# Patient Record
Sex: Female | Born: 1955
Health system: Southern US, Community
[De-identification: ages and names within clinical notes are randomized; demographics above are authoritative.]

## PROBLEM LIST (undated history)

## (undated) DIAGNOSIS — E782 Mixed hyperlipidemia: Secondary | ICD-10-CM

## (undated) DIAGNOSIS — I10 Essential (primary) hypertension: Secondary | ICD-10-CM

## (undated) DIAGNOSIS — M545 Low back pain, unspecified: Secondary | ICD-10-CM

## (undated) DIAGNOSIS — D509 Iron deficiency anemia, unspecified: Secondary | ICD-10-CM

## (undated) DIAGNOSIS — C801 Malignant (primary) neoplasm, unspecified: Secondary | ICD-10-CM

## (undated) DIAGNOSIS — K219 Gastro-esophageal reflux disease without esophagitis: Secondary | ICD-10-CM

## (undated) DIAGNOSIS — K222 Esophageal obstruction: Secondary | ICD-10-CM

## (undated) DIAGNOSIS — F329 Major depressive disorder, single episode, unspecified: Secondary | ICD-10-CM

## (undated) DIAGNOSIS — A048 Other specified bacterial intestinal infections: Secondary | ICD-10-CM

## (undated) DIAGNOSIS — G8929 Other chronic pain: Secondary | ICD-10-CM

## (undated) DIAGNOSIS — K449 Diaphragmatic hernia without obstruction or gangrene: Secondary | ICD-10-CM

## (undated) DIAGNOSIS — E119 Type 2 diabetes mellitus without complications: Secondary | ICD-10-CM

## (undated) DIAGNOSIS — G459 Transient cerebral ischemic attack, unspecified: Secondary | ICD-10-CM

## (undated) DIAGNOSIS — I8390 Asymptomatic varicose veins of unspecified lower extremity: Secondary | ICD-10-CM

## (undated) DIAGNOSIS — F32A Depression, unspecified: Secondary | ICD-10-CM

## (undated) DIAGNOSIS — M1990 Unspecified osteoarthritis, unspecified site: Secondary | ICD-10-CM

## (undated) DIAGNOSIS — F419 Anxiety disorder, unspecified: Secondary | ICD-10-CM

## (undated) DIAGNOSIS — R768 Other specified abnormal immunological findings in serum: Secondary | ICD-10-CM

## (undated) DIAGNOSIS — R109 Unspecified abdominal pain: Secondary | ICD-10-CM

## (undated) HISTORY — DX: Low back pain: M54.5

## (undated) HISTORY — DX: Depression, unspecified: F32.A

## (undated) HISTORY — DX: Type 2 diabetes mellitus without complications: E11.9

## (undated) HISTORY — DX: Diaphragmatic hernia without obstruction or gangrene: K44.9

## (undated) HISTORY — DX: Major depressive disorder, single episode, unspecified: F32.9

## (undated) HISTORY — DX: Asymptomatic varicose veins of unspecified lower extremity: I83.90

## (undated) HISTORY — PX: ESOPHAGOGASTRODUODENOSCOPY: SHX1529

## (undated) HISTORY — DX: Anxiety disorder, unspecified: F41.9

## (undated) HISTORY — DX: Transient cerebral ischemic attack, unspecified: G45.9

## (undated) HISTORY — DX: Esophageal obstruction: K22.2

## (undated) HISTORY — DX: Other specified bacterial intestinal infections: A04.8

## (undated) HISTORY — PX: SPINE SURGERY: SHX786

## (undated) HISTORY — DX: Mixed hyperlipidemia: E78.2

## (undated) HISTORY — DX: Unspecified osteoarthritis, unspecified site: M19.90

## (undated) HISTORY — DX: Iron deficiency anemia, unspecified: D50.9

## (undated) HISTORY — DX: Other specified abnormal immunological findings in serum: R76.8

## (undated) HISTORY — DX: Essential (primary) hypertension: I10

## (undated) HISTORY — DX: Low back pain, unspecified: M54.50

## (undated) HISTORY — DX: Gastro-esophageal reflux disease without esophagitis: K21.9

---

## 1992-10-16 HISTORY — PX: TUBAL LIGATION: SHX77

## 1998-04-12 ENCOUNTER — Ambulatory Visit (HOSPITAL_COMMUNITY): Admission: RE | Admit: 1998-04-12 | Discharge: 1998-04-12 | Payer: Self-pay | Admitting: Neurology

## 2000-05-15 ENCOUNTER — Encounter: Payer: Self-pay | Admitting: Neurosurgery

## 2000-05-15 ENCOUNTER — Ambulatory Visit (HOSPITAL_COMMUNITY): Admission: RE | Admit: 2000-05-15 | Discharge: 2000-05-15 | Payer: Self-pay | Admitting: Neurosurgery

## 2000-05-29 ENCOUNTER — Ambulatory Visit (HOSPITAL_COMMUNITY): Admission: RE | Admit: 2000-05-29 | Discharge: 2000-05-29 | Payer: Self-pay | Admitting: Neurosurgery

## 2000-05-29 ENCOUNTER — Encounter: Payer: Self-pay | Admitting: Neurosurgery

## 2000-06-12 ENCOUNTER — Ambulatory Visit (HOSPITAL_COMMUNITY): Admission: RE | Admit: 2000-06-12 | Discharge: 2000-06-12 | Payer: Self-pay | Admitting: Neurosurgery

## 2000-06-12 ENCOUNTER — Encounter: Payer: Self-pay | Admitting: Neurosurgery

## 2000-07-16 HISTORY — PX: OTHER SURGICAL HISTORY: SHX169

## 2000-07-26 ENCOUNTER — Encounter: Payer: Self-pay | Admitting: Neurosurgery

## 2000-07-26 ENCOUNTER — Emergency Department (HOSPITAL_COMMUNITY): Admission: EM | Admit: 2000-07-26 | Discharge: 2000-07-26 | Payer: Self-pay | Admitting: Emergency Medicine

## 2000-07-26 ENCOUNTER — Encounter: Payer: Self-pay | Admitting: Emergency Medicine

## 2000-07-30 ENCOUNTER — Encounter: Payer: Self-pay | Admitting: Neurosurgery

## 2000-07-30 ENCOUNTER — Observation Stay (HOSPITAL_COMMUNITY): Admission: RE | Admit: 2000-07-30 | Discharge: 2000-07-31 | Payer: Self-pay | Admitting: Neurosurgery

## 2001-05-21 ENCOUNTER — Other Ambulatory Visit: Admission: RE | Admit: 2001-05-21 | Discharge: 2001-05-21 | Payer: Self-pay | Admitting: Internal Medicine

## 2002-01-20 ENCOUNTER — Ambulatory Visit (HOSPITAL_COMMUNITY): Admission: RE | Admit: 2002-01-20 | Discharge: 2002-01-20 | Payer: Self-pay | Admitting: Family Medicine

## 2002-01-20 ENCOUNTER — Encounter: Payer: Self-pay | Admitting: Family Medicine

## 2002-04-22 ENCOUNTER — Ambulatory Visit (HOSPITAL_COMMUNITY): Admission: RE | Admit: 2002-04-22 | Discharge: 2002-04-22 | Payer: Self-pay | Admitting: Family Medicine

## 2002-04-22 ENCOUNTER — Encounter: Payer: Self-pay | Admitting: Family Medicine

## 2003-01-26 ENCOUNTER — Encounter: Payer: Self-pay | Admitting: Family Medicine

## 2003-01-26 ENCOUNTER — Ambulatory Visit (HOSPITAL_COMMUNITY): Admission: RE | Admit: 2003-01-26 | Discharge: 2003-01-26 | Payer: Self-pay | Admitting: Emergency Medicine

## 2004-01-27 ENCOUNTER — Ambulatory Visit (HOSPITAL_COMMUNITY): Admission: RE | Admit: 2004-01-27 | Discharge: 2004-01-27 | Payer: Self-pay | Admitting: Family Medicine

## 2004-09-15 ENCOUNTER — Encounter (INDEPENDENT_AMBULATORY_CARE_PROVIDER_SITE_OTHER): Payer: Self-pay | Admitting: Internal Medicine

## 2004-11-01 ENCOUNTER — Emergency Department (HOSPITAL_COMMUNITY): Admission: EM | Admit: 2004-11-01 | Discharge: 2004-11-01 | Payer: Self-pay | Admitting: Emergency Medicine

## 2005-02-02 ENCOUNTER — Ambulatory Visit (HOSPITAL_COMMUNITY): Admission: RE | Admit: 2005-02-02 | Discharge: 2005-02-02 | Payer: Self-pay | Admitting: Family Medicine

## 2005-04-23 ENCOUNTER — Emergency Department (HOSPITAL_COMMUNITY): Admission: EM | Admit: 2005-04-23 | Discharge: 2005-04-23 | Payer: Self-pay | Admitting: Emergency Medicine

## 2006-01-04 ENCOUNTER — Emergency Department (HOSPITAL_COMMUNITY): Admission: EM | Admit: 2006-01-04 | Discharge: 2006-01-04 | Payer: Self-pay | Admitting: Emergency Medicine

## 2006-01-14 HISTORY — PX: COLONOSCOPY: SHX174

## 2006-01-18 ENCOUNTER — Ambulatory Visit: Payer: Self-pay | Admitting: Internal Medicine

## 2006-01-31 ENCOUNTER — Ambulatory Visit: Payer: Self-pay | Admitting: Internal Medicine

## 2006-01-31 ENCOUNTER — Ambulatory Visit (HOSPITAL_COMMUNITY): Admission: RE | Admit: 2006-01-31 | Discharge: 2006-01-31 | Payer: Self-pay | Admitting: Internal Medicine

## 2006-02-05 ENCOUNTER — Ambulatory Visit (HOSPITAL_COMMUNITY): Admission: RE | Admit: 2006-02-05 | Discharge: 2006-02-05 | Payer: Self-pay | Admitting: Family Medicine

## 2006-06-12 ENCOUNTER — Ambulatory Visit: Payer: Self-pay | Admitting: Internal Medicine

## 2006-06-12 LAB — CONVERTED CEMR LAB
LDL Cholesterol: 128 mg/dL
Total CHOL/HDL Ratio: 4.3
VLDL: 21 mg/dL

## 2006-08-15 ENCOUNTER — Encounter (INDEPENDENT_AMBULATORY_CARE_PROVIDER_SITE_OTHER): Payer: Self-pay | Admitting: Internal Medicine

## 2006-08-15 ENCOUNTER — Emergency Department (HOSPITAL_COMMUNITY): Admission: EM | Admit: 2006-08-15 | Discharge: 2006-08-15 | Payer: Self-pay | Admitting: Emergency Medicine

## 2006-08-15 LAB — CONVERTED CEMR LAB
ALT: 14 units/L
AST: 23 units/L
Albumin: 3.6 g/dL
Alkaline Phosphatase: 131 units/L
BUN: 12 mg/dL
Basophils Absolute: 0 10*3/uL
Basophils Relative: 1 %
CO2: 32 meq/L
Calcium: 9.5 mg/dL
Chloride: 100 meq/L
Creatinine, Ser: 0.9 mg/dL
Eosinophils Absolute: 0.1 10*3/uL
Eosinophils Relative: 1 %
Glucose, Bld: 142 mg/dL
HCT: 37.5 %
Hemoglobin: 12.3 g/dL
Lymphocytes Relative: 21 %
Lymphs Abs: 1 10*3/uL
MCHC: 32.8 g/dL
MCV: 83.3 fL
Monocytes Absolute: 0.3 10*3/uL
Monocytes Relative: 7 %
Neutro Abs: 3.3 10*3/uL
Neutrophils Relative %: 70 %
Platelets: 292 10*3/uL
Potassium: 3.8 meq/L
RBC count: 4.51 10*6/uL
RBC: 4.51 M/uL
RDW: 15 %
Sodium: 138 meq/L
Total Bilirubin: 0.8 mg/dL
Total Protein: 7.5 g/dL
WBC, blood: 4.7 10*3/uL
WBC: 4.7 10*3/uL

## 2006-08-20 ENCOUNTER — Ambulatory Visit: Payer: Self-pay | Admitting: Internal Medicine

## 2006-08-21 ENCOUNTER — Ambulatory Visit (HOSPITAL_COMMUNITY): Admission: RE | Admit: 2006-08-21 | Discharge: 2006-08-21 | Payer: Self-pay | Admitting: Internal Medicine

## 2006-08-22 ENCOUNTER — Ambulatory Visit (HOSPITAL_COMMUNITY): Admission: RE | Admit: 2006-08-22 | Discharge: 2006-08-22 | Payer: Self-pay | Admitting: Internal Medicine

## 2006-09-19 ENCOUNTER — Ambulatory Visit (HOSPITAL_COMMUNITY): Admission: RE | Admit: 2006-09-19 | Discharge: 2006-09-19 | Payer: Self-pay | Admitting: Internal Medicine

## 2006-09-19 ENCOUNTER — Ambulatory Visit: Payer: Self-pay | Admitting: Cardiology

## 2006-09-25 ENCOUNTER — Encounter: Payer: Self-pay | Admitting: Internal Medicine

## 2006-09-25 DIAGNOSIS — E785 Hyperlipidemia, unspecified: Secondary | ICD-10-CM | POA: Insufficient documentation

## 2006-09-25 DIAGNOSIS — K219 Gastro-esophageal reflux disease without esophagitis: Secondary | ICD-10-CM | POA: Insufficient documentation

## 2006-09-25 DIAGNOSIS — M1990 Unspecified osteoarthritis, unspecified site: Secondary | ICD-10-CM | POA: Insufficient documentation

## 2006-09-25 DIAGNOSIS — F329 Major depressive disorder, single episode, unspecified: Secondary | ICD-10-CM

## 2006-09-25 DIAGNOSIS — G8929 Other chronic pain: Secondary | ICD-10-CM | POA: Insufficient documentation

## 2006-09-25 DIAGNOSIS — I10 Essential (primary) hypertension: Secondary | ICD-10-CM | POA: Insufficient documentation

## 2006-09-25 DIAGNOSIS — E1169 Type 2 diabetes mellitus with other specified complication: Secondary | ICD-10-CM | POA: Insufficient documentation

## 2006-09-25 DIAGNOSIS — H409 Unspecified glaucoma: Secondary | ICD-10-CM | POA: Insufficient documentation

## 2006-09-25 DIAGNOSIS — F3289 Other specified depressive episodes: Secondary | ICD-10-CM | POA: Insufficient documentation

## 2006-09-25 DIAGNOSIS — F411 Generalized anxiety disorder: Secondary | ICD-10-CM | POA: Insufficient documentation

## 2007-01-24 ENCOUNTER — Ambulatory Visit: Payer: Self-pay | Admitting: Internal Medicine

## 2007-01-24 DIAGNOSIS — E669 Obesity, unspecified: Secondary | ICD-10-CM

## 2007-01-25 LAB — CONVERTED CEMR LAB
Albumin: 3.9 g/dL (ref 3.5–5.2)
BUN: 11 mg/dL (ref 6–23)
Calcium: 8.9 mg/dL (ref 8.4–10.5)
Chloride: 102 meq/L (ref 96–112)
Glucose, Bld: 100 mg/dL — ABNORMAL HIGH (ref 70–99)
HDL: 37 mg/dL — ABNORMAL LOW (ref >39)
Lymphocytes Relative: 29 % (ref 12–46)
Lymphs Abs: 1.5 10*3/uL (ref 0.7–3.3)
Monocytes Relative: 9 % (ref 3–11)
Neutro Abs: 3.1 10*3/uL (ref 1.7–7.7)
Neutrophils Relative %: 59 % (ref 43–77)
Potassium: 4 meq/L (ref 3.5–5.3)
RBC: 4.34 M/uL (ref 3.87–5.11)
Sodium: 141 meq/L (ref 135–145)
Total Protein: 7.3 g/dL (ref 6.0–8.3)
Triglycerides: 101 mg/dL (ref <150)
WBC: 5.3 10*3/uL (ref 4.0–10.5)

## 2007-02-08 ENCOUNTER — Ambulatory Visit (HOSPITAL_COMMUNITY): Admission: RE | Admit: 2007-02-08 | Discharge: 2007-02-08 | Payer: Self-pay | Admitting: Internal Medicine

## 2007-02-28 ENCOUNTER — Telehealth (INDEPENDENT_AMBULATORY_CARE_PROVIDER_SITE_OTHER): Payer: Self-pay | Admitting: *Deleted

## 2007-03-29 ENCOUNTER — Emergency Department (HOSPITAL_COMMUNITY): Admission: EM | Admit: 2007-03-29 | Discharge: 2007-03-29 | Payer: Self-pay | Admitting: Emergency Medicine

## 2007-04-03 ENCOUNTER — Ambulatory Visit: Payer: Self-pay | Admitting: Internal Medicine

## 2007-05-02 ENCOUNTER — Ambulatory Visit: Payer: Self-pay | Admitting: Internal Medicine

## 2007-08-22 ENCOUNTER — Ambulatory Visit: Payer: Self-pay | Admitting: Internal Medicine

## 2007-08-22 LAB — CONVERTED CEMR LAB
Blood in Urine, dipstick: NEGATIVE
Glucose, Urine, Semiquant: NEGATIVE
Ketones, urine, test strip: NEGATIVE
Protein, U semiquant: NEGATIVE
Specific Gravity, Urine: 1.02
pH: 5.5

## 2007-08-23 ENCOUNTER — Telehealth (INDEPENDENT_AMBULATORY_CARE_PROVIDER_SITE_OTHER): Payer: Self-pay | Admitting: *Deleted

## 2007-08-23 LAB — CONVERTED CEMR LAB
BUN: 13 mg/dL (ref 6–23)
CO2: 26 meq/L (ref 19–32)
Calcium: 9.2 mg/dL (ref 8.4–10.5)
Chloride: 100 meq/L (ref 96–112)
Creatinine, Ser: 0.81 mg/dL (ref 0.40–1.20)
Glucose, Bld: 147 mg/dL — ABNORMAL HIGH (ref 70–99)
Potassium: 4 meq/L (ref 3.5–5.3)
Sodium: 139 meq/L (ref 135–145)

## 2007-09-23 ENCOUNTER — Ambulatory Visit: Payer: Self-pay | Admitting: Internal Medicine

## 2007-10-15 ENCOUNTER — Ambulatory Visit: Payer: Self-pay | Admitting: Internal Medicine

## 2007-10-15 ENCOUNTER — Telehealth (INDEPENDENT_AMBULATORY_CARE_PROVIDER_SITE_OTHER): Payer: Self-pay | Admitting: *Deleted

## 2007-10-22 ENCOUNTER — Ambulatory Visit: Admission: RE | Admit: 2007-10-22 | Discharge: 2007-10-22 | Payer: Self-pay | Admitting: Family Medicine

## 2007-10-22 ENCOUNTER — Encounter (INDEPENDENT_AMBULATORY_CARE_PROVIDER_SITE_OTHER): Payer: Self-pay | Admitting: Internal Medicine

## 2007-10-22 ENCOUNTER — Telehealth (INDEPENDENT_AMBULATORY_CARE_PROVIDER_SITE_OTHER): Payer: Self-pay | Admitting: Internal Medicine

## 2007-11-08 ENCOUNTER — Encounter (INDEPENDENT_AMBULATORY_CARE_PROVIDER_SITE_OTHER): Payer: Self-pay | Admitting: Internal Medicine

## 2007-11-12 ENCOUNTER — Encounter (INDEPENDENT_AMBULATORY_CARE_PROVIDER_SITE_OTHER): Payer: Self-pay | Admitting: Internal Medicine

## 2007-11-15 ENCOUNTER — Ambulatory Visit: Payer: Self-pay | Admitting: Internal Medicine

## 2007-11-15 LAB — CONVERTED CEMR LAB: Rapid Strep: NEGATIVE

## 2007-11-19 ENCOUNTER — Encounter (INDEPENDENT_AMBULATORY_CARE_PROVIDER_SITE_OTHER): Payer: Self-pay | Admitting: Internal Medicine

## 2008-02-11 ENCOUNTER — Ambulatory Visit (HOSPITAL_COMMUNITY): Admission: RE | Admit: 2008-02-11 | Discharge: 2008-02-11 | Payer: Self-pay | Admitting: Internal Medicine

## 2008-02-12 ENCOUNTER — Telehealth (INDEPENDENT_AMBULATORY_CARE_PROVIDER_SITE_OTHER): Payer: Self-pay | Admitting: *Deleted

## 2008-02-13 ENCOUNTER — Ambulatory Visit: Payer: Self-pay | Admitting: Internal Medicine

## 2008-02-14 ENCOUNTER — Telehealth (INDEPENDENT_AMBULATORY_CARE_PROVIDER_SITE_OTHER): Payer: Self-pay | Admitting: *Deleted

## 2008-02-14 LAB — CONVERTED CEMR LAB
ALT: 12 units/L (ref 0–35)
Albumin: 4.2 g/dL (ref 3.5–5.2)
CO2: 29 meq/L (ref 19–32)
Calcium: 8.9 mg/dL (ref 8.4–10.5)
Chloride: 102 meq/L (ref 96–112)
Cholesterol: 196 mg/dL (ref 0–200)
Glucose, Bld: 87 mg/dL (ref 70–99)
Potassium: 4 meq/L (ref 3.5–5.3)
Sodium: 141 meq/L (ref 135–145)
Total Bilirubin: 0.5 mg/dL (ref 0.3–1.2)
Total Protein: 7.8 g/dL (ref 6.0–8.3)
Triglycerides: 112 mg/dL (ref <150)
VLDL: 22 mg/dL (ref 0–40)

## 2008-02-19 ENCOUNTER — Emergency Department (HOSPITAL_COMMUNITY): Admission: EM | Admit: 2008-02-19 | Discharge: 2008-02-19 | Payer: Self-pay | Admitting: Emergency Medicine

## 2008-02-20 ENCOUNTER — Ambulatory Visit: Payer: Self-pay | Admitting: Internal Medicine

## 2008-02-20 ENCOUNTER — Telehealth (INDEPENDENT_AMBULATORY_CARE_PROVIDER_SITE_OTHER): Payer: Self-pay | Admitting: Internal Medicine

## 2008-04-21 ENCOUNTER — Encounter (INDEPENDENT_AMBULATORY_CARE_PROVIDER_SITE_OTHER): Payer: Self-pay | Admitting: Internal Medicine

## 2008-04-22 ENCOUNTER — Telehealth (INDEPENDENT_AMBULATORY_CARE_PROVIDER_SITE_OTHER): Payer: Self-pay | Admitting: *Deleted

## 2008-04-22 ENCOUNTER — Ambulatory Visit: Payer: Self-pay | Admitting: Internal Medicine

## 2008-04-28 ENCOUNTER — Encounter (INDEPENDENT_AMBULATORY_CARE_PROVIDER_SITE_OTHER): Payer: Self-pay | Admitting: Internal Medicine

## 2008-08-11 ENCOUNTER — Ambulatory Visit: Payer: Self-pay | Admitting: Internal Medicine

## 2008-08-26 ENCOUNTER — Ambulatory Visit: Payer: Self-pay | Admitting: Internal Medicine

## 2008-08-27 ENCOUNTER — Encounter (INDEPENDENT_AMBULATORY_CARE_PROVIDER_SITE_OTHER): Payer: Self-pay | Admitting: Internal Medicine

## 2008-08-31 ENCOUNTER — Encounter (INDEPENDENT_AMBULATORY_CARE_PROVIDER_SITE_OTHER): Payer: Self-pay | Admitting: Internal Medicine

## 2008-08-31 LAB — CONVERTED CEMR LAB
AST: 14 units/L (ref 0–37)
Albumin: 3.9 g/dL (ref 3.5–5.2)
BUN: 15 mg/dL (ref 6–23)
CO2: 26 meq/L (ref 19–32)
Calcium: 9.3 mg/dL (ref 8.4–10.5)
Chloride: 102 meq/L (ref 96–112)
Creatinine, Ser: 0.83 mg/dL (ref 0.40–1.20)
Glucose, Bld: 102 mg/dL — ABNORMAL HIGH (ref 70–99)
Potassium: 4.2 meq/L (ref 3.5–5.3)

## 2008-10-05 ENCOUNTER — Ambulatory Visit: Payer: Self-pay | Admitting: Internal Medicine

## 2009-02-12 ENCOUNTER — Ambulatory Visit (HOSPITAL_COMMUNITY): Admission: RE | Admit: 2009-02-12 | Discharge: 2009-02-12 | Payer: Self-pay | Admitting: Internal Medicine

## 2009-03-17 ENCOUNTER — Encounter (INDEPENDENT_AMBULATORY_CARE_PROVIDER_SITE_OTHER): Payer: Self-pay | Admitting: Internal Medicine

## 2009-03-17 ENCOUNTER — Ambulatory Visit: Payer: Self-pay | Admitting: Internal Medicine

## 2009-03-17 ENCOUNTER — Other Ambulatory Visit: Admission: RE | Admit: 2009-03-17 | Discharge: 2009-03-17 | Payer: Self-pay | Admitting: Internal Medicine

## 2009-03-17 DIAGNOSIS — B373 Candidiasis of vulva and vagina: Secondary | ICD-10-CM

## 2009-03-17 DIAGNOSIS — B3731 Acute candidiasis of vulva and vagina: Secondary | ICD-10-CM | POA: Insufficient documentation

## 2009-03-17 LAB — CONVERTED CEMR LAB: OCCULT 1: NEGATIVE

## 2009-03-18 ENCOUNTER — Encounter (INDEPENDENT_AMBULATORY_CARE_PROVIDER_SITE_OTHER): Payer: Self-pay | Admitting: Internal Medicine

## 2009-03-22 ENCOUNTER — Encounter (INDEPENDENT_AMBULATORY_CARE_PROVIDER_SITE_OTHER): Payer: Self-pay | Admitting: Internal Medicine

## 2009-03-22 LAB — CONVERTED CEMR LAB
ALT: 9 units/L (ref 0–35)
AST: 12 units/L (ref 0–37)
Albumin: 3.9 g/dL (ref 3.5–5.2)
Alkaline Phosphatase: 148 units/L — ABNORMAL HIGH (ref 39–117)
BUN: 13 mg/dL (ref 6–23)
Calcium: 9.1 mg/dL (ref 8.4–10.5)
Chloride: 104 meq/L (ref 96–112)
HDL: 46 mg/dL (ref >39)
LDL Cholesterol: 107 mg/dL — ABNORMAL HIGH (ref 0–99)
Potassium: 4.3 meq/L (ref 3.5–5.3)
Total CHOL/HDL Ratio: 3.9

## 2009-03-24 ENCOUNTER — Encounter (INDEPENDENT_AMBULATORY_CARE_PROVIDER_SITE_OTHER): Payer: Self-pay | Admitting: Internal Medicine

## 2009-03-31 ENCOUNTER — Ambulatory Visit: Payer: Self-pay | Admitting: Internal Medicine

## 2009-03-31 DIAGNOSIS — E118 Type 2 diabetes mellitus with unspecified complications: Secondary | ICD-10-CM | POA: Insufficient documentation

## 2009-03-31 DIAGNOSIS — E669 Obesity, unspecified: Secondary | ICD-10-CM

## 2009-03-31 DIAGNOSIS — E1169 Type 2 diabetes mellitus with other specified complication: Secondary | ICD-10-CM

## 2009-03-31 LAB — CONVERTED CEMR LAB
Blood Glucose, Fingerstick: 118
Glucose, Fasting: 100 mg/dL — ABNORMAL HIGH (ref 70–99)
Hgb A1c MFr Bld: 6.3 %

## 2009-04-01 ENCOUNTER — Encounter (INDEPENDENT_AMBULATORY_CARE_PROVIDER_SITE_OTHER): Payer: Self-pay | Admitting: Internal Medicine

## 2009-04-13 ENCOUNTER — Emergency Department (HOSPITAL_COMMUNITY): Admission: EM | Admit: 2009-04-13 | Discharge: 2009-04-13 | Payer: Self-pay | Admitting: Emergency Medicine

## 2009-04-14 ENCOUNTER — Ambulatory Visit: Payer: Self-pay | Admitting: Internal Medicine

## 2009-04-16 ENCOUNTER — Encounter (INDEPENDENT_AMBULATORY_CARE_PROVIDER_SITE_OTHER): Payer: Self-pay | Admitting: Internal Medicine

## 2009-04-22 ENCOUNTER — Ambulatory Visit (HOSPITAL_COMMUNITY): Admission: RE | Admit: 2009-04-22 | Discharge: 2009-04-22 | Payer: Self-pay | Admitting: Internal Medicine

## 2009-04-26 ENCOUNTER — Encounter (INDEPENDENT_AMBULATORY_CARE_PROVIDER_SITE_OTHER): Payer: Self-pay | Admitting: Internal Medicine

## 2009-04-28 ENCOUNTER — Ambulatory Visit: Payer: Self-pay | Admitting: Internal Medicine

## 2009-04-28 DIAGNOSIS — G459 Transient cerebral ischemic attack, unspecified: Secondary | ICD-10-CM | POA: Insufficient documentation

## 2009-05-11 ENCOUNTER — Ambulatory Visit: Payer: Self-pay | Admitting: Internal Medicine

## 2009-05-11 DIAGNOSIS — M25519 Pain in unspecified shoulder: Secondary | ICD-10-CM | POA: Insufficient documentation

## 2009-05-26 ENCOUNTER — Ambulatory Visit: Payer: Self-pay | Admitting: Internal Medicine

## 2009-06-25 ENCOUNTER — Encounter (INDEPENDENT_AMBULATORY_CARE_PROVIDER_SITE_OTHER): Payer: Self-pay | Admitting: Internal Medicine

## 2009-12-01 ENCOUNTER — Encounter: Payer: Self-pay | Admitting: Physician Assistant

## 2009-12-01 ENCOUNTER — Ambulatory Visit: Payer: Self-pay | Admitting: Family Medicine

## 2009-12-01 DIAGNOSIS — K5909 Other constipation: Secondary | ICD-10-CM | POA: Insufficient documentation

## 2009-12-01 DIAGNOSIS — R7309 Other abnormal glucose: Secondary | ICD-10-CM | POA: Insufficient documentation

## 2009-12-06 LAB — CONVERTED CEMR LAB
Glucose, Bld: 112 mg/dL — ABNORMAL HIGH (ref 70–99)
Hgb A1c MFr Bld: 6.6 % — ABNORMAL HIGH (ref 4.6–6.1)
Potassium: 4.4 meq/L (ref 3.5–5.3)
Sodium: 139 meq/L (ref 135–145)

## 2009-12-16 ENCOUNTER — Ambulatory Visit: Payer: Self-pay | Admitting: Family Medicine

## 2009-12-16 DIAGNOSIS — E669 Obesity, unspecified: Secondary | ICD-10-CM | POA: Insufficient documentation

## 2009-12-23 ENCOUNTER — Telehealth: Payer: Self-pay | Admitting: Physician Assistant

## 2009-12-31 ENCOUNTER — Ambulatory Visit: Payer: Self-pay | Admitting: Family Medicine

## 2009-12-31 DIAGNOSIS — G4700 Insomnia, unspecified: Secondary | ICD-10-CM | POA: Insufficient documentation

## 2009-12-31 DIAGNOSIS — R5383 Other fatigue: Secondary | ICD-10-CM | POA: Insufficient documentation

## 2009-12-31 DIAGNOSIS — R5381 Other malaise: Secondary | ICD-10-CM

## 2010-01-03 ENCOUNTER — Encounter: Payer: Self-pay | Admitting: Physician Assistant

## 2010-01-03 LAB — CONVERTED CEMR LAB
HCT: 36.4 % (ref 36.0–46.0)
Hemoglobin: 11.3 g/dL — ABNORMAL LOW (ref 12.0–15.0)
RBC: 4.31 M/uL (ref 3.87–5.11)
Retic Ct Pct: 0.9 % (ref 0.4–3.1)
WBC: 6.8 10*3/uL (ref 4.0–10.5)

## 2010-01-07 ENCOUNTER — Ambulatory Visit: Payer: Self-pay | Admitting: Family Medicine

## 2010-01-07 DIAGNOSIS — D509 Iron deficiency anemia, unspecified: Secondary | ICD-10-CM | POA: Insufficient documentation

## 2010-01-24 ENCOUNTER — Telehealth: Payer: Self-pay | Admitting: Family Medicine

## 2010-01-25 ENCOUNTER — Ambulatory Visit: Payer: Self-pay | Admitting: Family Medicine

## 2010-01-25 DIAGNOSIS — L259 Unspecified contact dermatitis, unspecified cause: Secondary | ICD-10-CM | POA: Insufficient documentation

## 2010-01-25 DIAGNOSIS — J209 Acute bronchitis, unspecified: Secondary | ICD-10-CM | POA: Insufficient documentation

## 2010-01-25 DIAGNOSIS — J019 Acute sinusitis, unspecified: Secondary | ICD-10-CM | POA: Insufficient documentation

## 2010-02-14 ENCOUNTER — Ambulatory Visit (HOSPITAL_COMMUNITY): Admission: RE | Admit: 2010-02-14 | Discharge: 2010-02-14 | Payer: Self-pay | Admitting: Family Medicine

## 2010-03-18 LAB — CONVERTED CEMR LAB
CO2: 28 meq/L (ref 19–32)
Calcium: 9.3 mg/dL (ref 8.4–10.5)
Chloride: 103 meq/L (ref 96–112)
Cholesterol: 140 mg/dL (ref 0–200)
Creatinine, Ser: 0.77 mg/dL (ref 0.40–1.20)
Glucose, Bld: 100 mg/dL — ABNORMAL HIGH (ref 70–99)
HCT: 36.4 % (ref 36.0–46.0)
Hemoglobin: 11.5 g/dL — ABNORMAL LOW (ref 12.0–15.0)
Hgb A1c MFr Bld: 6.3 % — ABNORMAL HIGH (ref <5.7)
MCV: 86.5 fL (ref 78.0–100.0)
RBC: 4.21 M/uL (ref 3.87–5.11)
Total Bilirubin: 0.4 mg/dL (ref 0.3–1.2)
Total CHOL/HDL Ratio: 2.9
Total Protein: 7.2 g/dL (ref 6.0–8.3)
Triglycerides: 62 mg/dL (ref <150)
VLDL: 12 mg/dL (ref 0–40)
Vit D, 25-Hydroxy: 33 ng/mL (ref 30–89)
WBC: 6 10*3/uL (ref 4.0–10.5)

## 2010-03-21 ENCOUNTER — Ambulatory Visit: Payer: Self-pay | Admitting: Family Medicine

## 2010-03-21 LAB — CONVERTED CEMR LAB
Bilirubin Urine: NEGATIVE
Glucose, Urine, Semiquant: NEGATIVE
Ketones, urine, test strip: NEGATIVE
Urobilinogen, UA: 0.2
pH: 5

## 2010-04-14 ENCOUNTER — Emergency Department (HOSPITAL_COMMUNITY): Admission: EM | Admit: 2010-04-14 | Discharge: 2010-04-14 | Payer: Self-pay | Admitting: Emergency Medicine

## 2010-04-14 ENCOUNTER — Encounter: Payer: Self-pay | Admitting: Physician Assistant

## 2010-06-28 ENCOUNTER — Encounter (HOSPITAL_COMMUNITY): Admission: RE | Admit: 2010-06-28 | Discharge: 2010-07-15 | Payer: Self-pay | Admitting: Podiatry

## 2010-06-29 ENCOUNTER — Telehealth: Payer: Self-pay | Admitting: Family Medicine

## 2010-07-02 ENCOUNTER — Emergency Department (HOSPITAL_COMMUNITY): Admission: EM | Admit: 2010-07-02 | Discharge: 2010-07-03 | Payer: Self-pay | Admitting: Emergency Medicine

## 2010-07-19 ENCOUNTER — Encounter (HOSPITAL_COMMUNITY): Admission: RE | Admit: 2010-07-19 | Discharge: 2010-08-18 | Payer: Self-pay | Admitting: Podiatry

## 2010-07-20 ENCOUNTER — Ambulatory Visit: Payer: Self-pay | Admitting: Family Medicine

## 2010-07-20 DIAGNOSIS — B353 Tinea pedis: Secondary | ICD-10-CM | POA: Insufficient documentation

## 2010-07-20 DIAGNOSIS — L293 Anogenital pruritus, unspecified: Secondary | ICD-10-CM | POA: Insufficient documentation

## 2010-07-22 ENCOUNTER — Encounter: Payer: Self-pay | Admitting: Physician Assistant

## 2010-07-22 LAB — CONVERTED CEMR LAB
CO2: 30 meq/L (ref 19–32)
Chloride: 102 meq/L (ref 96–112)
Glucose, Bld: 100 mg/dL — ABNORMAL HIGH (ref 70–99)
MCV: 86.3 fL (ref 78.0–100.0)
Platelets: 293 10*3/uL (ref 150–400)
Potassium: 4.3 meq/L (ref 3.5–5.3)
RBC: 4.51 M/uL (ref 3.87–5.11)
Sodium: 139 meq/L (ref 135–145)
WBC: 7.1 10*3/uL (ref 4.0–10.5)

## 2010-07-25 LAB — CONVERTED CEMR LAB
Cholesterol: 150 mg/dL (ref 0–200)
HDL: 51 mg/dL (ref >39)
LDL Cholesterol: 82 mg/dL (ref 0–99)
Total CHOL/HDL Ratio: 2.9
Triglycerides: 87 mg/dL (ref <150)
VLDL: 17 mg/dL (ref 0–40)

## 2010-07-26 ENCOUNTER — Encounter: Payer: Self-pay | Admitting: Physician Assistant

## 2010-08-08 ENCOUNTER — Encounter: Payer: Self-pay | Admitting: Physician Assistant

## 2010-09-22 ENCOUNTER — Encounter: Payer: Self-pay | Admitting: Family Medicine

## 2010-11-02 ENCOUNTER — Encounter: Payer: Self-pay | Admitting: Family Medicine

## 2010-11-03 ENCOUNTER — Ambulatory Visit
Admission: RE | Admit: 2010-11-03 | Discharge: 2010-11-03 | Payer: Self-pay | Source: Home / Self Care | Attending: Family Medicine | Admitting: Family Medicine

## 2010-11-03 ENCOUNTER — Encounter: Payer: Self-pay | Admitting: Family Medicine

## 2010-11-03 ENCOUNTER — Telehealth: Payer: Self-pay | Admitting: Family Medicine

## 2010-11-03 DIAGNOSIS — R079 Chest pain, unspecified: Secondary | ICD-10-CM | POA: Insufficient documentation

## 2010-11-03 LAB — HM DIABETES FOOT EXAM

## 2010-11-04 ENCOUNTER — Encounter: Payer: Self-pay | Admitting: Family Medicine

## 2010-11-04 LAB — CONVERTED CEMR LAB
ALT: 8 units/L (ref 0–35)
AST: 15 units/L (ref 0–37)
Albumin: 4.1 g/dL (ref 3.5–5.2)
Alkaline Phosphatase: 140 units/L — ABNORMAL HIGH (ref 39–117)
BUN: 11 mg/dL (ref 6–23)
Chloride: 101 meq/L (ref 96–112)
HDL: 48 mg/dL (ref >39)
Helicobacter Pylori Antibody-IgG: 0.7
Hemoglobin: 12 g/dL (ref 12.0–15.0)
LDL Cholesterol: 101 mg/dL — ABNORMAL HIGH (ref 0–99)
MCHC: 31.5 g/dL (ref 30.0–36.0)
Platelets: 285 10*3/uL (ref 150–400)
Potassium: 4.7 meq/L (ref 3.5–5.3)
RDW: 14.6 % (ref 11.5–15.5)
Sodium: 139 meq/L (ref 135–145)

## 2010-11-05 ENCOUNTER — Encounter: Payer: Self-pay | Admitting: Family Medicine

## 2010-11-05 LAB — CONVERTED CEMR LAB
Creatinine, Urine: 221.8 mg/dL
Hgb A1c MFr Bld: 6.1 % — ABNORMAL HIGH
Microalb Creat Ratio: 5.7 mg/g
Microalb, Ur: 1.26 mg/dL

## 2010-11-15 ENCOUNTER — Telehealth: Payer: Self-pay | Admitting: Family Medicine

## 2010-11-15 ENCOUNTER — Encounter: Payer: Self-pay | Admitting: Family Medicine

## 2010-11-15 NOTE — Letter (Signed)
Summary: Letter  Letter   Imported By: Lind Guest 07/26/2010 08:56:52  _____________________________________________________________________  External Attachment:    Type:   Image     Comment:   External Document

## 2010-11-15 NOTE — Assessment & Plan Note (Signed)
Summary: F UP Room 1   Vital Signs:  Patient profile:   55 year old female Menstrual status:  postmenopausal Height:      64 inches Weight:      241.25 pounds BMI:     41.56 O2 Sat:      100 % Pulse rate:   64 / minute Resp:     16 per minute BP sitting:   120 / 78  (left arm) Cuff size:   large  Vitals Entered By: Everitt Amber LPN (July 20, 2010 10:48 AM)  Nutrition Counseling: Patient's BMI is greater than 25 and therefore counseled on weight management options. CC: Follow up visit   CC:  Follow up visit.  History of Present Illness: Pt presents today for check up. Hx of DM.  Taking medications as prescribed and denies side effects. No episodes of hypoglycemia. Checking blood sugar at home.  Fasting blood sugars average 90-100 . Last eye exam UTD Taking ASA daily. Pneumovax UTD.  Requests flu vaccine today.  Hx of htn. Taking medications as prescribed and denies side effects.  No headache, chest pain or palpitations.  Hx of Hyperlipidemia.  Taking medications as prescribed and no side effects.  Following a low fat diet. Exercising regularly with Silver Sneakers.  Fell at [employer] in June.  Hurt Rt ankle.  Is seeing podiatrist.  And is currently in PT.  Chronic itching of external genitalia.  Mycolog II cream only thing that has helped, but has to apply it two times a day every day.  Exam at physical June 2010 neg.  Also c/o dry peeling feet, chronic.  Allergies: No Known Drug Allergies  Past History:  Past medical history reviewed for relevance to current acute and chronic problems.  Past Medical History: Reviewed history from 01/07/2010 and no changes required. Anxiety Depression GERD Hyperlipidemia Hypertension Low back pain Osteoarthritis constipation varicose veins + H. pylori glaucoma--last eye exam Sept 08 hiatal hernia/Schatzki's ring Diabetes mellitus, type II TIA--03/2009 Anemia-iron deficiency  Review of Systems General:  Denies  chills and fever. ENT:  Denies earache, nasal congestion, and sore throat. CV:  Denies chest pain or discomfort, lightheadness, and palpitations. Resp:  Denies cough and shortness of breath. GI:  Denies abdominal pain, change in bowel habits, nausea, and vomiting. Derm:  Complains of dryness and itching.  Physical Exam  General:  Well-developed,well-nourished,in no acute distress; alert,appropriate and cooperative throughout examination Head:  Normocephalic and atraumatic without obvious abnormalities. No apparent alopecia or balding. Ears:  External ear exam shows no significant lesions or deformities.  Otoscopic examination reveals clear canals, tympanic membranes are intact bilaterally without bulging, retraction, inflammation or discharge. Hearing is grossly normal bilaterally. Nose:  External nasal examination shows no deformity or inflammation. Nasal mucosa are pink and moist without lesions or exudates. Mouth:  Oral mucosa and oropharynx without lesions or exudates.   Neck:  No deformities, masses, or tenderness noted. Lungs:  Normal respiratory effort, chest expands symmetrically. Lungs are clear to auscultation, no crackles or wheezes. Heart:  Normal rate and regular rhythm. S1 and S2 normal without gallop, murmur, click, rub or other extra sounds. Msk:  Pt wearing Rt ankle ASO brace Pulses:  R posterior tibial normal, R dorsalis pedis normal, L posterior tibial normal, and L dorsalis pedis normal.   Extremities:  No PTE Neurologic:  alert & oriented X3, sensation intact to light touch, gait normal, and DTRs symmetrical and normal.   Skin:  dry & peeling plantar aspect bilat  feet. Cervical Nodes:  No lymphadenopathy noted Psych:  Cognition and judgment appear intact. Alert and cooperative with normal attention span and concentration. No apparent delusions, illusions, hallucinations  Diabetes Management Exam:    Foot Exam (with socks and/or shoes not present):        Sensory-Monofilament:          Left foot: normal          Right foot: normal       Inspection:          Left foot: normal          Right foot: normal       Nails:          Left foot: normal          Right foot: normal   Impression & Recommendations:  Problem # 1:  DIABETES MELLITUS, TYPE II (ICD-250.00) Assessment Comment Only  Her updated medication list for this problem includes:    Lisinopril 40 Mg Tabs (Lisinopril) .Marland Kitchen... 1 daily    Aspirin 325 Mg Tabs (Aspirin) .Marland Kitchen... 1 by mouth once daily    Metformin Hcl 500 Mg Tabs (Metformin hcl) .Marland Kitchen... 1 daily with food  Orders: T-Comprehensive Metabolic Panel [id number]) T-Helicobacter AB - IgG [phone])  Labs Reviewed: Creat: 0.77 (03/18/2010)    Reviewed HgBA1c results: 6.3 (03/18/2010)  6.6 (12/02/2009)  Problem # 2:  HYPERTENSION (ICD-401.9) Assessment: Comment Only  Her updated medication list for this problem includes:    Lisinopril 40 Mg Tabs (Lisinopril) .Marland Kitchen... 1 daily    Lasix 40 Mg Tabs (Furosemide) ..... Once daily    Amlodipine Besylate 10 Mg Tabs (Amlodipine besylate) .Marland Kitchen... 1 by mouth once daily    Spironolactone 25 Mg Tabs (Spironolactone) .Marland Kitchen... 1 by mouth once daily  BP today: 120/78 Prior BP: 100/70 (03/21/2010)  Labs Reviewed: K+: 4.7 (03/18/2010) Creat: : 0.77 (03/18/2010)   Chol: 140 (03/18/2010)   HDL: 49 (03/18/2010)   LDL: 79 (03/18/2010)   TG: 62 (03/18/2010)  Problem # 3:  HYPERGLYCEMIA (ICD-790.29) Assessment: Comment Only  Her updated medication list for this problem includes:    Metformin Hcl 500 Mg Tabs (Metformin hcl) .Marland Kitchen... 1 daily with food  Labs Reviewed: Creat: 0.77 (03/18/2010)     Problem # 4:  PRURITUS OF GENITAL ORGANS (ICD-698.1) Assessment: Unchanged Discussed with pt my concern of long term daily steroid use to genitalia.  Will refer to GYN for consultation for treatment of chronic pruitus of her genitalia. Orders: Gynecologic Referral (Gyn)  Problem # 5:  TINEA PEDIS  (ICD-110.4) Assessment: New Discussed with pt that the peeling of her feet is caused by a fungal infection.  The Nystatin cream that she is using in her genitalia will also help with this.  To use two times a day x 1-2 weeks. Her updated medication list for this problem includes:    Nystatin-triamcinolone 100000-0.1 Unit/gm-% Crea (Nystatin-triamcinolone) .Marland Kitchen... Apply to rash two times a day until resolved  Complete Medication List: 1)  Lisinopril 40 Mg Tabs (Lisinopril) .Marland Kitchen.. 1 daily 2)  Omeprazole 20 Mg Cpdr (Omeprazole) .... Once daily 3)  Aspirin 325 Mg Tabs (Aspirin) .Marland Kitchen.. 1 by mouth once daily 4)  Lasix 40 Mg Tabs (Furosemide) .... Once daily 5)  Nabumetone 750 Mg Tabs (Nabumetone) .Marland Kitchen.. 1 by mouth two times a day 6)  Amlodipine Besylate 10 Mg Tabs (Amlodipine besylate) .Marland Kitchen.. 1 by mouth once daily 7)  Polyethylene Glycol 3350 Oral Powd (Polyethylene glycol 3350) .Marland Kitchen.. 1 capful in 8 ounces water  once daily 8)  Nystatin-triamcinolone 100000-0.1 Unit/gm-% Crea (Nystatin-triamcinolone) .... Apply to rash two times a day until resolved 9)  Crestor 10 Mg Tabs (Rosuvastatin calcium) .... 1/2 by mouth once daily 10)  Spironolactone 25 Mg Tabs (Spironolactone) .Marland Kitchen.. 1 by mouth once daily 11)  Metformin Hcl 500 Mg Tabs (Metformin hcl) .Marland Kitchen.. 1 daily with food 12)  Ambien 10 Mg Tabs (Zolpidem tartrate) .... Take 1 at bedtime as needed for insomnia 13)  Iron 325 (65 Fe) Mg Tabs (Ferrous sulfate) .... Take 1 daily for anemia  Other Orders: T-Lipid Profile ([id number]) T-CBC No Diff ([id number]) Influenza Vaccine MCR ([address]562)  Patient Instructions: 1)  Please schedule a follow-up appointment in 3 months. 2)  I am referring you to the gynecologist for consultation regarding your persistent itching. 3)  You may use the prescription cream on the bottom of your feet.  Apply two times a day x 1-2 weeks to clear up the dry peeling skin. 4)  Continue your current medications. 5)  Have blood work drawn  fasting in 3 mos before your next appt. 6)  You have received your flu vaccine today. Prescriptions: NYSTATIN-TRIAMCINOLONE 100000-0.1 UNIT/GM-% CREA (NYSTATIN-TRIAMCINOLONE) apply to rash two times a day until resolved  #60 grams x 1   Entered and Authorized by:   Esperanza Sheets PA   Signed by:   Esperanza Sheets PA on 07/20/2010   Method used:   Electronically to        [facility] [phone]* (retail)       [address] [place], [place]  [address]       Ph: [phone]       Fax: [phone]   RxID:   [id number] NYSTATIN-TRIAMCINOLONE 100000-0.1 UNIT/GM-% CREA (NYSTATIN-TRIAMCINOLONE) apply to rash two times a day until resolved  #30 Gram x 1   Entered and Authorized by:   Esperanza Sheets PA   Signed by:   Esperanza Sheets PA on 07/20/2010   Method used:   Electronically to        [facility] [address] [phone]* (retail)       [address] [place], [place]  [address]       Ph: [phone]       Fax: [phone]   RxID:   [id number] POLYETHYLENE GLYCOL 3350  ORAL POWD (POLYETHYLENE GLYCOL 3350) 1 capful in 8 ounces water once daily  #3 mos supply x 3   Entered and Authorized by:   Esperanza Sheets PA   Signed by:   Esperanza Sheets PA on 07/20/2010   Method used:   Printed then faxed to ...       [facility] (mail-order)             , [place]         Ph: [phone]       Fax: [phone]   RxID:   [id number]     Influenza Vaccine    Vaccine Type: Fluvax MCR    Site: left deltoid    Mfr: novartis    Dose: 0.5 ml    Route: IM    Given by: Everitt Amber LPN    Exp. Date: 02/2011    Lot #: [id number]

## 2010-11-15 NOTE — Assessment & Plan Note (Signed)
Summary: office visit   Vital Signs:  Patient profile:   55 year old female Height:      64 inches Weight:      246 pounds BMI:     42.38 O2 Sat:      99 % on Room air Pulse rate:   73 / minute Resp:     16 per minute BP sitting:   122 / 80  (left arm)  Vitals Entered By: Adella Hare LPN (January 07, 2010 11:00 AM) CC: follow up blood pressure Is Patient Diabetic? Yes Did you bring your meter with you today? No Pain Assessment Patient in pain? no        CC:  follow up blood pressure.  History of Present Illness: Pt is here today for follow up. States she is feeling much better than last week. She is sleeping better with Ambien then she has in years, and has made a big difference in how she feels.  She is no longer tired in the daytime.  Pts labs showed that she is anemic. She states she does have a hx of this.  Is not currently taking iron pills. Had a colonoscopy in 2007 which was neg.  She is taking the metformin & doing well.  No GI probs.  Has made some dietary changes to help with blood sugar control and wt loss. Has stopped Diovan.  Current Medications (verified): 1)  Lisinopril 40 Mg Tabs (Lisinopril) .Marland Kitchen.. 1 Daily 2)  Omeprazole 20 Mg Cpdr (Omeprazole) .... Once Daily 3)  Aspirin 325 Mg Tabs (Aspirin) .Marland Kitchen.. 1 By Mouth Once Daily 4)  Lasix 40 Mg Tabs (Furosemide) .... Once Daily 5)  Nabumetone 750 Mg Tabs (Nabumetone) .Marland Kitchen.. 1 By Mouth Two Times A Day 6)  Amlodipine Besylate 10 Mg Tabs (Amlodipine Besylate) .Marland Kitchen.. 1 By Mouth Once Daily 7)  Polyethylene Glycol 3350  Oral Powd (Polyethylene Glycol 3350) .Marland Kitchen.. 1 Capful in 8 Ounces Water Once Daily 8)  Nystatin-Triamcinolone 100000-0.1 Unit/gm-% Crea (Nystatin-Triamcinolone) .... Apply To Rash Two Times A Day Until Resolved 9)  Crestor 10 Mg Tabs (Rosuvastatin Calcium) .... 1/2 By Mouth Once Daily 10)  Spironolactone 25 Mg Tabs (Spironolactone) .Marland Kitchen.. 1 By Mouth Once Daily 11)  Bayer Aspirin 325 Mg Tabs (Aspirin) .... One  Tab By Mouth Once Daily 12)  Metformin Hcl 500 Mg Tabs (Metformin Hcl) .Marland Kitchen.. 1 Daily With Food 13)  Diovan 160 Mg Tabs (Valsartan) .... I Daily For Blood Pressure 14)  Ambien 10 Mg Tabs (Zolpidem Tartrate) .... Take 1 At Bedtime As Needed For Insomnia  Allergies (verified): No Known Drug Allergies  Past History:  Past medical history reviewed for relevance to current acute and chronic problems.  Past Medical History: Anxiety Depression GERD Hyperlipidemia Hypertension Low back pain Osteoarthritis constipation varicose veins + H. pylori glaucoma--last eye exam Sept 08 hiatal hernia/Schatzki's ring Diabetes mellitus, type II TIA--03/2009 Anemia-iron deficiency  Review of Systems General:  Denies fatigue and weakness. CV:  Denies chest pain or discomfort. Resp:  Denies shortness of breath.  Physical Exam  General:  Well-developed,well-nourished,in no acute distress; alert,appropriate and cooperative throughout examination Head:  Normocephalic and atraumatic without obvious abnormalities. No apparent alopecia or balding. Ears:  External ear exam shows no significant lesions or deformities.  Otoscopic examination reveals clear canals, tympanic membranes are intact bilaterally without bulging, retraction, inflammation or discharge. Hearing is grossly normal bilaterally. Nose:  External nasal examination shows no deformity or inflammation. Nasal mucosa are pink and moist without lesions or exudates.  Mouth:  Oral mucosa and oropharynx without lesions or exudates.  Teeth in good repair. Neck:  No deformities, masses, or tenderness noted. Lungs:  Normal respiratory effort, chest expands symmetrically. Lungs are clear to auscultation, no crackles or wheezes. Heart:  Normal rate and regular rhythm. S1 and S2 normal without gallop, murmur, click, rub or other extra sounds. Cervical Nodes:  No lymphadenopathy noted Psych:  Cognition and judgment appear intact. Alert and cooperative with  normal attention span and concentration. No apparent delusions, illusions, hallucinations   Impression & Recommendations:  Problem # 1:  INSOMNIA, CHRONIC (ICD-307.42) Assessment Improved Discussed try to use Ambien as needed instead of nightly.  Problem # 2:  FATIGUE (ICD-780.79) Assessment: Improved Resolved with improved sleep.  Problem # 3:  DIABETES MELLITUS, TYPE II (ICD-250.00) Assessment: Comment Only  The following medications were removed from the medication list:    Diovan 160 Mg Tabs (Valsartan) ..... I daily for blood pressure Her updated medication list for this problem includes:    Lisinopril 40 Mg Tabs (Lisinopril) .Marland Kitchen... 1 daily    Aspirin 325 Mg Tabs (Aspirin) .Marland Kitchen... 1 by mouth once daily    Bayer Aspirin 325 Mg Tabs (Aspirin) ..... One tab by mouth once daily    Metformin Hcl 500 Mg Tabs (Metformin hcl) .Marland Kitchen... 1 daily with food  Orders: T-Comprehensive Metabolic Panel [id number]) T-Lipid Profile [id number]) T- Hemoglobin A1C ([id number])  Problem # 4:  ANEMIA-IRON DEFICIENCY (ICD-280.9) Assessment: Deteriorated  Pt will restart iron daily.  Her updated medication list for this problem includes:    Iron 325 (65 Fe) Mg Tabs (Ferrous sulfate) .Marland Kitchen... Take 1 daily for anemia  Complete Medication List: 1)  Lisinopril 40 Mg Tabs (Lisinopril) .Marland Kitchen.. 1 daily 2)  Omeprazole 20 Mg Cpdr (Omeprazole) .... Once daily 3)  Aspirin 325 Mg Tabs (Aspirin) .Marland Kitchen.. 1 by mouth once daily 4)  Lasix 40 Mg Tabs (Furosemide) .... Once daily 5)  Nabumetone 750 Mg Tabs (Nabumetone) .Marland Kitchen.. 1 by mouth two times a day 6)  Amlodipine Besylate 10 Mg Tabs (Amlodipine besylate) .Marland Kitchen.. 1 by mouth once daily 7)  Polyethylene Glycol 3350 Oral Powd (Polyethylene glycol 3350) .Marland Kitchen.. 1 capful in 8 ounces water once daily 8)  Nystatin-triamcinolone 100000-0.1 Unit/gm-% Crea (Nystatin-triamcinolone) .... Apply to rash two times a day until resolved 9)  Crestor 10 Mg Tabs (Rosuvastatin calcium) .... 1/2  by mouth once daily 10)  Spironolactone 25 Mg Tabs (Spironolactone) .Marland Kitchen.. 1 by mouth once daily 11)  Bayer Aspirin 325 Mg Tabs (Aspirin) .... One tab by mouth once daily 12)  Metformin Hcl 500 Mg Tabs (Metformin hcl) .Marland Kitchen.. 1 daily with food 13)  Ambien 10 Mg Tabs (Zolpidem tartrate) .... Take 1 at bedtime as needed for insomnia 14)  Iron 325 (65 Fe) Mg Tabs (Ferrous sulfate) .... Take 1 daily for anemia  Other Orders: T-Vitamin D (25-Hydroxy) [id number]) T-Iron [id number]) T-CBC No Diff ([id number])  Patient Instructions: 1)  Keep your appt in June. 2)  Continue Glucophage. 3)  Continue to work on your diet & wt loss. 4)  It is important that you exercise regularly at least 20 minutes 5 times a week. If you develop chest pain, have severe difficulty breathing, or feel very tired , stop exercising immediately and seek medical attention. 5)  Start taking 1 iron pill daily. Prescriptions: AMBIEN 10 MG TABS (ZOLPIDEM TARTRATE) take 1 at bedtime as needed for insomnia  #30 x 2   Entered and Authorized by:   Esperanza Sheets PA  Signed by:   Esperanza Sheets PA on 01/07/2010   Method used:   Printed then faxed to ...       [facility] [address] [phone]* (retail)       [address] [place], [place]  [address]       Ph: [phone]       Fax: [phone]   RxID:   [id number] PROMETHAZINE HCL 25 MG TABS (PROMETHAZINE HCL) take 1 every 8 hrs as needed for nausea & vomiting  #30 x 0   Entered and Authorized by:   Esperanza Sheets PA   Signed by:   Esperanza Sheets PA on 01/07/2010   Method used:   Electronically to        [facility] [address] [phone]* (retail)       [address], [place]  [address]       Ph: [phone]       Fax: [phone]   RxID:   [id number]  MED ENTERED IN ERROR AND PHARMACY CONTACTED Adella Hare LPN  January 07, 2010 11:10 AM

## 2010-11-15 NOTE — Assessment & Plan Note (Signed)
Summary: sick- room 2   Vital Signs:  Patient profile:   55 year old female Height:      64 inches Weight:      242.50 pounds BMI:     41.78 O2 Sat:      92 % on Room air Pulse rate:   65 / minute Resp:     16 per minute BP sitting:   120 / 84  (left arm)  Vitals Entered By: Adella Hare LPN (January 25, 2010 9:43 AM) CC: fatigue, body aches, head congestion, sore throat, chills, x 4 days Is Patient Diabetic? No Pain Assessment Patient in pain? no        CC:  fatigue, body aches, head congestion, sore throat, chills, and x 4 days.  History of Present Illness: Pt is here today with c/o sinus congestion, sore throat and cough x 5 days. Sxs are worsening.  She has tried an otc cough med & no help.  Sinus drainage is discolored & is coughing up yellow phlegm. No fever, but has had chills.  Not sleeping well due to cough.  Pt also states she needs a refill of her cream she uses for her rash.  This works well for her. She has a new spot on her Rt abd.  Current Medications (verified): 1)  Lisinopril 40 Mg Tabs (Lisinopril) .Marland Kitchen.. 1 Daily 2)  Omeprazole 20 Mg Cpdr (Omeprazole) .... Once Daily 3)  Aspirin 325 Mg Tabs (Aspirin) .Marland Kitchen.. 1 By Mouth Once Daily 4)  Lasix 40 Mg Tabs (Furosemide) .... Once Daily 5)  Nabumetone 750 Mg Tabs (Nabumetone) .Marland Kitchen.. 1 By Mouth Two Times A Day 6)  Amlodipine Besylate 10 Mg Tabs (Amlodipine Besylate) .Marland Kitchen.. 1 By Mouth Once Daily 7)  Polyethylene Glycol 3350  Oral Powd (Polyethylene Glycol 3350) .Marland Kitchen.. 1 Capful in 8 Ounces Water Once Daily 8)  Nystatin-Triamcinolone 100000-0.1 Unit/gm-% Crea (Nystatin-Triamcinolone) .... Apply To Rash Two Times A Day Until Resolved 9)  Crestor 10 Mg Tabs (Rosuvastatin Calcium) .... 1/2 By Mouth Once Daily 10)  Spironolactone 25 Mg Tabs (Spironolactone) .Marland Kitchen.. 1 By Mouth Once Daily 11)  Bayer Aspirin 325 Mg Tabs (Aspirin) .... One Tab By Mouth Once Daily 12)  Metformin Hcl 500 Mg Tabs (Metformin Hcl) .Marland Kitchen.. 1 Daily With Food 13)   Ambien 10 Mg Tabs (Zolpidem Tartrate) .... Take 1 At Bedtime As Needed For Insomnia 14)  Iron 325 (65 Fe) Mg Tabs (Ferrous Sulfate) .... Take 1 Daily For Anemia  Allergies (verified): No Known Drug Allergies  Past History:  Past medical history reviewed for relevance to current acute and chronic problems.  Past Medical History: Reviewed history from 01/07/2010 and no changes required. Anxiety Depression GERD Hyperlipidemia Hypertension Low back pain Osteoarthritis constipation varicose veins + H. pylori glaucoma--last eye exam Sept 08 hiatal hernia/Schatzki's ring Diabetes mellitus, type II TIA--03/2009 Anemia-iron deficiency  Review of Systems General:  Complains of chills; denies fever. ENT:  Complains of nasal congestion, postnasal drainage, sinus pressure, and sore throat; denies earache. CV:  Denies chest pain or discomfort. Resp:  Complains of cough and sputum productive; denies shortness of breath.  Physical Exam  General:  Well-developed,well-nourished,in no acute distress; alert,appropriate and cooperative throughout examination Head:  Normocephalic and atraumatic without obvious abnormalities. No apparent alopecia or balding. Ears:  External ear exam shows no significant lesions or deformities.  Otoscopic examination reveals clear canals, tympanic membranes are intact bilaterally without bulging, retraction, inflammation or discharge. Hearing is grossly normal bilaterally. Nose:  External nasal examination  shows no deformity or inflammation. Nasal mucosa are pink and moist without lesions or exudates.L maxillary sinus tenderness and R maxillary sinus tenderness.   Mouth:  Oral mucosa and oropharynx without lesions or exudates.   Neck:  No deformities, masses, or tenderness noted. Lungs:  Normal respiratory effort, chest expands symmetrically. Lungs are clear to auscultation, no crackles or wheezes. Heart:  Normal rate and regular rhythm. S1 and S2 normal without  gallop, murmur, click, rub or other extra sounds. Skin:  Rt Abd approx 1 1/2 cm annular mildly erythematous dry lesion.  No central clearing. Cervical Nodes:  No lymphadenopathy noted Psych:  Cognition and judgment appear intact. Alert and cooperative with normal attention span and concentration. No apparent delusions, illusions, hallucinations   Impression & Recommendations:  Problem # 1:  SINUSITIS, ACUTE (ICD-461.9) Assessment New  Her updated medication list for this problem includes:    Zithromax Z-pak 250 Mg Tabs (Azithromycin) .Marland Kitchen... Take once daily as directed    Promethazine-codeine 6.25-10 Mg/6ml Syrp (Promethazine-codeine) .Marland Kitchen... Take 1-2 tsp every 6 hrs as needed for cough  Problem # 2:  ACUTE BRONCHITIS (ICD-466.0) Assessment: New  Her updated medication list for this problem includes:    Zithromax Z-pak 250 Mg Tabs (Azithromycin) .Marland Kitchen... Take once daily as directed    Promethazine-codeine 6.25-10 Mg/78ml Syrp (Promethazine-codeine) .Marland Kitchen... Take 1-2 tsp every 6 hrs as needed for cough  Orders: Depo- Medrol 80mg  (J1040) Admin of Therapeutic Inj  intramuscular or subcutaneous (40981)  Problem # 3:  DERMATITIS (ICD-692.9) Assessment: Unchanged  Problem # 4:  HYPERTENSION (ICD-401.9) Assessment: Comment Only  Her updated medication list for this problem includes:    Lisinopril 40 Mg Tabs (Lisinopril) .Marland Kitchen... 1 daily    Lasix 40 Mg Tabs (Furosemide) ..... Once daily    Amlodipine Besylate 10 Mg Tabs (Amlodipine besylate) .Marland Kitchen... 1 by mouth once daily    Spironolactone 25 Mg Tabs (Spironolactone) .Marland Kitchen... 1 by mouth once daily  BP today: 120/84 Prior BP: 122/80 (01/07/2010)  Labs Reviewed: K+: 4.4 (12/02/2009) Creat: : 0.78 (12/02/2009)   Chol: 181 (03/18/2009)   HDL: 46 (03/18/2009)   LDL: 107 (03/18/2009)   TG: 141 (03/18/2009)  Complete Medication List: 1)  Lisinopril 40 Mg Tabs (Lisinopril) .Marland Kitchen.. 1 daily 2)  Omeprazole 20 Mg Cpdr (Omeprazole) .... Once daily 3)  Aspirin  325 Mg Tabs (Aspirin) .Marland Kitchen.. 1 by mouth once daily 4)  Lasix 40 Mg Tabs (Furosemide) .... Once daily 5)  Nabumetone 750 Mg Tabs (Nabumetone) .Marland Kitchen.. 1 by mouth two times a day 6)  Amlodipine Besylate 10 Mg Tabs (Amlodipine besylate) .Marland Kitchen.. 1 by mouth once daily 7)  Polyethylene Glycol 3350 Oral Powd (Polyethylene glycol 3350) .Marland Kitchen.. 1 capful in 8 ounces water once daily 8)  Nystatin-triamcinolone 100000-0.1 Unit/gm-% Crea (Nystatin-triamcinolone) .... Apply to rash two times a day until resolved 9)  Crestor 10 Mg Tabs (Rosuvastatin calcium) .... 1/2 by mouth once daily 10)  Spironolactone 25 Mg Tabs (Spironolactone) .Marland Kitchen.. 1 by mouth once daily 11)  Bayer Aspirin 325 Mg Tabs (Aspirin) .... One tab by mouth once daily 12)  Metformin Hcl 500 Mg Tabs (Metformin hcl) .Marland Kitchen.. 1 daily with food 13)  Ambien 10 Mg Tabs (Zolpidem tartrate) .... Take 1 at bedtime as needed for insomnia 14)  Iron 325 (65 Fe) Mg Tabs (Ferrous sulfate) .... Take 1 daily for anemia 15)  Zithromax Z-pak 250 Mg Tabs (Azithromycin) .... Take once daily as directed 16)  Promethazine-codeine 6.25-10 Mg/25ml Syrp (Promethazine-codeine) .... Take 1-2 tsp every  6 hrs as needed for cough  Patient Instructions: 1)  Keep your appt in June for your physical.  We will see you sooner if you don't improve. 2)  It is important that you exercise regularly at least 20 minutes 5 times a week. If you develop chest pain, have severe difficulty breathing, or feel very tired , stop exercising immediately and seek medical attention. 3)  You need to lose weight. Consider a lower calorie diet and regular exercise.  4)  Get plenty of rest, drink lots of clear liquids, and use Tylenol or Ibuprofen for fever and comfort. Return in 7-10 days if you're not better:sooner if you're feeling worse. Prescriptions: PROMETHAZINE-CODEINE 6.25-10 MG/5ML SYRP (PROMETHAZINE-CODEINE) take 1-2 tsp every 6 hrs as needed for cough  #6 oz x 0   Entered and Authorized by:   Esperanza Sheets  PA   Signed by:   Esperanza Sheets PA on 01/25/2010   Method used:   Printed then faxed to ...       [facility] [address] [phone]* (retail)       [address] [place], [place]  [address]       Ph: [phone]       Fax: [phone]   RxID:   [id number] ZITHROMAX Z-PAK 250 MG TABS (AZITHROMYCIN) take once daily as directed  #1 pack x 0   Entered and Authorized by:   Esperanza Sheets PA   Signed by:   Esperanza Sheets PA on 01/25/2010   Method used:   Electronically to        [facility] [phone]* (retail)       [address] [place]  [address]       Ph: [phone]       Fax: [phone]   RxID:   [id number] NYSTATIN-TRIAMCINOLONE 100000-0.1 UNIT/GM-% CREA (NYSTATIN-TRIAMCINOLONE) apply to rash two times a day until resolved  #30 grams x 1   Entered and Authorized by:   Esperanza Sheets PA   Signed by:   Esperanza Sheets PA on 01/25/2010   Method used:   Electronically to        [facility] [phone]* (retail)       [address] [place], [place]  [address]       Ph: [phone]       Fax: [phone]   RxID:   [id number]    Medication Administration  Injection # 1:    Medication: Depo- Medrol 80mg     Diagnosis: ACUTE BRONCHITIS (ICD-466.0)    Route: IM    Site: RUOQ gluteus    Exp Date: 11/11    Lot #: [id number]    Mfr: [employer]    Patient tolerated injection without complications    Given by: Adella Hare LPN (January 25, 2010 10:27 AM)  Orders Added: 1)  Est. Patient Level IV [00938] 2)  Depo- Medrol 80mg  [J1040] 3)  Admin of Therapeutic Inj  intramuscular or subcutaneous [18299]

## 2010-11-15 NOTE — Progress Notes (Signed)
Summary: refill on rxs  Phone Note Call from Patient   Summary of Call: needs her rxs sent to [facility]. please call pt at [phone]  cell [phone] Initial call taken by: Rudene Anda,  June 29, 2010 10:52 AM  Follow-up for Phone Call        Rx Called In Follow-up by: Adella Hare LPN,  June 29, 2010 11:34 AM    Prescriptions: CRESTOR 10 MG TABS (ROSUVASTATIN CALCIUM) 1/2 by mouth once daily  #45 x 0   Entered by:   Adella Hare LPN   Authorized by:   Syliva Overman MD   Signed by:   Adella Hare LPN on 19/14/7829   Method used:   Faxed to ...       [facility] (mail-order)             , [place]         Ph: [phone]       Fax: [phone]   RxID:   [id number] SPIRONOLACTONE 25 MG TABS (SPIRONOLACTONE) 1 by mouth once daily  #90 x 0   Entered by:   Adella Hare LPN   Authorized by:   Syliva Overman MD   Signed by:   Adella Hare LPN on 44/12/4740   Method used:   Faxed to ...       [facility] (mail-order)             , [place]         Ph: [phone]       Fax: [phone]   RxID:   [id number] METFORMIN HCL 500 MG TABS (METFORMIN HCL) 1 daily with food  #90 x 0   Entered by:   Adella Hare LPN   Authorized by:   Syliva Overman MD   Signed by:   Adella Hare LPN on 57/32/2025   Method used:   Faxed to ...       [facility] (mail-order)             , [place]         Ph: [phone]       Fax: [phone]   RxID:   [id number] AMLODIPINE BESYLATE 10 MG TABS (AMLODIPINE BESYLATE) 1 by mouth once daily  #90 x 0   Entered by:   Adella Hare LPN   Authorized by:   Syliva Overman MD   Signed by:   Adella Hare LPN on 03/50/0938   Method used:   Faxed to ...       [facility] (mail-order)             , [place]         Ph: [phone]       Fax: [phone]   RxID:   [id number] NABUMETONE 750 MG TABS (NABUMETONE) 1 by mouth two times a day  #180 x 0   Entered by:   Adella Hare LPN   Authorized by:   Syliva Overman MD   Signed by:   Adella Hare LPN on 36/14/4315   Method used:   Faxed to ...       [facility] (mail-order)             , [place]         Ph: [phone]       Fax: [phone]   RxID:   [id number] LASIX 40 MG TABS (FUROSEMIDE) once daily  #90 x 0   Entered by:   Adella Hare LPN  Authorized by:   Syliva Overman MD   Signed by:   Adella Hare LPN on 30/16/0109   Method used:   Faxed to ...       [facility] (mail-order)             , [place]         Ph: [phone]       Fax: [phone]   RxID:   [id number] OMEPRAZOLE 20 MG CPDR (OMEPRAZOLE) once daily  #90 x 0   Entered by:   Adella Hare LPN   Authorized by:   Syliva Overman MD   Signed by:   Adella Hare LPN on 04/10/9484   Method used:   Faxed to ...       [facility] (mail-order)             , [place]         Ph: [phone]       Fax: [phone]   RxID:   [id number] LISINOPRIL 40 MG TABS (LISINOPRIL) 1 daily  #90 x 0   Entered by:   Adella Hare LPN   Authorized by:   Syliva Overman MD   Signed by:   Adella Hare LPN on 58/52/7782   Method used:   Faxed to ...       [facility] (mail-order)             , [place]         Ph: [phone]       Fax: [phone]   RxID:   [id number]

## 2010-11-15 NOTE — Assessment & Plan Note (Signed)
Summary: sick- room 3   Vital Signs:  Patient profile:   55 year old female Height:      64 inches Weight:      246.75 pounds BMI:     42.51 O2 Sat:      98 % on Room air Pulse rate:   73 / minute Resp:     16 per minute BP sitting:   124 / 80  (left arm)  Vitals Entered By: Adella Hare LPN (December 31, 2009 11:38 AM) CC: weak and gets lightheaded Is Patient Diabetic? Yes Pain Assessment Patient in pain? no        CC:  weak and gets lightheaded.  History of Present Illness: Pt c/o feeling weak & dizzy this wk.  Lightheaded, no vertigo. Not orthostatic.No energy. But otherwise is feeling OK.  Pt thought that this might be due to her Diovan or Glucophage so she d/c both last wk.  But hasn't improved.  No head congestion  No cough No chest pain, palp or difficulty breathing. No fever or chlls. Appetitie is normal, no N/V/D. She states though that she does not sleep well.  Hasn't for a long time.  Tosses & turns.  Doesn't sleep more than a couple of hrs at a time.    Current Medications (verified): 1)  Lisinopril 40 Mg Tabs (Lisinopril) .Marland Kitchen.. 1 Daily 2)  Omeprazole 20 Mg Cpdr (Omeprazole) .... Once Daily 3)  Aspirin 325 Mg Tabs (Aspirin) .Marland Kitchen.. 1 By Mouth Once Daily 4)  Lasix 40 Mg Tabs (Furosemide) .... Once Daily 5)  Nabumetone 750 Mg Tabs (Nabumetone) .Marland Kitchen.. 1 By Mouth Two Times A Day 6)  Amlodipine Besylate 10 Mg Tabs (Amlodipine Besylate) .Marland Kitchen.. 1 By Mouth Once Daily 7)  Polyethylene Glycol 3350  Oral Powd (Polyethylene Glycol 3350) .Marland Kitchen.. 1 Capful in 8 Ounces Water Once Daily 8)  Nystatin-Triamcinolone 100000-0.1 Unit/gm-% Crea (Nystatin-Triamcinolone) .... Apply To Rash Two Times A Day Until Resolved 9)  Crestor 10 Mg Tabs (Rosuvastatin Calcium) .... 1/2 By Mouth Once Daily 10)  Spironolactone 25 Mg Tabs (Spironolactone) .Marland Kitchen.. 1 By Mouth Once Daily 11)  Bayer Aspirin 325 Mg Tabs (Aspirin) .... One Tab By Mouth Once Daily 12)  Metformin Hcl 500 Mg Tabs (Metformin Hcl) .Marland Kitchen.. 1  Daily With Food 13)  Diovan 160 Mg Tabs (Valsartan) .... I Daily For Blood Pressure  Allergies (verified): No Known Drug Allergies  Past History:  Past medical history reviewed for relevance to current acute and chronic problems.  Past Medical History: Reviewed history from 04/28/2009 and no changes required. Anxiety Depression GERD Hyperlipidemia Hypertension Low back pain Osteoarthritis constipation varicose veins + H. pylori glaucoma--last eye exam Sept 08 hiatal hernia/Schatzki's ring Diabetes mellitus, type II TIA--03/2009  Review of Systems      See HPI  Physical Exam  General:  Well-developed,well-nourished,in no acute distress; alert,appropriate and cooperative throughout examination Head:  Normocephalic and atraumatic without obvious abnormalities. No apparent alopecia or balding. Ears:  External ear exam shows no significant lesions or deformities.  Otoscopic examination reveals clear canals, tympanic membranes are intact bilaterally without bulging, retraction, inflammation or discharge. Hearing is grossly normal bilaterally. Nose:  External nasal examination shows no deformity or inflammation. Nasal mucosa are pink and moist without lesions or exudates. Mouth:  Oral mucosa and oropharynx without lesions or exudates.   Neck:  No deformities, masses, or tenderness noted.no thyromegaly.   Lungs:  Normal respiratory effort, chest expands symmetrically. Lungs are clear to auscultation, no crackles or wheezes. Heart:  Normal rate and regular rhythm. S1 and S2 normal without gallop, murmur, click, rub or other extra sounds. Abdomen:  Bowel sounds positive,abdomen soft and non-tender without masses, organomegaly or hernias noted. Neurologic:  alert & oriented X3 and gait normal.   Cervical Nodes:  No lymphadenopathy noted Psych:  Cognition and judgment appear intact. Alert and cooperative with normal attention span and concentration. No apparent delusions, illusions,  hallucinations   Impression & Recommendations:  Problem # 1:  HYPERTENSION (ICD-401.9) Assessment Unchanged Will hold Diovan at this time.  BP WNL today.  Her updated medication list for this problem includes:    Lisinopril 40 Mg Tabs (Lisinopril) .Marland Kitchen... 1 daily    Lasix 40 Mg Tabs (Furosemide) ..... Once daily    Amlodipine Besylate 10 Mg Tabs (Amlodipine besylate) .Marland Kitchen... 1 by mouth once daily    Spironolactone 25 Mg Tabs (Spironolactone) .Marland Kitchen... 1 by mouth once daily    Diovan 160 Mg Tabs (Valsartan) ..... I daily for blood pressure  Her updated medication list for this problem includes:    Lisinopril 40 Mg Tabs (Lisinopril) .Marland Kitchen... 1 daily    Lasix 40 Mg Tabs (Furosemide) ..... Once daily    Amlodipine Besylate 10 Mg Tabs (Amlodipine besylate) .Marland Kitchen... 1 by mouth once daily    Spironolactone 25 Mg Tabs (Spironolactone) .Marland Kitchen... 1 by mouth once daily    Diovan 160 Mg Tabs (Valsartan) ..... I daily for blood pressure  BP today: 124/80 Prior BP: 112/78 (12/16/2009)  Labs Reviewed: K+: 4.4 (12/02/2009) Creat: : 0.78 (12/02/2009)   Chol: 181 (03/18/2009)   HDL: 46 (03/18/2009)   LDL: 107 (03/18/2009)   TG: 141 (03/18/2009)  Problem # 2:  FATIGUE (ICD-780.79) Assessment: Deteriorated Question secondary to insomnia.  Orders: T-CBC No Diff ([id number]) T-TSH ([id number])  Problem # 3:  INSOMNIA, CHRONIC (ICD-307.42) Assessment: Unchanged Trial Ambien 10 mg. Will re-eval pt next wk.  Problem # 4:  DIABETES MELLITUS, TYPE II (ICD-250.00) pt to restart Glucophage.  Her updated medication list for this problem includes:    Lisinopril 40 Mg Tabs (Lisinopril) .Marland Kitchen... 1 daily    Aspirin 325 Mg Tabs (Aspirin) .Marland Kitchen... 1 by mouth once daily    Bayer Aspirin 325 Mg Tabs (Aspirin) ..... One tab by mouth once daily    Metformin Hcl 500 Mg Tabs (Metformin hcl) .Marland Kitchen... 1 daily with food    Diovan 160 Mg Tabs (Valsartan) ..... I daily for blood pressure  Complete Medication List: 1)  Lisinopril 40  Mg Tabs (Lisinopril) .Marland Kitchen.. 1 daily 2)  Omeprazole 20 Mg Cpdr (Omeprazole) .... Once daily 3)  Aspirin 325 Mg Tabs (Aspirin) .Marland Kitchen.. 1 by mouth once daily 4)  Lasix 40 Mg Tabs (Furosemide) .... Once daily 5)  Nabumetone 750 Mg Tabs (Nabumetone) .Marland Kitchen.. 1 by mouth two times a day 6)  Amlodipine Besylate 10 Mg Tabs (Amlodipine besylate) .Marland Kitchen.. 1 by mouth once daily 7)  Polyethylene Glycol 3350 Oral Powd (Polyethylene glycol 3350) .Marland Kitchen.. 1 capful in 8 ounces water once daily 8)  Nystatin-triamcinolone 100000-0.1 Unit/gm-% Crea (Nystatin-triamcinolone) .... Apply to rash two times a day until resolved 9)  Crestor 10 Mg Tabs (Rosuvastatin calcium) .... 1/2 by mouth once daily 10)  Spironolactone 25 Mg Tabs (Spironolactone) .Marland Kitchen.. 1 by mouth once daily 11)  Bayer Aspirin 325 Mg Tabs (Aspirin) .... One tab by mouth once daily 12)  Metformin Hcl 500 Mg Tabs (Metformin hcl) .Marland Kitchen.. 1 daily with food 13)  Diovan 160 Mg Tabs (Valsartan) .... I daily for blood pressure 14)  Ambien 10 Mg Tabs (Zolpidem tartrate) .... Take 1 at bedtime as needed for insomnia  Patient Instructions: 1)  Follow up appt in 1 wk. 2)  Take your Glucophage. 3)  Dont take the Diovan. 4)  Try the sleeping pills to help with sleep. Prescriptions: AMBIEN 10 MG TABS (ZOLPIDEM TARTRATE) take 1 at bedtime as needed for insomnia  #30 x 0   Entered and Authorized by:   Esperanza Sheets PA   Signed by:   Adella Hare LPN on 04/54/0981   Method used:   Printed then faxed to ...       [facility] [address] [phone]* (retail)       [address] [place]  [address]       Ph: [phone]       Fax: [phone]   RxID:   [id number]

## 2010-11-15 NOTE — Assessment & Plan Note (Signed)
Summary: follow up - room 3   Vital Signs:  Patient profile:   55 year old female Height:      64 inches Weight:      251.25 pounds BMI:     43.28 O2 Sat:      98 % on Room air Pulse rate:   78 / minute Resp:     16 per minute BP sitting:   112 / 78  (left arm)  Vitals Entered By: Adella Hare LPN (December 17, 2839 10:13 AM)  Nutrition Counseling: Patient's BMI is greater than 25 and therefore counseled on weight management options. CC: follow up Is Patient Diabetic? Yes Pain Assessment Patient in pain? no        CC:  follow up.  History of Present Illness: Pt is here today for follow up.  We changed her BP meds due to ins at her last visit. She states she has not had any probs or side effects with the new meds.  She also had lab work done after her last visit.  She had mildy elevated blood sugars in the past.  She denies excessive thirst or polyuria.  She has not been on any meds for blood sugar.   She also states that her constipation is doing well.  Currently controlled with Miralax.    Current Medications (verified): 1)  Lisinopril 40 Mg Tabs (Lisinopril) .Marland Kitchen.. 1 Daily 2)  Omeprazole 20 Mg Cpdr (Omeprazole) .... Once Daily 3)  Aspirin 325 Mg Tabs (Aspirin) .Marland Kitchen.. 1 By Mouth Once Daily 4)  Lasix 40 Mg Tabs (Furosemide) .... Once Daily 5)  Nabumetone 750 Mg Tabs (Nabumetone) .Marland Kitchen.. 1 By Mouth Two Times A Day 6)  Amlodipine Besylate 10 Mg Tabs (Amlodipine Besylate) .Marland Kitchen.. 1 By Mouth Once Daily 7)  Polyethylene Glycol 3350  Oral Powd (Polyethylene Glycol 3350) .Marland Kitchen.. 1 Capful in 8 Ounces Water Once Daily 8)  Nystatin-Triamcinolone 100000-0.1 Unit/gm-% Crea (Nystatin-Triamcinolone) .... Apply To Rash Two Times A Day Until Resolved 9)  Crestor 10 Mg Tabs (Rosuvastatin Calcium) .... 1/2 By Mouth Once Daily 10)  Spironolactone 25 Mg Tabs (Spironolactone) .Marland Kitchen.. 1 By Mouth Once Daily 11)  Bayer Aspirin 325 Mg Tabs (Aspirin) .... One Tab By Mouth Once Daily  Allergies (verified): No  Known Drug Allergies  Past History:  Past medical history reviewed for relevance to current acute and chronic problems.  Past Medical History: Reviewed history from 04/28/2009 and no changes required. Anxiety Depression GERD Hyperlipidemia Hypertension Low back pain Osteoarthritis constipation varicose veins + H. pylori glaucoma--last eye exam Sept 08 hiatal hernia/Schatzki's ring Diabetes mellitus, type II TIA--03/2009  Review of Systems CV:  Denies chest pain or discomfort, palpitations, and swelling of feet. Resp:  Denies cough and shortness of breath. Endo:  Denies excessive thirst, excessive urination, and polyuria.  Physical Exam  General:  Well-developed,well-nourished,in no acute distress; alert,appropriate and cooperative throughout examination Head:  Normocephalic and atraumatic without obvious abnormalities. No apparent alopecia or balding. Ears:  External ear exam shows no significant lesions or deformities.  Otoscopic examination reveals clear canals, tympanic membranes are intact bilaterally without bulging, retraction, inflammation or discharge. Hearing is grossly normal bilaterally. Nose:  External nasal examination shows no deformity or inflammation. Nasal mucosa are pink and moist without lesions or exudates. Mouth:  Oral mucosa and oropharynx without lesions or exudates.  Teeth in good repair. Neck:  No deformities, masses, or tenderness noted. Lungs:  Normal respiratory effort, chest expands symmetrically. Lungs are clear to auscultation, no crackles  or wheezes. Heart:  Normal rate and regular rhythm. S1 and S2 normal without gallop, murmur, click, rub or other extra sounds. Pulses:  R posterior tibial normal, R dorsalis pedis normal, L posterior tibial normal, and L dorsalis pedis normal.   Extremities:  No clubbing, cyanosis, edema, or deformity noted with normal full range of motion of all joints.   Neurologic:  alert & oriented X3 and sensation intact to  light touch.   Skin:  Intact without suspicious lesions or rashes Cervical Nodes:  No lymphadenopathy noted Psych:  Cognition and judgment appear intact. Alert and cooperative with normal attention span and concentration. No apparent delusions, illusions, hallucinations  Diabetes Management Exam:    Foot Exam (with socks and/or shoes not present):       Sensory-Monofilament:          Left foot: normal          Right foot: normal       Inspection:          Left foot: normal          Right foot: normal       Nails:          Left foot: normal          Right foot: normal   Impression & Recommendations:  Problem # 1:  DIABETES MELLITUS, TYPE II (ICD-250.00) Discussed dx of DM with pt.  Will start metformin.   Pt states her last eye exam was approx 1 yr ago. Discussed that she needs yrly eye exam & to notify her eye dr at her next visit that she is diabetic.  Diabetic information handouts given.  Her updated medication list for this problem includes:    Lisinopril 40 Mg Tabs (Lisinopril) .Marland Kitchen... 1 daily    Aspirin 325 Mg Tabs (Aspirin) .Marland Kitchen... 1 by mouth once daily    Bayer Aspirin 325 Mg Tabs (Aspirin) ..... One tab by mouth once daily    Metformin Hcl 500 Mg Tabs (Metformin hcl) .Marland Kitchen... 1 daily with food  Orders: T-Comprehensive Metabolic Panel [id number]) T-Lipid Profile [id number]) T- Hemoglobin A1C ([id number]) T-Urine Microalbumin w/creat. ratio [id number])  Problem # 2:  HYPERTENSION (ICD-401.9) Assessment: Unchanged Continue Lisinopril daily.  Her updated medication list for this problem includes:    Lisinopril 40 Mg Tabs (Lisinopril) .Marland Kitchen... 1 daily    Lasix 40 Mg Tabs (Furosemide) ..... Once daily    Amlodipine Besylate 10 Mg Tabs (Amlodipine besylate) .Marland Kitchen... 1 by mouth once daily    Spironolactone 25 Mg Tabs (Spironolactone) .Marland Kitchen... 1 by mouth once daily  Orders: T-CBC No Diff ([id number])  BP today: 112/78 Prior BP: 121/80 (12/01/2009)  Labs Reviewed: K+:  4.4 (12/02/2009) Creat: : 0.78 (12/02/2009)   Chol: 181 (03/18/2009)   HDL: 46 (03/18/2009)   LDL: 107 (03/18/2009)   TG: 141 (03/18/2009)  Problem # 3:  CONSTIPATION, CHRONIC (ICD-564.09) Assessment: Improved  Her updated medication list for this problem includes:    Polyethylene Glycol 3350 Oral Powd (Polyethylene glycol 3350) .Marland Kitchen... 1 capful in 8 ounces water once daily  Problem # 4:  OBESITY (ICD-278.00) Assessment: Unchanged  Discussed with pt the importance of healthy diet, exercise & wt loss with diabetes.  Ht: 64 (12/16/2009)   Wt: 251.25 (12/16/2009)   BMI: 43.28 (12/16/2009)  Complete Medication List: 1)  Lisinopril 40 Mg Tabs (Lisinopril) .Marland Kitchen.. 1 daily 2)  Omeprazole 20 Mg Cpdr (Omeprazole) .... Once daily 3)  Aspirin 325 Mg Tabs (Aspirin) .Marland Kitchen.. 1 by mouth once daily  4)  Lasix 40 Mg Tabs (Furosemide) .... Once daily 5)  Nabumetone 750 Mg Tabs (Nabumetone) .Marland Kitchen.. 1 by mouth two times a day 6)  Amlodipine Besylate 10 Mg Tabs (Amlodipine besylate) .Marland Kitchen.. 1 by mouth once daily 7)  Polyethylene Glycol 3350 Oral Powd (Polyethylene glycol 3350) .Marland Kitchen.. 1 capful in 8 ounces water once daily 8)  Nystatin-triamcinolone 100000-0.1 Unit/gm-% Crea (Nystatin-triamcinolone) .... Apply to rash two times a day until resolved 9)  Crestor 10 Mg Tabs (Rosuvastatin calcium) .... 1/2 by mouth once daily 10)  Spironolactone 25 Mg Tabs (Spironolactone) .Marland Kitchen.. 1 by mouth once daily 11)  Bayer Aspirin 325 Mg Tabs (Aspirin) .... One tab by mouth once daily 12)  Metformin Hcl 500 Mg Tabs (Metformin hcl) .Marland Kitchen.. 1 daily with food  Other Orders: T-TSH [id number]) T-Vitamin D (25-Hydroxy) [phone]) Misc. Referral (Misc. Ref)  Patient Instructions: 1)  Please schedule a follow-up appointment in 3 months for physical. 2)  Have lab work drawn in 3 mos, before appt, fasting. 3)  Start Glucophage once daily.  Take it with the largest meal of the day. 4)  It is important that you exercise regularly at least 20  minutes 5 times a week. If you develop chest pain, have severe difficulty breathing, or feel very tired , stop exercising immediately and seek medical attention. 5)  You need to lose weight. Consider a lower calorie diet and regular exercise.  6)  Take an Aspirin every day. Prescriptions: METFORMIN HCL 500 MG TABS (METFORMIN HCL) 1 daily with food  #30 x 3   Entered and Authorized by:   Esperanza Sheets PA   Signed by:   Esperanza Sheets PA on 12/16/2009   Method used:   Electronically to        [facility]. [address] [phone]* (retail)       [address], [place]  [address]       Ph: [phone]       Fax: [phone]   RxID:   [id number]

## 2010-11-15 NOTE — Assessment & Plan Note (Signed)
Summary: physical-room 3   Vital Signs:  Patient profile:   55 year old female Menstrual status:  postmenopausal Height:      64 inches Weight:      241.50 pounds BMI:     41.60 O2 Sat:      100 % on Room air Pulse rate:   70 / minute Resp:     16 per minute BP sitting:   100 / 70  (left arm)  Vitals Entered By: Adella Hare LPN (March 21, 9146 10:24 AM)  Nutrition Counseling: Patient's BMI is greater than 25 and therefore counseled on weight management options. CC: physical Is Patient Diabetic? No Pain Assessment Patient in pain? no      Comments did not bring meds to ov     Menstrual Status postmenopausal Last PAP Result NEGATIVE FOR INTRAEPITHELIAL LESIONS OR MALIGNANCY.   CC:  physical.  History of Present Illness: Pt is here today for a physical. No current complaints or concerns. Hx of DM.  Taking meds as prescribed.  No hypoglycemia. Hx of HTN. Taking meds as prescribed.  No HA, dizziness, chest pain/pressure. Hx of GERD.  Well controlled with meds. No current syptoms Recent labs reviewed with pt.  Uncertain of last Tetnus vaccine. Colonoscopy and EGD UTD. Mammogram 4-11. + [facility] has appt this mos. Goes once a yr. Dental care UTD.  Cleanings every 6 mos.   Allergies (verified): No Known Drug Allergies  Past History:  Past medical, surgical, family and social histories (including risk factors) reviewed for relevance to current acute and chronic problems.  Past Medical History: Reviewed history from 01/07/2010 and no changes required. Anxiety Depression GERD Hyperlipidemia Hypertension Low back pain Osteoarthritis constipation varicose veins + H. pylori glaucoma--last eye exam Sept 08 hiatal hernia/Schatzki's ring Diabetes mellitus, type II TIA--03/2009 Anemia-iron deficiency  Past Surgical History: Reviewed history from 02/20/2008 and no changes required. lumbur DISCETOMY-10/01--Dr. Fabiola Backer Tubal ligation-1994 foot-left cyst  removal-1999 EGD with esophageal dilation--01/2006 colonscopy-01/2006  Family History: Reviewed history from 04/14/2009 and no changes required. father-HTN mother-DM sister x1-healthy daughter-arrhythmia/defibrillator  daughter-scoliosis  Social History: Reviewed history from 01/24/2007 and no changes required. Never Smoked Alcohol use-no Drug use-no widowed-lives with children  Review of Systems General:  Denies chills, fever, and loss of appetite. Eyes:  Denies blurring and double vision. ENT:  Denies decreased hearing, earache, nasal congestion, ringing in ears, and sore throat. CV:  Denies chest pain or discomfort, lightheadness, and palpitations. Resp:  Denies cough and shortness of breath. GI:  Denies abdominal pain, bloody stools, change in bowel habits, constipation, dark tarry stools, diarrhea, indigestion, loss of appetite, nausea, and vomiting. GU:  Denies abnormal vaginal bleeding, dysuria, incontinence, and urinary frequency. Derm:  Complains of rash; USES MYCOLOG II CREAM PRN. Neuro:  Denies headaches, numbness, and tingling. Psych:  Denies anxiety and depression. Allergy:  Complains of seasonal allergies.  Physical Exam  General:  Well-developed,well-nourished,in no acute distress; alert,appropriate and cooperative throughout examination Head:  Normocephalic and atraumatic without obvious abnormalities. No apparent alopecia or balding. Eyes:  pupils equal, pupils round, pupils reactive to light, and no injection.   Ears:  External ear exam shows no significant lesions or deformities.  Otoscopic examination reveals clear canals, tympanic membranes are intact bilaterally without bulging, retraction, inflammation or discharge. Hearing is grossly normal bilaterally. Nose:  External nasal examination shows no deformity or inflammation. Nasal mucosa are pink and moist without lesions or exudates. Mouth:  Oral mucosa and oropharynx without lesions or exudates.  Neck:   No deformities, masses, or tenderness noted.no thyromegaly, no thyroid nodules or tenderness, and no carotid bruits.   Chest Wall:  no deformities and no mass.   Breasts:  No mass, nodules, thickening, tenderness, bulging, retraction, inflamation, nipple discharge or skin changes noted.   Lungs:  Normal respiratory effort, chest expands symmetrically. Lungs are clear to auscultation, no crackles or wheezes. Heart:  Normal rate and regular rhythm. S1 and S2 normal without gallop, murmur, click, rub or other extra sounds. Abdomen:  Bowel sounds positive,abdomen soft and non-tender without masses, organomegaly or hernias noted. Rectal:  No external abnormalities noted. Normal sphincter tone. No rectal masses or tenderness. Genitalia:  Normal introitus for age, no external lesions, no vaginal discharge, mucosa pink and moist, no vaginal or cervical lesions, no vaginal atrophy, no friaility or hemorrhage, normal uterus size and position, no adnexal masses or tenderness Pulses:  R posterior tibial normal, R dorsalis pedis normal, R carotid normal, L posterior tibial normal, L dorsalis pedis normal, and L carotid normal.   Extremities:  No PTE. Neurologic:  alert & oriented X3, sensation intact to light touch, gait normal, and DTRs symmetrical and normal.   Skin:  Intact without suspicious lesions or rashes Cervical Nodes:  No lymphadenopathy noted Axillary Nodes:  No palpable lymphadenopathy Psych:  Cognition and judgment appear intact. Alert and cooperative with normal attention span and concentration. No apparent delusions, illusions, hallucinations   Impression & Recommendations:  Problem # 1:  Preventive Health Care (ICD-V70.0)  Problem # 2:  DIABETES MELLITUS, TYPE II (ICD-250.00) Assessment: Comment Only  The following medications were removed from the medication list:    Bayer Aspirin 325 Mg Tabs (Aspirin) ..... One tab by mouth once daily Her updated medication list for this problem  includes:    Lisinopril 40 Mg Tabs (Lisinopril) .Marland Kitchen... 1 daily    Aspirin 325 Mg Tabs (Aspirin) .Marland Kitchen... 1 by mouth once daily    Metformin Hcl 500 Mg Tabs (Metformin hcl) .Marland Kitchen... 1 daily with food  Orders: T-Basic Metabolic Panel [id number]) T- Hemoglobin A1C [id number])  Labs Reviewed: Creat: 0.78 (12/02/2009)    Reviewed HgBA1c results: 6.6 (12/02/2009)  6.3 (03/31/2009)  Problem # 3:  HYPERTENSION (ICD-401.9) Assessment: Comment Only  Her updated medication list for this problem includes:    Lisinopril 40 Mg Tabs (Lisinopril) .Marland Kitchen... 1 daily    Lasix 40 Mg Tabs (Furosemide) ..... Once daily    Amlodipine Besylate 10 Mg Tabs (Amlodipine besylate) .Marland Kitchen... 1 by mouth once daily    Spironolactone 25 Mg Tabs (Spironolactone) .Marland Kitchen... 1 by mouth once daily  Orders: T-Basic Metabolic Panel [id number]) Urinalysis ([id number])  BP today: 100/70 Prior BP: 120/84 (01/25/2010)  Labs Reviewed: K+: 4.4 (12/02/2009) Creat: : 0.78 (12/02/2009)   Chol: 181 (03/18/2009)   HDL: 46 (03/18/2009)   LDL: 107 (03/18/2009)   TG: 141 (03/18/2009)  Problem # 4:  GERD (ICD-530.81) Assessment: Comment Only  Her updated medication list for this problem includes:    Omeprazole 20 Mg Cpdr (Omeprazole) ..... Once daily  Orders: Hemoccult Guaiac-1 spec.(in office) ([address]70)  Complete Medication List: 1)  Lisinopril 40 Mg Tabs (Lisinopril) .Marland Kitchen.. 1 daily 2)  Omeprazole 20 Mg Cpdr (Omeprazole) .... Once daily 3)  Aspirin 325 Mg Tabs (Aspirin) .Marland Kitchen.. 1 by mouth once daily 4)  Lasix 40 Mg Tabs (Furosemide) .... Once daily 5)  Nabumetone 750 Mg Tabs (Nabumetone) .Marland Kitchen.. 1 by mouth two times a day 6)  Amlodipine Besylate 10 Mg Tabs (Amlodipine besylate) .Marland KitchenMarland KitchenMarland Kitchen 1  by mouth once daily 7)  Polyethylene Glycol 3350 Oral Powd (Polyethylene glycol 3350) .Marland Kitchen.. 1 capful in 8 ounces water once daily 8)  Nystatin-triamcinolone 100000-0.1 Unit/gm-% Crea (Nystatin-triamcinolone) .... Apply to rash two times a day until resolved 9)   Crestor 10 Mg Tabs (Rosuvastatin calcium) .... 1/2 by mouth once daily 10)  Spironolactone 25 Mg Tabs (Spironolactone) .Marland Kitchen.. 1 by mouth once daily 11)  Metformin Hcl 500 Mg Tabs (Metformin hcl) .Marland Kitchen.. 1 daily with food 12)  Ambien 10 Mg Tabs (Zolpidem tartrate) .... Take 1 at bedtime as needed for insomnia 13)  Iron 325 (65 Fe) Mg Tabs (Ferrous sulfate) .... Take 1 daily for anemia  Other Orders: T-CBC No Diff ([id number]) T-Iron ([id number]) Tdap => 81yrs IM (70623) Admin 1st Vaccine (76283) Pneumococcal Vaccine (15176) Admin of Any Addtl Vaccine (16073)  Patient Instructions: 1)  Please schedule a follow-up appointment in 4 months. 2)  It is important that you exercise regularly at least 20 minutes 5 times a week. If you develop chest pain, have severe difficulty breathing, or feel very tired , stop exercising immediately and seek medical attention. 3)  You need to lose weight. Consider a lower calorie diet and regular exercise.  4)  I have ordered blood work for you to have drawn before your next appt.   Immunizations Administered:  Tetanus Vaccine:    Vaccine Type: Tdap    Site: left deltoid    Mfr: GlaxoSmithKline    Dose: 0.5 ml    Route: IM    Given by: Adella Hare LPN    Exp. Date: 01/08/2012    Lot #: [id number]    VIS given: 09/03/07 version given March 21, 2010.  Pneumonia Vaccine:    Vaccine Type: Pneumovax    Site: right deltoid    Mfr: Merck    Dose: 0.5 ml    Route: IM    Given by: Adella Hare LPN    Exp. Date: 10/15/2011    Lot #: [id number]    VIS given: 05/13/96 version given March 21, 2010.   Laboratory Results   Urine Tests  Date/Time Received: March 21, 2010 11:17 AM  Date/Time Reported: March 21, 2010 11:17 AM   Routine Urinalysis   Color: yellow Appearance: Clear Glucose: negative   (Normal Range: Negative) Bilirubin: negative   (Normal Range: Negative) Ketone: negative   (Normal Range: Negative) Spec. Gravity: >=1.030   (Normal Range:  1.003-1.035) Blood: negative   (Normal Range: Negative) pH: 5.0   (Normal Range: 5.0-8.0) Protein: negative   (Normal Range: Negative) Urobilinogen: 0.2   (Normal Range: 0-1) Nitrite: negative   (Normal Range: Negative) Leukocyte Esterace: negative   (Normal Range: Negative)      Stool - Occult Blood Hemmoccult #1: negative Date: 03/21/2010    Laboratory Results   Urine Tests    Routine Urinalysis   Color: yellow Appearance: Clear Glucose: negative   (Normal Range: Negative) Bilirubin: negative   (Normal Range: Negative) Ketone: negative   (Normal Range: Negative) Spec. Gravity: >=1.030   (Normal Range: 1.003-1.035) Blood: negative   (Normal Range: Negative) pH: 5.0   (Normal Range: 5.0-8.0) Protein: negative   (Normal Range: Negative) Urobilinogen: 0.2   (Normal Range: 0-1) Nitrite: negative   (Normal Range: Negative) Leukocyte Esterace: negative   (Normal Range: Negative)

## 2010-11-15 NOTE — Letter (Signed)
Summary: Letter to [facility] appt  Letter to [facility] appt   Imported By: Lind Guest 09/22/2010 12:40:05  _____________________________________________________________________  External Attachment:    Type:   Image     Comment:   External Document

## 2010-11-15 NOTE — Progress Notes (Signed)
  Phone Note Call from Patient   Caller: Patient Summary of Call: patient states lisinopril makes her wheeze and cough Initial call taken by: Adella Hare LPN,  December 23, 2009 11:21 AM  Follow-up for Phone Call        I changed her Rx to Diovan.  D/C Lisinopril. If she is having shortness of breath, or if sxs don't improve in the next week she needs an appt.  She will need an appt in 4 weeks to check blood pressure to make sure the medication is working well for her. Follow-up by: Esperanza Sheets PA,  December 23, 2009 11:31 AM  Additional Follow-up for Phone Call Additional follow up Details #1::        patient aware Additional Follow-up by: Adella Hare LPN,  December 23, 2009 1:48 PM    New/Updated Medications: DIOVAN 160 MG TABS (VALSARTAN) i daily for blood pressure Prescriptions: DIOVAN 160 MG TABS (VALSARTAN) i daily for blood pressure  #30 x 1   Entered and Authorized by:   Esperanza Sheets PA   Signed by:   Esperanza Sheets PA on 12/23/2009   Method used:   Electronically to        [facility]. [address] [phone]* (retail)       [address], [place]  [address]       Ph: [phone]       Fax: [phone]   RxID:   [id number]

## 2010-11-15 NOTE — Progress Notes (Signed)
Summary: SCRATCHY THROAT  Phone Note Call from Patient   Summary of Call: SCRATCHY THROAT NO FEVER LITTLE RUNNY NOSE WANTS SOMETHING CALLED IN AT [facility]  CALL BACK AT [phone] CELL   [phone] Initial call taken by: Lind Guest,  January 24, 2010 1:42 PM  Follow-up for Phone Call        appt with dawn tomorrow, patient aware Follow-up by: Adella Hare LPN,  January 24, 2010 4:01 PM

## 2010-11-15 NOTE — Progress Notes (Signed)
Summary: eye exam  eye exam   Imported By: Lind Guest 04/14/2010 15:59:14  _____________________________________________________________________  External Attachment:    Type:   Image     Comment:   External Document

## 2010-11-15 NOTE — Letter (Signed)
Summary: ADD ON LAB  ADD ON LAB   Imported By: Lind Guest 08/10/2010 13:55:32  _____________________________________________________________________  External Attachment:    Type:   Image     Comment:   External Document

## 2010-11-15 NOTE — Assessment & Plan Note (Signed)
Summary: new patient - room 3   Vital Signs:  Patient profile:   55 year old female Height:      64 inches Weight:      248.75 pounds BMI:     42.85 O2 Sat:      99 % on Room air Pulse rate:   85 / minute BP sitting:   121 / 80  (left arm)  Vitals Entered By: Adella Hare LPN (December 01, 2009 10:11 AM) CC: new patient Is Patient Diabetic? No Pain Assessment Patient in pain? no        CC:  new patient.  History of Present Illness: Pt here to establish with a new PCP.  Needs some prescriptions refilled.  Hx of HTN.  Controlled & no problems with medications.  States need to change Benicar though because of ins.   Refill nystatin cream.  Uses as needed in the genital area.    Hx of chronic constipation.  Miralax, uses as needed, & works well for her.  Colonoscopy done in past. Pt uncertain when & will check old records at home.  May be close to 10 yrs ago.  Also hx of GERD.  Well controlled on meds.  Pap & Mamm < 1 yr per pt.  Mamm will be due in April 2011.  Current Medications (verified): 1)  Benicar 40 Mg Tabs (Olmesartan Medoxomil) .Marland Kitchen.. 1 By Mouth Once Daily 2)  Omeprazole 20 Mg Cpdr (Omeprazole) .... Once Daily 3)  Aspirin 325 Mg Tabs (Aspirin) .Marland Kitchen.. 1 By Mouth Once Daily 4)  Lasix 40 Mg Tabs (Furosemide) .... Once Daily 5)  Nabumetone 750 Mg Tabs (Nabumetone) .Marland Kitchen.. 1 By Mouth Two Times A Day 6)  Amlodipine Besylate 10 Mg Tabs (Amlodipine Besylate) .Marland Kitchen.. 1 By Mouth Once Daily 7)  Polyethylene Glycol 3350  Oral Powd (Polyethylene Glycol 3350) .Marland Kitchen.. 1 Capful in 8 Ounces Water Once Daily 8)  Nystatin-Triamcinolone 100000-0.1 Unit/gm-% Crea (Nystatin-Triamcinolone) .... Apply To Rash Two Times A Day Until Resolved 9)  Crestor 10 Mg Tabs (Rosuvastatin Calcium) .... 1/2 By Mouth Once Daily 10)  Spironolactone 25 Mg Tabs (Spironolactone) .Marland Kitchen.. 1 By Mouth Once Daily 11)  Bayer Aspirin 325 Mg Tabs (Aspirin) .... One Tab By Mouth Once Daily  Allergies (verified): No Known Drug  Allergies  Past History:  Past medical, surgical, family and social histories (including risk factors) reviewed, and no changes noted (except as noted below).  Past Medical History: Reviewed history from 04/28/2009 and no changes required. Anxiety Depression GERD Hyperlipidemia Hypertension Low back pain Osteoarthritis constipation varicose veins + H. pylori glaucoma--last eye exam Sept 08 hiatal hernia/Schatzki's ring Diabetes mellitus, type II TIA--03/2009  Past Surgical History: Reviewed history from 02/20/2008 and no changes required. lumbur DISCETOMY-10/01--Dr. Fabiola Backer Tubal ligation-1994 foot-left cyst removal-1999 EGD with esophageal dilation--01/2006 colonscopy-01/2006  Family History: Reviewed history from 04/14/2009 and no changes required. father-77-HTN mother-73-DM sister x1-healthy daughter-30-arrhythmia/defibrillator  daughter-14-scoliosis  Social History: Reviewed history from 01/24/2007 and no changes required. Never Smoked Alcohol use-no Drug use-no widowed-lives with children  Review of Systems General:  Denies chills, fatigue, and fever. Eyes:  Denies blurring, double vision, and eye pain. ENT:  Denies difficulty swallowing, earache, nasal congestion, ringing in ears, and sore throat. CV:  Denies chest pain or discomfort, fatigue, lightheadness, palpitations, shortness of breath with exertion, and swelling of feet. Resp:  Denies chest discomfort, cough, and shortness of breath. GI:  Complains of constipation; denies abdominal pain, nausea, and vomiting.  Physical Exam  General:  Well-developed,well-nourished,in  no acute distress; alert,appropriate and cooperative throughout examination Head:  Normocephalic and atraumatic without obvious abnormalities. No apparent alopecia or balding. Ears:  External ear exam shows no significant lesions or deformities.  Otoscopic examination reveals clear canals, tympanic membranes are intact bilaterally without  bulging, retraction, inflammation or discharge. Hearing is grossly normal bilaterally. Nose:  External nasal examination shows no deformity or inflammation. Nasal mucosa are pink and moist without lesions or exudates. Mouth:  Oral mucosa and oropharynx without lesions or exudates.  Teeth in good repair. Neck:  No deformities, masses, or tenderness noted. Lungs:  Normal respiratory effort, chest expands symmetrically. Lungs are clear to auscultation, no crackles or wheezes. Heart:  Normal rate and regular rhythm. S1 and S2 normal without gallop, murmur, click, rub or other extra sounds. Abdomen:  Bowel sounds positive,abdomen soft and non-tender without masses, organomegaly or hernias noted. Extremities:  No clubbing, cyanosis, edema, or deformity noted with normal full range of motion of all joints.   Skin:  Intact without suspicious lesions or rashes Cervical Nodes:  No lymphadenopathy noted Psych:  Cognition and judgment appear intact. Alert and cooperative with normal attention span and concentration. No apparent delusions, illusions, hallucinations   Impression & Recommendations:  Problem # 1:  HYPERTENSION (ICD-401.9)  Her updated medication list for this problem includes:    Lisinopril 40 Mg Tabs (Lisinopril) .Marland Kitchen... 1 daily    Lasix 40 Mg Tabs (Furosemide) ..... Once daily    Amlodipine Besylate 10 Mg Tabs (Amlodipine besylate) .Marland Kitchen... 1 by mouth once daily    Spironolactone 25 Mg Tabs (Spironolactone) .Marland Kitchen... 1 by mouth once daily    BP today: 121/80 Prior BP: 144/90 (05/26/2009)  Labs Reviewed: K+: 4.3 (03/18/2009) Creat: : 0.77 (03/18/2009)   Chol: 181 (03/18/2009)   HDL: 46 (03/18/2009)   LDL: 107 (03/18/2009)   TG: 141 (03/18/2009)  Problem # 2:  HYPERGLYCEMIA (ICD-790.29)  Labs Reviewed: Creat: 0.77 (03/18/2009)     Orders: T-Basic Metabolic Panel [phone]) T- Hemoglobin A1C ([id number])  Problem # 3:  CONSTIPATION, CHRONIC (ICD-564.09)  Her updated medication  list for this problem includes:    Polyethylene Glycol 3350 Oral Powd (Polyethylene glycol 3350) .Marland Kitchen... 1 capful in 8 ounces water once daily  Problem # 4:  GERD (ICD-530.81)  Her updated medication list for this problem includes:    Omeprazole 20 Mg Cpdr (Omeprazole) ..... Once daily  Problem # 5:  CANDIDIASIS, VULVAR (ICD-112.1) Assessment: Comment Only  Her updated medication list for this problem includes:    Nystatin-triamcinolone 100000-0.1 Unit/gm-% Crea (Nystatin-triamcinolone) .Marland Kitchen... Apply to rash two times a day until resolved  Complete Medication List: 1)  Lisinopril 40 Mg Tabs (Lisinopril) .Marland Kitchen.. 1 daily 2)  Omeprazole 20 Mg Cpdr (Omeprazole) .... Once daily 3)  Aspirin 325 Mg Tabs (Aspirin) .Marland Kitchen.. 1 by mouth once daily 4)  Lasix 40 Mg Tabs (Furosemide) .... Once daily 5)  Nabumetone 750 Mg Tabs (Nabumetone) .Marland Kitchen.. 1 by mouth two times a day 6)  Amlodipine Besylate 10 Mg Tabs (Amlodipine besylate) .Marland Kitchen.. 1 by mouth once daily 7)  Polyethylene Glycol 3350 Oral Powd (Polyethylene glycol 3350) .Marland Kitchen.. 1 capful in 8 ounces water once daily 8)  Nystatin-triamcinolone 100000-0.1 Unit/gm-% Crea (Nystatin-triamcinolone) .... Apply to rash two times a day until resolved 9)  Crestor 10 Mg Tabs (Rosuvastatin calcium) .... 1/2 by mouth once daily 10)  Spironolactone 25 Mg Tabs (Spironolactone) .Marland Kitchen.. 1 by mouth once daily 11)  Bayer Aspirin 325 Mg Tabs (Aspirin) .... One tab by mouth once  daily  Patient Instructions: 1)  Please schedule a follow-up appointment in 2 weeks. 2)  It is important that you exercise regularly at least 20 minutes 5 times a week. If you develop chest pain, have severe difficulty breathing, or feel very tired , stop exercising immediately and seek medical attention. 3)  You need to lose weight. Consider a lower calorie diet and regular exercise.  4)  Check your Blood Pressure regularly. If it is above: you should make an appointment. 5)  BMP prior to visit, 6)  HbgA1C prior  to visit. Prescriptions: NYSTATIN-TRIAMCINOLONE 100000-0.1 UNIT/GM-% CREA (NYSTATIN-TRIAMCINOLONE) apply to rash two times a day until resolved  #30 grams x 1   Entered and Authorized by:   Esperanza Sheets PA   Signed by:   Esperanza Sheets PA on 12/01/2009   Method used:   Electronically to        [facility] [address] [phone]* (retail)       [address] [place], [place]  [address]       Ph: [phone]       Fax: [phone]   RxID:   [id number] LISINOPRIL 40 MG TABS (LISINOPRIL) 1 daily  #90 x 1   Entered and Authorized by:   Esperanza Sheets PA   Signed by:   Esperanza Sheets PA on 12/01/2009   Method used:   Electronically to        [facility] [address] [phone]* (retail)       [address] [place], [place]  [address]       Ph: [phone]       Fax: [phone]   RxID:   [id number] POLYETHYLENE GLYCOL 3350  ORAL POWD (POLYETHYLENE GLYCOL 3350) 1 capful in 8 ounces water once daily  #427 gm x 5   Entered by:   Adella Hare LPN   Authorized by:   Esperanza Sheets PA   Signed by:   Adella Hare LPN on 16/10/930   Method used:   Electronically to        [facility] [address] [phone]* (retail)       [address] [place], [place]  [address]       Ph: [phone]       Fax: [phone]   RxID:   [id number]

## 2010-11-17 NOTE — Letter (Signed)
Summary: dose increase  dose increase   Imported By: Lind Guest 11/03/2010 11:30:28  _____________________________________________________________________  External Attachment:    Type:   Image     Comment:   External Document

## 2010-11-17 NOTE — Progress Notes (Signed)
Summary: change pharm  Phone Note Call from Patient   Summary of Call: wants powder for bowels sent to [facility] [phone] Initial call taken by: Rudene Anda,  November 03, 2010 11:04 AM  Follow-up for Phone Call        patient [facility] Follow-up by: Adella Hare LPN,  November 03, 2010 1:41 PM

## 2010-11-17 NOTE — Letter (Signed)
Summary: lab add on  lab add on   Imported By: Luann Bullins 11/07/2010 08:44:43  _____________________________________________________________________  External Attachment:    Type:   Image     Comment:   External Document

## 2010-11-22 ENCOUNTER — Encounter: Payer: Self-pay | Admitting: Family Medicine

## 2010-11-23 NOTE — Progress Notes (Signed)
Summary: refill  Phone Note Call from Patient   Summary of Call: [facility] Fax [phone]   [phone] Initial call taken by: Rudene Anda,  November 15, 2010 1:52 PM    Prescriptions: AMBIEN 10 MG TABS (ZOLPIDEM TARTRATE) take 1 at bedtime as needed for insomnia  #90 x 0   Entered by:   Adella Hare LPN   Authorized by:   Syliva Overman MD   Signed by:   Adella Hare LPN on 95/62/1308   Method used:   Print then Give to Patient   RxID:   [id number]

## 2010-11-23 NOTE — Letter (Signed)
[facility]   Imported By: Lind Guest 11/15/2010 13:20:10  _____________________________________________________________________  External Attachment:    Type:   Image     Comment:   External Document

## 2010-12-01 NOTE — Assessment & Plan Note (Signed)
Summary: office visit   Vital Signs:  Patient profile:   55 year old female Menstrual status:  postmenopausal Height:      64 inches Weight:      241 pounds BMI:     41.52 O2 Sat:      97 % Pulse rate:   64 / minute Pulse rhythm:   regular Resp:     16 per minute BP sitting:   150 / 92  (left arm) Cuff size:   large  Vitals Entered By: Everitt Amber LPN (November 03, 2010 8:57 AM)  Nutrition Counseling: Patient's BMI is greater than 25 and therefore counseled on weight management options. CC: Follow up chronic problems   CC:  Follow up chronic problems.  History of Present Illness: Reports  that she has been doing fairly well. Denies recent fever or chills. Denies sinus pressure, nasal congestion , ear pain or sore throat. Denies chest congestion, or cough productive of sputum. Deniespalpitations, PND, orthopnea or leg swelling. Denies abdominal pain, nausea, vomitting, diarrhea or constipation. Denies change in bowel movements or bloody stool. Denies dysuria , frequency, incontinence or hesitancy. Denies  joint pain, swelling, or reduced mobility. Denies headaches, vertigo, seizures. Denies depression, anxiety or insomnia.      Current Medications (verified): 1)  Lisinopril 40 Mg Tabs (Lisinopril) .Marland Kitchen.. 1 Daily 2)  Omeprazole 20 Mg Cpdr (Omeprazole) .... Once Daily 3)  Aspirin 325 Mg Tabs (Aspirin) .Marland Kitchen.. 1 By Mouth Once Daily 4)  Lasix 40 Mg Tabs (Furosemide) .... Once Daily 5)  Nabumetone 750 Mg Tabs (Nabumetone) .Marland Kitchen.. 1 By Mouth Two Times A Day 6)  Amlodipine Besylate 10 Mg Tabs (Amlodipine Besylate) .Marland Kitchen.. 1 By Mouth Once Daily 7)  Polyethylene Glycol 3350  Oral Powd (Polyethylene Glycol 3350) .Marland Kitchen.. 1 Capful in 8 Ounces Water Once Daily 8)  Nystatin-Triamcinolone 100000-0.1 Unit/gm-% Crea (Nystatin-Triamcinolone) .... Apply To Rash Two Times A Day Until Resolved 9)  Crestor 10 Mg Tabs (Rosuvastatin Calcium) .... 1/2 By Mouth Once Daily 10)  Spironolactone 25 Mg Tabs  (Spironolactone) .Marland Kitchen.. 1 By Mouth Once Daily 11)  Metformin Hcl 500 Mg Tabs (Metformin Hcl) .Marland Kitchen.. 1 Daily With Food 12)  Ambien 10 Mg Tabs (Zolpidem Tartrate) .... Take 1 At Bedtime As Needed For Insomnia 13)  Iron 325 (65 Fe) Mg Tabs (Ferrous Sulfate) .... Take 1 Daily For Anemia  Allergies (verified): 1)  * Lisinopril  Past History:  Past medical, surgical, family and social histories (including risk factors) reviewed, and no changes noted (except as noted below).  Past Medical History: Reviewed history from 01/07/2010 and no changes required. Anxiety Depression GERD Hyperlipidemia Hypertension Low back pain Osteoarthritis constipation varicose veins + H. pylori glaucoma--last eye exam Sept 08 hiatal hernia/Schatzki's ring Diabetes mellitus, type II TIA--03/2009 Anemia-iron deficiency  Past Surgical History: Reviewed history from 02/20/2008 and no changes required. lumbur DISCETOMY-10/01--Dr. Fabiola Backer Tubal ligation-1994 foot-left cyst removal-1999 EGD with esophageal dilation--01/2006 colonscopy-01/2006  Family History: Reviewed history from 03/21/2010 and no changes required. father-HTN mother-DM sister x1-healthy daughter-arrhythmia/defibrillator  daughter-scoliosis  Social History: Reviewed history from 01/24/2007 and no changes required. Never Smoked Alcohol use-no Drug use-no widowed-lives with children  Review of Systems      See HPI General:  Complains of fatigue. Eyes:  Complains of vision loss-both eyes; denies blurring, discharge, eye pain, and red eye; glaucom. CV:  Complains of chest pain or discomfort; denies palpitations, shortness of breath with exertion, and swelling of feet; intermittent left chest pain, non radiaiing, no associated nausea, diaphoresis  or lightheadedness. GI:  Complains of abdominal pain; geRD. Derm:  Complains of itching and rash; tinea pedis. Endo:  Denies excessive thirst and excessive urination. Heme:  Denies abnormal  bruising and bleeding. Allergy:  Denies hives or rash and itching eyes.  Physical Exam  General:  Well-developed,obese no acute distress; alert,appropriate and cooperative throughout examination HEENT: No facial asymmetry,  EOMI, No sinus tenderness, TM's Clear, oropharynx  pink and moist.   Chest: Clear to auscultation bilaterally.  CVS: S1, S2, No murmurs, No S3.   Abd: Soft, Nontender.  MS: Adequate ROM spine, hips, shoulders and knees.  Ext: No edema.   CNS: CN 2-12 intact, power tone and sensation normal throughout.   Skin: Intact, no visible lesions or rashes.  Psych: Good eye contact, normal affect.  Memory intact, not anxious or depressed appearing.   Diabetes Management Exam:    Foot Exam (with socks and/or shoes not present):       Sensory-Monofilament:          Left foot: normal          Right foot: normal       Inspection:          Left foot: abnormal             Comments: tinea pedis          Right foot: abnormal             Comments: tinea pedis       Nails:          Left foot: thickened          Right foot: thickened   Impression & Recommendations:  Problem # 1:  DIABETES MELLITUS, TYPE II (ICD-250.00) Assessment Comment Only  The following medications were removed from the medication list:    Lisinopril 40 Mg Tabs (Lisinopril) .Marland Kitchen... 1 daily    Diovan 80 Mg Tabs (Valsartan) .Marland Kitchen... Take 1 tablet by mouth once a day Her updated medication list for this problem includes:    Aspirin 325 Mg Tabs (Aspirin) .Marland Kitchen... 1 by mouth once daily    Metformin Hcl 500 Mg Tabs (Metformin hcl) .Marland Kitchen... 1 daily with food    Losartan Potassium 50 Mg Tabs (Losartan potassium) .Marland Kitchen... Take 1 tablet by mouth once a day  Orders: T-Urine Microalbumin w/creat. ratio [phone])  Labs Reviewed: Creat: 0.88 (07/19/2010)    Reviewed HgBA1c results: 6.4 (07/19/2010)  6.3 (03/18/2010)  Problem # 2:  CHEST PAIN UNSPECIFIED (ICD-786.50) Assessment: Comment Only  Orders: EKG w/  Interpretation (93000), nsr, no acute ischemia  Problem # 3:  HYPERTENSION (ICD-401.9) Assessment: Deteriorated  The following medications were removed from the medication list:    Lisinopril 40 Mg Tabs (Lisinopril) .Marland Kitchen... 1 daily    Diovan 80 Mg Tabs (Valsartan) .Marland Kitchen... Take 1 tablet by mouth once a day Her updated medication list for this problem includes:    Lasix 40 Mg Tabs (Furosemide) ..... Once daily    Amlodipine Besylate 10 Mg Tabs (Amlodipine besylate) .Marland Kitchen... 1 by mouth once daily    Spironolactone 25 Mg Tabs (Spironolactone) .Marland Kitchen... 1 by mouth once daily    Losartan Potassium 50 Mg Tabs (Losartan potassium) .Marland Kitchen... Take 1 tablet by mouth once a day  Orders: Medicare Electronic Prescription [phone])  BP today: 150/92 Prior BP: 120/78 (07/20/2010)  Labs Reviewed: K+: 4.3 (07/19/2010) Creat: : 0.88 (07/19/2010)   Chol: 150 (07/22/2010)   HDL: 51 (07/22/2010)   LDL: 82 (07/22/2010)   TG: 87 (07/22/2010)  Problem # 4:  HYPERLIPIDEMIA (ICD-272.4) Assessment: Comment Only  The following.lip1  medications were removed from the medication list:    Crestor 10 Mg Tabs (Rosuvastatin calcium) .Marland Kitchen... 1/2 by mouth once daily Her updated medication list for this problem includes:    Crestor 10 Mg Tabs (Rosuvastatin calcium) .Marland Kitchen... Take 1 tab by mouth at bedtime dose inc effective 11/03/2010, pt will finish the 5mg  tabs taking 2 at night  Labs Reviewed: SGOT: 13 (03/18/2010)   SGPT: 8 (03/18/2010)   HDL:51 (07/22/2010), 49 (03/18/2010)  LDL:82 (07/22/2010), 79 (03/18/2010)  Chol:150 (07/22/2010), 140 (03/18/2010)  Trig:87 (07/22/2010), 62 (03/18/2010)  Complete Medication List: 1)  Omeprazole 20 Mg Cpdr (Omeprazole) .... Once daily 2)  Aspirin 325 Mg Tabs (Aspirin) .Marland Kitchen.. 1 by mouth once daily 3)  Lasix 40 Mg Tabs (Furosemide) .... Once daily 4)  Nabumetone 750 Mg Tabs (Nabumetone) .Marland Kitchen.. 1 by mouth two times a day 5)  Amlodipine Besylate 10 Mg Tabs (Amlodipine besylate) .Marland Kitchen.. 1 by mouth once  daily 6)  Polyethylene Glycol 3350 Oral Powd (Polyethylene glycol 3350) .Marland Kitchen.. 1 capful in 8 ounces water once daily 7)  Nystatin-triamcinolone 100000-0.1 Unit/gm-% Crea (Nystatin-triamcinolone) .... Apply to rash two times a day until resolved 8)  Spironolactone 25 Mg Tabs (Spironolactone) .Marland Kitchen.. 1 by mouth once daily 9)  Metformin Hcl 500 Mg Tabs (Metformin hcl) .Marland Kitchen.. 1 daily with food 10)  Ambien 10 Mg Tabs (Zolpidem tartrate) .... Take 1 at bedtime as needed for insomnia 11)  Iron 325 (65 Fe) Mg Tabs (Ferrous sulfate) .... Take 1 daily for anemia 12)  Crestor 10 Mg Tabs (Rosuvastatin calcium) .... Take 1 tab by mouth at bedtime dose inc effective 11/03/2010, pt will finish the 5mg  tabs taking 2 at night 13)  Losartan Potassium 50 Mg Tabs (Losartan potassium) .... Take 1 tablet by mouth once a day  Patient Instructions: 1)  F/U in 5 to 6 weeks. 2)  Dose increase on crestor, pls take total of 10mg  at night, two 5mg  tabs. 3)  New med for blood pressure, since it is [place]. 4)  Pls change your eating and start regular daily exercise. 5)  Your EKG shows no signs of acute heart damage Prescriptions: LOSARTAN POTASSIUM 50 MG TABS (LOSARTAN POTASSIUM) Take 1 tablet by mouth once a day  #30 x 0   Entered by:   Adella Hare LPN   Authorized by:   Syliva Overman MD   Signed by:   Adella Hare LPN on 91/47/8295   Method used:   Electronically to        [facility] [address] [phone]* (retail)       [address] [place], [place]  [address]       Ph: [phone]       Fax: [phone]   RxID:   [id number] POLYETHYLENE GLYCOL 3350  ORAL POWD (POLYETHYLENE GLYCOL 3350) 1 capful in 8 ounces water once daily  #3 mos supply x 1   Entered by:   Adella Hare LPN   Authorized by:   Syliva Overman MD   Signed by:   Adella Hare LPN on 63/87/5643   Method used:   Electronically to        [facility] [address] [phone]* (retail)       [address] [place]  [address]       Ph:  [phone]       Fax: [phone]  RxID:   [id number] LOSARTAN POTASSIUM 50 MG TABS (LOSARTAN POTASSIUM) Take 1 tablet by mouth once a day  #90 x 1   Entered and Authorized by:   Syliva Overman MD   Signed by:   Syliva Overman MD on 11/03/2010   Method used:   Printed then faxed to ...       [facility] [address] [phone]* (retail)       [address] [place], [place]  [address]       Ph: [phone]       Fax: [phone]   RxID:   [id number] CRESTOR 10 MG TABS (ROSUVASTATIN CALCIUM) Take 1 tab by mouth at bedtime dose inc effective 11/03/2010, pt will finish the 5mg  tabs taking 2 at night  #90 x 1   Entered and Authorized by:   Syliva Overman MD   Signed by:   Syliva Overman MD on 11/03/2010   Method used:   Printed then faxed to ...       [facility] [address] [phone]* (retail)       [address] [place], [place]  [address]       Ph: [phone]       Fax: [phone]   RxID:   [id number] DIOVAN 80 MG TABS (VALSARTAN) Take 1 tablet by mouth once a day  #30 x 2   Entered and Authorized by:   Syliva Overman MD   Signed by:   Syliva Overman MD on 11/03/2010   Method used:   Print then Give to Patient   RxID:   [id number] DIOVAN 80 MG TABS (VALSARTAN) Take 1 tablet by mouth once a day  #30 x 2   Entered and Authorized by:   Syliva Overman MD   Signed by:   Syliva Overman MD on 11/03/2010   Method used:   Electronically to        [facility] [address] [phone]* (retail)       [address] [place], [place]  [address]       Ph: [phone]       Fax: [phone]   RxID:   [id number]    Orders Added: 1)  Est. Patient Level IV [82423] 2)  EKG w/ Interpretation [93000] 3)  Medicare Electronic Prescription [G8553] 4)  T-Urine Microalbumin w/creat. ratio [[id number]]

## 2010-12-01 NOTE — Letter (Signed)
[facility]   Imported By: Lind Guest 11/22/2010 14:35:16  _____________________________________________________________________  External Attachment:    Type:   Image     Comment:   External Document

## 2010-12-08 ENCOUNTER — Telehealth (INDEPENDENT_AMBULATORY_CARE_PROVIDER_SITE_OTHER): Payer: Self-pay | Admitting: *Deleted

## 2010-12-08 DIAGNOSIS — R1319 Other dysphagia: Secondary | ICD-10-CM | POA: Insufficient documentation

## 2010-12-08 DIAGNOSIS — R109 Unspecified abdominal pain: Secondary | ICD-10-CM | POA: Insufficient documentation

## 2010-12-09 ENCOUNTER — Ambulatory Visit (HOSPITAL_COMMUNITY)
Admission: RE | Admit: 2010-12-09 | Discharge: 2010-12-09 | Disposition: A | Discharge status: Home / Self Care | Payer: Medicare HMO | Source: Ambulatory Visit | Attending: Family Medicine | Admitting: Family Medicine

## 2010-12-09 ENCOUNTER — Ambulatory Visit (INDEPENDENT_AMBULATORY_CARE_PROVIDER_SITE_OTHER): Payer: Medicare HMO | Admitting: Family Medicine

## 2010-12-09 ENCOUNTER — Other Ambulatory Visit: Payer: Self-pay | Admitting: Family Medicine

## 2010-12-09 ENCOUNTER — Encounter: Payer: Self-pay | Admitting: Family Medicine

## 2010-12-09 DIAGNOSIS — R1319 Other dysphagia: Secondary | ICD-10-CM

## 2010-12-09 DIAGNOSIS — R1900 Intra-abdominal and pelvic swelling, mass and lump, unspecified site: Secondary | ICD-10-CM

## 2010-12-09 DIAGNOSIS — K219 Gastro-esophageal reflux disease without esophagitis: Secondary | ICD-10-CM

## 2010-12-09 DIAGNOSIS — K429 Umbilical hernia without obstruction or gangrene: Secondary | ICD-10-CM | POA: Insufficient documentation

## 2010-12-09 DIAGNOSIS — K8020 Calculus of gallbladder without cholecystitis without obstruction: Secondary | ICD-10-CM | POA: Insufficient documentation

## 2010-12-09 DIAGNOSIS — R109 Unspecified abdominal pain: Secondary | ICD-10-CM

## 2010-12-09 DIAGNOSIS — R141 Gas pain: Secondary | ICD-10-CM | POA: Insufficient documentation

## 2010-12-09 DIAGNOSIS — E119 Type 2 diabetes mellitus without complications: Secondary | ICD-10-CM

## 2010-12-09 DIAGNOSIS — R142 Eructation: Secondary | ICD-10-CM | POA: Insufficient documentation

## 2010-12-09 LAB — CONVERTED CEMR LAB
AST: 18 units/L (ref 0–37)
Albumin: 4 g/dL (ref 3.5–5.2)
Alkaline Phosphatase: 130 units/L — ABNORMAL HIGH (ref 39–117)
Bilirubin, Direct: 0.2 mg/dL (ref 0.0–0.3)
Glucose, Bld: 100 mg/dL — ABNORMAL HIGH (ref 70–99)
Helicobacter Pylori Antibody-IgG: 0.71
Indirect Bilirubin: 0.5 mg/dL (ref 0.0–0.9)
OCCULT 1: NEGATIVE
Potassium: 4.8 meq/L (ref 3.5–5.3)
Sodium: 137 meq/L (ref 135–145)
Total Bilirubin: 0.7 mg/dL (ref 0.3–1.2)

## 2010-12-09 MED ORDER — IOHEXOL 300 MG/ML  SOLN
100.0000 mL | Freq: Once | INTRAMUSCULAR | Status: AC | PRN
  Administered 2010-12-09: 100 mL via INTRAVENOUS

## 2010-12-13 ENCOUNTER — Encounter: Payer: Self-pay | Admitting: Family Medicine

## 2010-12-13 NOTE — Progress Notes (Signed)
Summary: referral  Phone Note Call from Patient   Summary of Call: needs a referral to dr. Karilyn Cota for food not going down. and stomach pain. [phone] Initial call taken by: Rudene Anda,  December 08, 2010 9:19 AM  Follow-up for Phone Call        pl;s refer to dr Renae Fickle eval abd pain and dysphagia Follow-up by: Syliva Overman MD,  December 08, 2010 6:26 PM  Additional Follow-up for Phone Call Additional follow up Details #1::        pt was referred to dr. Cathie Beams office. pt was told in office today that they would call her with appt and time.  Additional Follow-up by: Rudene Anda,  December 09, 2010 1:43 PM  New Problems: ABDOMINAL PAIN (ICD-789.00) OTHER DYSPHAGIA (ICD-787.29)   New Problems: ABDOMINAL PAIN (ICD-789.00) OTHER DYSPHAGIA (ICD-787.29)

## 2010-12-14 ENCOUNTER — Ambulatory Visit: Payer: Self-pay | Admitting: Family Medicine

## 2010-12-22 NOTE — Assessment & Plan Note (Signed)
Summary: OFFICE VISIT   Vital Signs:  Patient profile:   55 year old female Menstrual status:  postmenopausal Height:      64 inches Weight:      239.75 pounds BMI:     41.30 O2 Sat:      96 % Pulse rate:   87 / minute Pulse rhythm:   regular Resp:     16 per minute BP sitting:   132 / 90  (left arm) Cuff size:   large  Vitals Entered By: Everitt Amber LPN (December 09, 2010 9:04 AM)  Nutrition Counseling: Patient's BMI is greater than 25 and therefore counseled on weight management options. CC: has been having upper abdominal pain, terrible indigestion/heartburn, feels like rocks are in her stomach. Worse at night x 2 weeks   CC:  has been having upper abdominal pain, terrible indigestion/heartburn, and feels like rocks are in her stomach. Worse at night x 2 weeks.  History of Present Illness: Pt in tearful and anxious, stating she has "never felt this badly"Her symptoms are of generalised abdominal pain with constiopation and nausea.She denies fever , chills, hematuria or flank pain. She reports her pain to be mostly in the right upper and lower quadrants.  Allergies: 1)  * Lisinopril  Review of Systems      See HPI Eyes:  Denies discharge. ENT:  Denies nasal congestion, postnasal drainage, and sinus pressure. CV:  Denies chest pain or discomfort and palpitations. Resp:  Denies cough and sputum productive. GI:  Complains of abdominal pain, constipation, and nausea; denies vomiting; 2 week  h/o generalised abdominal and pelvic pain, change in bowel movements stringy stool x 2 weeks, worse constipation, pain worse in LUQ and LLQ, worse at night breaking oput in sweats.Foamy white thick foam coming up in throat. MS:  Complains of joint pain, low back pain, mid back pain, and stiffness. Derm:  Denies itching and rash. Neuro:  Denies headaches and seizures. Psych:  Complains of anxiety. Endo:  Denies cold intolerance, excessive hunger, excessive thirst, and excessive  urination. Heme:  Denies abnormal bruising and bleeding. Allergy:  Denies hives or rash.  Physical Exam  General:  Well-developedobese,in no acute distress; alert,appropriate and cooperative throughout examination.Anxious and at times tearful, pt in pain HEENT: No facial asymmetry,  EOMI, No sinus tenderness, TM's Clear, oropharynx  pink and moist.   Chest: Clear to auscultation bilaterally.  CVS: S1, S2, No murmurs, No S3.   WUJ:WJXBJ tendernes, worse in LUIQ and RLQ. No guarding or rebound, possible mass in RUQ Recral soft brown guaic neg stool MS: Adequate ROM spine, hips, shoulders and knees.  Ext: No edema.   CNS: CN 2-12 intact, power tone and sensation normal throughout.   Skin: Intact, no visible lesions or rashes.  Psych: Good eye contact, normal affect.  Memory intact,  anxious not depressed appearing.    Impression & Recommendations:  Problem # 1:  UMBILICAL HERNIA (ICD-553.1) Assessment Comment Only  Orders: Surgical Referral (Surgery)  Problem # 2:  CHOLELITHIASIS (ICD-574.20) Assessment: Comment Only  Orders: Surgical Referral (Surgery)  Problem # 3:  ABDOMINAL MASS (ICD-789.30) Assessment: Comment Only  Orders: Radiology Referral (Radiology)  Problem # 4:  ABDOMINAL PAIN (ICD-789.00) Assessment: Deteriorated  Orders: T-Basic Metabolic Panel [phone]) T-Hepatic Function [phone]) T-Amylase [phone]) T-Lipase [phone]) TLB-H. Pylori Abs(Helicobacter Pylori) (86677-HELICO) Hemoccult Guaiac-1 spec.(in office) (64403) Radiology Referral (Radiology)  Problem # 5:  OTHER DYSPHAGIA (KVQ-259.56) Assessment: Deteriorated already has a GI referral  Problem # 6:  DIABETES  MELLITUS, TYPE II (ICD-250.00) Assessment: Improved  Her updated medication list for this problem includes:    Aspirin 325 Mg Tabs (Aspirin) .Marland Kitchen... 1 by mouth once daily    Metformin Hcl 500 Mg Tabs (Metformin hcl) .Marland Kitchen... 1 daily with food    Losartan Potassium 50 Mg  Tabs (Losartan potassium) .Marland Kitchen... Take 1 tablet by mouth once a day  Orders: T- Hemoglobin A1C ([id number])  Labs Reviewed: Creat: 0.84 (11/02/2010)    Reviewed HgBA1c results: 6.1 (11/04/2010)  6.4 (07/19/2010)  Problem # 7:  HYPERTENSION (ICD-401.9) Assessment: Improved  Her updated medication list for this problem includes:    Lasix 40 Mg Tabs (Furosemide) ..... Once daily    Amlodipine Besylate 10 Mg Tabs (Amlodipine besylate) .Marland Kitchen... 1 by mouth once daily    Spironolactone 25 Mg Tabs (Spironolactone) .Marland Kitchen... 1 by mouth once daily    Losartan Potassium 50 Mg Tabs (Losartan potassium) .Marland Kitchen... Take 1 tablet by mouth once a day  BP today: 132/90 Prior BP: 150/92 (11/03/2010)  Labs Reviewed: K+: 4.7 (11/02/2010) Creat: : 0.84 (11/02/2010)   Chol: 164 (11/02/2010)   HDL: 48 (11/02/2010)   LDL: 101 (11/02/2010)   TG: 76 (11/02/2010)  Problem # 8:  GERD (ICD-530.81) Assessment: Deteriorated  The following medications were removed from the medication list:    Omeprazole 20 Mg Cpdr (Omeprazole) ..... Once daily Her updated medication list for this problem includes:    Omeprazole 20 Mg Cpdr (Omeprazole) .Marland Kitchen... Take 1 capsule by mouth two times a day dose increase effective 12/09/2010  Orders: Medicare Electronic Prescription [phone])  Complete Medication List: 1)  Aspirin 325 Mg Tabs (Aspirin) .Marland Kitchen.. 1 by mouth once daily 2)  Lasix 40 Mg Tabs (Furosemide) .... Once daily 3)  Nabumetone 750 Mg Tabs (Nabumetone) .Marland Kitchen.. 1 by mouth two times a day 4)  Amlodipine Besylate 10 Mg Tabs (Amlodipine besylate) .Marland Kitchen.. 1 by mouth once daily 5)  Polyethylene Glycol 3350 Oral Powd (Polyethylene glycol 3350) .Marland Kitchen.. 1 capful in 8 ounces water once daily 6)  Nystatin-triamcinolone 100000-0.1 Unit/gm-% Crea (Nystatin-triamcinolone) .... Apply to rash two times a day until resolved 7)  Spironolactone 25 Mg Tabs (Spironolactone) .Marland Kitchen.. 1 by mouth once daily 8)  Metformin Hcl 500 Mg Tabs (Metformin hcl) .Marland Kitchen.. 1 daily  with food 9)  Ambien 10 Mg Tabs (Zolpidem tartrate) .... Take 1 at bedtime as needed for insomnia 10)  Iron 325 (65 Fe) Mg Tabs (Ferrous sulfate) .... Take 1 daily for anemia 11)  Crestor 10 Mg Tabs (Rosuvastatin calcium) .... Take 1 tab by mouth at bedtime dose inc effective 11/03/2010, pt will finish the 5mg  tabs taking 2 at night 12)  Losartan Potassium 50 Mg Tabs (Losartan potassium) .... Take 1 tablet by mouth once a day 13)  Omeprazole 20 Mg Cpdr (Omeprazole) .... Take 1 capsule by mouth two times a day dose increase effective 12/09/2010  Patient Instructions: 1)  Please schedule a follow-up appointment in 2 months. 2)  It is important that you exercise regularly at least 20 minutes 5 times a week. If you develop chest pain, have severe difficulty breathing, or feel very tired , stop exercising immediately and seek medical attention. 3)  You need to lose weight. Consider a lower calorie diet and regular exercise.  4)  HbgA1C prior to visit, ICD-9:  april 20 or after 5)  DOSE inc on omeprazole for reflux effective today. 6)  Reduce nicotine use to help reflux. 7)  BMP prior to visit, ICD-9: 8)  Hepatic Panel  prior to visit, ICD-9:, amylase, lipase and h pylori today.eval abdominal pain. 9)  You are referred for a scan of your abdomen and pelvis Prescriptions: OMEPRAZOLE 20 MG CPDR (OMEPRAZOLE) Take 1 capsule by mouth two times a day dose increase effective 12/09/2010  #180 x 3   Entered and Authorized by:   Syliva Overman MD   Signed by:   Syliva Overman MD on 12/09/2010   Method used:   Printed then faxed to ...       [facility] [address] [phone]* (retail)       [address] [place], [place]  [address]       Ph: [phone]       Fax: [phone]   RxID:   [id number]    Orders Added: 1)  Est. Patient Level IV [99214] 2)  T- Hemoglobin A1C [83036-23375] 3)  T-Basic Metabolic Panel [80048-22910] 4)  T-Hepatic Function [80076-22960] 5)  T-Amylase [82150-23210] 6)   T-Lipase [83690-23215] 7)  TLB-H. Pylori Abs(Helicobacter Pylori) [86677-HELICO] 8)  Hemoccult Guaiac-1 spec.(in office) [82270] 9)  Radiology Referral [Radiology] 10)  Surgical Referral [Surgery] 11)  Medicare Electronic Prescription [G8553]    Laboratory Results  Date/Time Received: December 09, 2010 10:06 AM  Date/Time Reported: December 09, 2010 10:07 AM   Stool - Occult Blood Hemmoccult #1: negative Date: 12/09/2010 Comments: 5030 5/14 51301 13L 10/13 Adella Hare LPN  December 09, 2010 10:07 AM

## 2010-12-23 ENCOUNTER — Other Ambulatory Visit: Payer: Self-pay | Admitting: General Surgery

## 2010-12-23 ENCOUNTER — Encounter (HOSPITAL_COMMUNITY): Payer: Medicare HMO | Attending: General Surgery

## 2010-12-23 DIAGNOSIS — Z01812 Encounter for preprocedural laboratory examination: Secondary | ICD-10-CM | POA: Insufficient documentation

## 2010-12-23 LAB — CBC
HCT: 35.2 % — ABNORMAL LOW (ref 36.0–46.0)
Hemoglobin: 11.3 g/dL — ABNORMAL LOW (ref 12.0–15.0)
RBC: 4.13 MIL/uL (ref 3.87–5.11)
WBC: 5.3 10*3/uL (ref 4.0–10.5)

## 2010-12-23 LAB — BASIC METABOLIC PANEL
BUN: 11 mg/dL (ref 6–23)
Calcium: 9.3 mg/dL (ref 8.4–10.5)
Creatinine, Ser: 0.86 mg/dL (ref 0.4–1.2)
GFR calc non Af Amer: >60 mL/min (ref >60)
Glucose, Bld: 104 mg/dL — ABNORMAL HIGH (ref 70–99)
Potassium: 4.5 mEq/L (ref 3.5–5.1)

## 2010-12-25 NOTE — H&P (Addendum)
  NAME:  [name] [name], [name] [name]                ACCOUNT NO.:  [id number]  MEDICAL RECORD NO.:  [id number]           PATIENT TYPE:  LOCATION:                                FACILITY:  [facility]  PHYSICIAN:  Dalia Heading, M.D.  DATE OF BIRTH:  1956-06-03  DATE OF ADMISSION: DATE OF DISCHARGE:  LH                             HISTORY & PHYSICAL   CHIEF COMPLAINT:  Cholecystitis, cholelithiasis, incisional hernia.  HISTORY OF PRESENT ILLNESS:  The patient is a 55 year old black female who is referred for evaluation and treatment of biliary colic secondary to cholelithiasis.  She also has an incisional hernia from a previous tubal ligation at the level of the umbilicus.  She has been having intermittent right upper quadrant abdominal pain with radiation to the flank, nausea, and indigestion for the past few weeks.  It is made worse with fatty foods.  She is also having umbilical pain from the incisional hernia.  No fever, chills, jaundice have been noted.  PAST MEDICAL HISTORY:  Morbid obesity, hypertension, non-insulin dependent diabetes mellitus, chronic anemia, Gad cholesterol levels.  PAST SURGICAL HISTORY:  Back surgery, foot surgery, tubal ligation.  CURRENT MEDICATIONS:  Aspirin 1 tablet p.o. daily which she is holding, Lasix 40 mg p.o. daily, nabumetone 750 mg p.o. b.i.d., amlodipine 10 mg p.o. daily, MiraLax 1 capful daily, spironolactone 25 mg p.o. daily, metformin 500 mg p.o. daily, Ambien 10 mg p.o. at bedtime p.r.n. insomnia, iron supplements 1 tablet p.o. daily, Crestor 10 mg p.o. at bedtime, losartan 1 tablet p.o. daily, omeprazole 20 mg p.o. b.i.d.  ALLERGIES:  IV DYE.  REVIEW OF SYSTEMS:  The patient denies drinking or smoking.  She denies any other cardiopulmonary difficulties or bleeding disorders.  FAMILY MEDICAL HISTORY:  Noncontributory.  PHYSICAL EXAMINATION:  GENERAL:  The patient is a obese black female in no acute distress. HEENT:  Reveals no scleral  icterus. LUNGS:  Clear to auscultation with equal breath sounds bilaterally. HEART:  Reveals regular rate and rhythm without S3, S4, or murmurs. ABDOMEN:  Soft and nondistended.  She is tender in the right upper quadrant to palpation.  No hepatosplenomegaly or masses are noted. Reducible incisional hernia is present at the umbilicus.  Ultrasound of the gallbladder reveals cholelithiasis with a normal common bile duct.  IMPRESSION: 1. Cholecystitis, cholelithiasis. 2. Incisional hernia.  PLAN:  The patient is scheduled for laparoscopic cholecystectomy and incisional herniorrhaphy with mesh on December 26, 2010.  Risks and benefits of the procedure including bleeding, infection, hepatobiliary injury, the possibly of an open procedure were fully explained to the patient, gave informed consent.     Dalia Heading, M.D.     MAJ/MEDQ  D:  12/22/2010  T:  12/23/2010  Job:  [id number]  cc:   Milus Mallick. Lodema Hong, M.D. Fax: [phone]  Electronically Signed by Franky Macho M.D. on 12/27/2010 08:49:47 AM

## 2010-12-26 ENCOUNTER — Other Ambulatory Visit: Payer: Self-pay | Admitting: General Surgery

## 2010-12-26 ENCOUNTER — Ambulatory Visit (HOSPITAL_COMMUNITY)
Admission: RE | Admit: 2010-12-26 | Discharge: 2010-12-26 | Disposition: A | Discharge status: Home / Self Care | Payer: Medicare HMO | Source: Ambulatory Visit | Attending: General Surgery | Admitting: General Surgery

## 2010-12-26 DIAGNOSIS — Z7982 Long term (current) use of aspirin: Secondary | ICD-10-CM | POA: Insufficient documentation

## 2010-12-26 DIAGNOSIS — I10 Essential (primary) hypertension: Secondary | ICD-10-CM | POA: Insufficient documentation

## 2010-12-26 DIAGNOSIS — E119 Type 2 diabetes mellitus without complications: Secondary | ICD-10-CM | POA: Insufficient documentation

## 2010-12-26 DIAGNOSIS — K432 Incisional hernia without obstruction or gangrene: Secondary | ICD-10-CM | POA: Insufficient documentation

## 2010-12-26 DIAGNOSIS — Z79899 Other long term (current) drug therapy: Secondary | ICD-10-CM | POA: Insufficient documentation

## 2010-12-26 DIAGNOSIS — K8010 Calculus of gallbladder with chronic cholecystitis without obstruction: Secondary | ICD-10-CM | POA: Insufficient documentation

## 2010-12-26 HISTORY — PX: CHOLECYSTECTOMY: SHX55

## 2010-12-26 HISTORY — PX: UMBILICAL HERNIA REPAIR: SHX196

## 2010-12-27 NOTE — Op Note (Signed)
NAME:  [name] [name], [name] [name]                ACCOUNT NO.:  [id number]  MEDICAL RECORD NO.:  [id number]           PATIENT TYPE:  O  LOCATION:  [facility]                          FACILITY:  [facility]  PHYSICIAN:  Dalia Heading, M.D.  DATE OF BIRTH:  [birth year]  DATE OF PROCEDURE:  12/26/2010 DATE OF DISCHARGE:                              OPERATIVE REPORT   PREOPERATIVE DIAGNOSES: 1. Cholecystitis. 2. Cholelithiasis. 3. Incisional hernia.  POSTOPERATIVE DIAGNOSES: 1. Cholecystitis. 2. Cholelithiasis. 3. Incisional hernia.  PROCEDURES: 1. Laparoscopic cholecystectomy. 2. Incisional herniorrhaphy with mesh.  SURGEON:  Dalia Heading, MD  ANESTHESIA:  General endotracheal.  INDICATIONS:  The patient is a 55 year old black female who presents with both cholecystitis secondary to cholelithiasis as well as an incisional hernia from previous tubal ligation at the level of the umbilicus.  The risks and benefits of both procedures including bleeding, infection, hepatobiliary injury, and the possibility of an open procedure were fully explained to the patient and gave informed consent.  PROCEDURE NOTE:  The patient was placed in the supine position.  After induction of general endotracheal anesthesia, the abdomen was prepped and draped using the usual sterile technique with DuraPrep.  Surgical site confirmation was performed.  An infraumbilical incision was made down to the fascia.  A Veress needle was introduced into the abdominal cavity and confirmation of placement was done using the saline drop test.  The abdomen was then insufflated to 16 mmHg pressure.  An 11-mm trocar was introduced into the abdominal cavity under direct visualization without difficulty.  The patient was placed in reversed Trendelenburg position.  An additional 11-mm trocar was placed in the epigastric region and 5-mm trocars were placed in the right upper quadrant and right flank regions.  Liver was inspected and noted  to be within normal limits.  Gallbladder was retracted superiorly and laterally.  The dissection was begun around the infundibulum of the gallbladder.  The cystic duct was first identified.  Its juncture to the infundibulum fully identified.  EndoClips were placed proximally and distally on the cystic duct and the cystic duct was divided.  This was likewise done in the cystic artery.  The gallbladder was then freed away from the gallbladder fossa using Bovie electrocautery.  The gallbladder was delivered through the trocar site using an EndoCatch bag.  The gallbladder fossa was inspected.  No abnormal bleeding was noted.  There was some spillage of bile during removal of the gallbladder.  Surgicel was placed in the gallbladder fossa.  All fluid and air were then evacuated from the abdominal cavity prior to removal of the trocars.  Next, the infraumbilical incision was then extended.  The umbilicus was freed away from the underlying hernia sac.  The patient was noted to have 2 defects in close proximity at the umbilical level.  The were made into one.  Next, a Ventralex medium-size patch was then inserted into the abdominal cavity after the omental adhesions were reduced and secured to the fascia using 0 Ethibond interrupted sutures.  The base of the umbilicus was secured back to the fascia using a 2-0 Vicryl figure- of-eight suture.  All skin incisions were then closed with staples.  A 0.5% Sensorcaine was instilled in to the surrounding wounds.  Betadine ointment and dry sterile dressings were applied.  All tape and needle counts were correct at the end of the procedure. The patient was extubated in the operating room and went back to the recovery room in awake and stable condition.  COMPLICATIONS:  None.  SPECIMEN:  Gallbladder.  ESTIMATED BLOOD LOSS:  Minimal.     Dalia Heading, M.D.     MAJ/MEDQ  D:  12/26/2010  T:  12/27/2010  Job:  [id number]  cc:   Milus Mallick.  Lodema Hong, M.D. Fax: [phone]  Electronically Signed by Franky Macho M.D. on 12/27/2010 08:49:49 AM

## 2010-12-28 ENCOUNTER — Ambulatory Visit (INDEPENDENT_AMBULATORY_CARE_PROVIDER_SITE_OTHER): Payer: Medicare HMO | Admitting: Internal Medicine

## 2011-01-23 LAB — BASIC METABOLIC PANEL
CO2: 31 mEq/L (ref 19–32)
Chloride: 99 mEq/L (ref 96–112)
Creatinine, Ser: 0.76 mg/dL (ref 0.4–1.2)
GFR calc Af Amer: >60 mL/min (ref >60)
Glucose, Bld: 104 mg/dL — ABNORMAL HIGH (ref 70–99)

## 2011-01-23 LAB — CBC
MCHC: 33.3 g/dL (ref 30.0–36.0)
MCV: 81.2 fL (ref 78.0–100.0)
RBC: 4.34 MIL/uL (ref 3.87–5.11)

## 2011-01-23 LAB — DIFFERENTIAL
Basophils Absolute: 0.1 10*3/uL (ref 0.0–0.1)
Eosinophils Relative: 3 % (ref 0–5)
Lymphs Abs: 1.7 10*3/uL (ref 0.7–4.0)
Monocytes Relative: 10 % (ref 3–12)
Neutro Abs: 3.3 10*3/uL (ref 1.7–7.7)

## 2011-02-07 ENCOUNTER — Encounter: Payer: Self-pay | Admitting: Family Medicine

## 2011-02-09 ENCOUNTER — Encounter: Payer: Self-pay | Admitting: Family Medicine

## 2011-02-10 ENCOUNTER — Other Ambulatory Visit: Payer: Self-pay | Admitting: Family Medicine

## 2011-02-10 LAB — HEMOGLOBIN A1C: Hgb A1c MFr Bld: 6.4 % — ABNORMAL HIGH (ref <5.7)

## 2011-02-13 ENCOUNTER — Encounter: Payer: Self-pay | Admitting: Family Medicine

## 2011-02-13 ENCOUNTER — Ambulatory Visit (INDEPENDENT_AMBULATORY_CARE_PROVIDER_SITE_OTHER): Payer: Medicare HMO | Admitting: Family Medicine

## 2011-02-13 VITALS — BP 150/80 | HR 61 | Resp 16 | Wt 235.8 lb

## 2011-02-13 DIAGNOSIS — E119 Type 2 diabetes mellitus without complications: Secondary | ICD-10-CM

## 2011-02-13 DIAGNOSIS — Z1239 Encounter for other screening for malignant neoplasm of breast: Secondary | ICD-10-CM

## 2011-02-13 DIAGNOSIS — E785 Hyperlipidemia, unspecified: Secondary | ICD-10-CM

## 2011-02-13 DIAGNOSIS — I10 Essential (primary) hypertension: Secondary | ICD-10-CM

## 2011-02-13 MED ORDER — METFORMIN HCL 500 MG PO TABS: 500.0000 mg | ORAL_TABLET | Freq: Three times a day (TID) | ORAL | Status: DC

## 2011-02-13 MED ORDER — LOSARTAN POTASSIUM 50 MG PO TABS: 50.0000 mg | ORAL_TABLET | Freq: Every day | ORAL | Status: DC

## 2011-02-13 NOTE — Progress Notes (Signed)
  Subjective:    Patient ID: [name] [name], female    DOB: 21-Apr-1956, 55 y.o.   MRN: [id number]  HPI HYPERTENSION Disease Monitoring Blood pressure range-nl Chest pain- no      Dyspnea- no Medications Compliance- good Lightheadedness- no   Edema- no   DIABETES Disease Monitoring Blood Sugar ranges-fasting avg 110 Polyuria- no New Visual problems- no Medications Compliance- good Hypoglycemic symptoms- no Eye exam up to date, due in the Summer, no diabetic complications per pt rept drs vision Podiatry yes but not regulalrly  HYPERLIPIDEMIA Disease Monitoring See symptoms for Hypertension Medications Compliance- good RUQ pain- nsore from recent surgery , cholecystectomy Muscle aches- no      Review of Systems Denies recent fever or chills. Denies sinus pressure, nasal congestion, ear pain or sore throat. Denies chest congestion, productive cough or wheezing. Denies chest pains, palpitations, paroxysmal nocturnal dyspnea, orthopnea and leg swelling Denies abdominal pain, nausea, vomiting,diarrhea or constipation.  Denies rectal bleeding or change in bowel movement. Denies dysuria, frequency, hesitancy or incontinence. Denies joint pain, swelling and limitation in mobility. Denies headaches, seizure, numbness, or tingling. Denies depression, anxiety or insomnia. Denies skin break down or rash.        Objective:   Physical Exam Patient alert and oriented and in no Cardiopulmonary distress.  HEENT: No facial asymmetry, EOMI, no sinus tenderness, TM's clear, Oropharynx pink and moist.  Neck supple no adenopathy.  Chest: Clear to auscultation bilaterally.  CVS: S1, S2 no murmurs, no S3.  ABD: Soft non tender. Bowel sounds normal.  Ext: No edema  MS: Adequate ROM spine, shoulders, hips and knees.  Skin: Intact, no ulcerations or rash noted.  Psych: Good eye contact, normal affect. Memory intact not anxious or depressed appearing.  CNS: CN 2-12 intact, power,  tone and sensation normal throughout.        Assessment & Plan:

## 2011-02-13 NOTE — Assessment & Plan Note (Signed)
Deteriorated, will increase the dose of metformin to 500mg  one 3 times daily

## 2011-02-13 NOTE — Patient Instructions (Signed)
F/u in 6 weeks and 3months  Your blood pressure is Hattery , start taking cozaar 50mg  TWO every day pls  The dose of your metformin is increased  By 500mg  , new script has been sent in.  It is important that you exercise regularly at least 30 minutes 5 times a week. If you develop chest pain, have severe difficulty breathing, or feel very tired, stop exercising immediately and seek medical attention  A healthy diet is rich in fruit, vegetables and whole grains. Poultry fish, nuts and beans are a healthy choice for protein rather then red meat. A low sodium diet and drinking 64 ounces of water daily is generally recommended. Oils and sweet should be limited. Carbohydrates especially for those who are diabetic or overweight, should be limited to 34-45 gram per meal. It is important to eat on a regular schedule, at least 3 times daily. Snacks should be primarily fruits, vegetables or nuts.   Fasting labs are due in THREE months  I am glad that your surgery went well

## 2011-02-13 NOTE — Assessment & Plan Note (Signed)
Medication compliance addressed. Commitment to regular exercise and healthy  food choices, with portion control discussed. DASH diet and low fat diet discussed and literature offered. Changes in medication made at this visit.  

## 2011-02-13 NOTE — Assessment & Plan Note (Signed)
Low fat diet discussed, labs in 3 months

## 2011-03-03 NOTE — Op Note (Signed)
NAME:  [name], [name]                ACCOUNT NO.:  [id number]   MEDICAL RECORD NO.:  [id number]          PATIENT TYPE:  AMB   LOCATION:                                FACILITY:  [facility]   PHYSICIAN:  Lionel December, M.D.    DATE OF BIRTH:  August 21, 1956   DATE OF PROCEDURE:  01/31/2006  DATE OF DISCHARGE:                                 OPERATIVE REPORT   PROCEDURE:  Esophagogastroduodenoscopy with esophageal dilation followed by  colonoscopy.   INDICATIONS:  [name] [name] is a 55 year old African-American female who has  intermittent solid food dysphagia for several months duration, who also  complains of daily heartburn.  She is undergoing diagnostic and therapeutic  EGD.  She also has constipation and intermittent hematochezia.  She is  undergoing colonoscopy both for diagnostic and screening purposes.  Procedure and risks were reviewed with the patient, and informed consent was  obtained.   MEDS FOR CONSCIOUS SEDATION:  Benzocaine spray for oropharyngeal topical  anesthesia.  Demerol 50 mg IV, Versed 10 mg IV.   FINDINGS:  Procedure performed in endoscopy suite.  The patient's vital  signs and O2 saturations were monitored during the procedure and remained  stable.   PROCEDURE #1: ESOPHAGOGASTRODUODENOSCOPY:  The patient was placed in the  left lateral position; and an Olympus videoscope was passed via oropharynx  without any difficulty into esophagus.   ESOPHAGUS:  Mucosa of the esophagus normal.  There was noncritical ring at  GE junction which is at 35 cm and a hiatus at 37.   STOMACH:  It was empty and distended very well with insufflation.  Folds of  proximal stomach were normal.  Examination of the mucosa at body, antrum,  pyloric channel as well as angularis, fundus, and cardia was normal.   DUODENUM:  Mucosa of the bulb revealed 2 erosions.  Scope was passed into  the second part of duodenum where the mucosa and folds were normal.   Endoscope was withdrawn.   Esophagus was  dilated by passing 56-French Maloney dilator to full  insertion.  As the dilator was withdrawn, the endoscope was passed; again,  and there was about a 2-cm boat-shaped tear to cervical esophagus indicative  of a web.  There was no mucosal tear distally.  Endoscope was withdrawn.  The patient prepared for procedure #2.   COLONOSCOPY:  Rectal examination performed.  No abnormality noted on  external or digital exam.  Olympus videoscope was placed in the rectum and  advanced under vision into sigmoid colon and beyond.  Preparation was  satisfactory.  There was a single diverticulum at the ascending colon with a  few tiny ones.  Cecum was identified by appendiceal orifice and ileocecal  valve.  Pictures were taken for the record.  As the scope was withdrawn,  colonic mucosa was examined for the second time; and there were no masses or  polyps noted.  Rectal mucosa similarly was normal.  Scope was retroflexed to  examine the anorectal junction and small hemorrhoids were noted below the  dentate line.  Endoscope was straightened and withdrawn.  The  patient  tolerated the procedure well.   FINAL DIAGNOSIS:  1.  Esophageal web which was disrupted by passing 56-French Maloney dilator.  2.  Noncritical Schatzki's ring which was intact following esophageal      dilation.  3.  Small sliding hiatal hernia.  4.  Erosive bulbar duodenitis.  5.  A few diverticula at the ascending colon and external hemorrhoids,      otherwise normal colonoscopy.   RECOMMENDATIONS:  1.  H. pylori serology will be checked.  2.  Antireflux measures.  3.  Omeprazole 20 mg p.o. q.a.m.  4.  Creasey-fiber diet plus fiber supplement 3-4 grams daily.  5.  Colace 2 tablets at bedtime.  6.  I will be contacting patient with results of blood test, and further      recommendations if any.      Lionel December, M.D.  Electronically Signed     NR/MEDQ  D:  01/31/2006  T:  01/31/2006  Job:  [id number]   cc:   Annia Friendly. Loleta Chance,  MD  Fax: [phone]

## 2011-03-03 NOTE — H&P (Signed)
NAME:  [name] [name], [name] [name]                ACCOUNT NO.:  [id number]   MEDICAL RECORD NO.:  [id number]          PATIENT TYPE:  EMS   LOCATION:  ED                            FACILITY:  [facility]   PHYSICIAN:  R. Roetta Sessions, M.D. DATE OF BIRTH:  1955-10-23   DATE OF ADMISSION:  01/04/2006  DATE OF DISCHARGE:  03/22/2007LH                                HISTORY & PHYSICAL   CHIEF COMPLAINT:  Constipation, wants colonoscopy.   HISTORY OF PRESENT ILLNESS:  The patient is a 55 year old African-American  female who presents with a self-referral today for a possible colonoscopy  and chronic constipation.  Her primary care physician is Annia Friendly. Loleta Chance,  M.D.  She has chronic constipation for about 25 years.  She may have two to  three bowel movements a week.  Stools are usually hard balls.  She has to  strain.  Occasionally sees bright red blood per rectum on the toilet tissue.  Recently has started developing some epigastric cramping.  This usually  occurs in the morning.  She complains of several months' history of solid  food dysphagia.  She noticed this after she swallowed a crown that fell off  of her tooth.  She takes OTC laxatives such as Correctol and Ex-Lax.  She  has been on Citrucel before temporarily without any significant results.  She consumes a Veneziano-fiber diet.  She never had a colonoscopy.   CURRENT MEDICATIONS:  1.  Xanax 0.25 mg daily p.r.n.  2.  Tarka 2/240 mg daily.  3.  Benicar 20 mg daily.  4.  Lotrel 10/20 mg daily.  5.  Aspirin 81 mg daily.  6.  Fluoxetine 20 mg daily.   ALLERGIES:  No known drug allergies.   PAST MEDICAL HISTORY:  1.  Hypertension.  2.  Depression and anxiety.  3.  Hypercholesterolemia.   PAST SURGICAL HISTORY:  1.  Back surgery.  2.  Cyst removed from her left foot.  3.  Tubal ligation.   FAMILY HISTORY:  Positive for diabetes.  No family history of colorectal  cancer.   SOCIAL HISTORY:  She is divorced.  She has two children, ages 31 and 18.  She is disabled.  Never been a smoker, no alcohol use.   REVIEW OF SYSTEMS:  GASTROINTESTINAL:  See HPI.  CONSTITUTIONAL:  No  unexplained weight loss.  CARDIOPULMONARY:  No chest pain or shortness of  breath.   PHYSICAL EXAMINATION:  VITAL SIGNS:  Weight 240, height 5 feet 5 inches.  Temperature 97.9, blood pressure 183/82, pulse 64.  GENERAL:  A pleasant, morbidly obese black female in no acute distress.  SKIN:  Warm and dry, no jaundice.  HEENT:  Conjunctivae are pink, sclerae are nonicteric.  Oropharyngeal mucosa  moist and pink.  No lesions, erythema or exudate.  NECK:  No lymphadenopathy or thyromegaly.  CHEST:  Lungs are clear to auscultation.  CARDIAC:  Regular rate and rhythm, normal S1, S2, no murmurs, rubs or  gallops.  ABDOMEN:  Positive bowel sounds, obese but symmetrical.  Soft.  Nontender.  No organomegaly or masses.  No rebound  tenderness or guarding.  No abdominal  bruits or hernias.  EXTREMITIES:  No edema.   IMPRESSION:  The patient is a 55 year old lady with chronic constipation,  intermittent toilet tissue hematochezia, who has never had a colonoscopy.  Constipation most likely falls in the realm of slow colonic transit and/or  functional.  She may be having hematochezia secondary to a benign anorectal  source.  She needs to be screened for colorectal cancer, however.  In  addition, she complains of several months' history of solid food dysphagia.  Recently developed epigastric pain of unclear etiology.  She does not  clearly have any typical gastroesophageal reflux disease symptoms.   PLAN:  1.  Colonoscopy and EGD with possible esophageal dilatation in the near      future.  2.  Trial of Miralax 17 g daily, #527 g, five refills given.  3.  Fiber Sure one packet daily, two-week supply given.  4.  Further recommendations to follow.      Tana Coast, P.AJonathon Bellows, M.D.  Electronically Signed    LL/MEDQ  D:  01/18/2006  T:  01/18/2006   Job:  [id number]   cc:   Annia Friendly. Loleta Chance, MD  Fax: [phone]

## 2011-03-03 NOTE — H&P (Signed)
[place]. [facility]  Patient:    [name] [name], [name] [name]                         MRN: [id number] Adm. Date:  [id number] Disc. Date: [id number] Attending:  Molpus, John L                         History and Physical  HISTORY OF PRESENT ILLNESS:  Mrs. Briddell presented to my office on December 06, 1999 for evaluation of back pain and right lower extremity numbness.  She is a 55 year old woman who has had this problem since 1996 when she slipped in oil while at work.  She has not worked since 1999.  She has had numbness in her right lower extremity and it feels like a tightness and cold at times.  She also feels weakness in the right lower extremity.  She has pain in the leg and does have some mild pain in the back. She does not have problems on the left side.  She recently underwent foot surgery on the left and came in with a cast.  She has been out on sick leave since 1999.  She has a history of hypertension.  Mrs. Goggins also has a weight problem and is obese.  We tried epidural steroid injections which did not help her significantly.  She did undergo an EMG which was normal and it did not show evidence of an old S1 radiculopathy.  Mrs. Kissel returns for follow-up on a number of occasions, more recently on July 24, 2000.  She still had problems in her right leg.  I told her that we could proceed with a far lateral foraminotomy at L4/5 on the right side to correct the only real defect that was present on the CT scan.  Since she had isolated pain on the right side, I felt that this might provide some benefit. However, long history of leg pain on the right side and her body habitus, I think, precludes the probably best results unless she is willing to work as hard afterwards to keep herself in shape and to loose weight.  She has agreed to this and understands what my reservations were and what my reservations are.  FAMILY HISTORY:  Mother age 26 is in good health.  Father  age 43 is in good health.  ALLERGIES:  ASPIRIN gives her hives.  SOCIAL HISTORY:  She does not smoke, drink, or use illicit drugs.  She is 5 feet 5 inches tall and weighing 240 pounds.  MEDICATIONS:  She takes Trazodone, Prozac, Neurontin, K-Dur, verapamil, and hydrochlorothiazide.  REVIEW OF SYSTEMS:  Positive for night sweats, eye glasses, ear injuries, ear infections, hypertension, swelling in her right lower extremity, leg pain with walking, leg weakness, back pain, arthritis, and neck pain.  PHYSICAL EXAMINATION:  GENERAL:  On exam, she is morbidly obese.  NEUROLOGICAL:  She moves very slowly and has an antalgic gait.  She is steady on her feet.  She has give-away weakness in all muscle groups of the lower extremity and on manual testing exhibited no greater than 4/5 strength in the dorsiflexion and plantar flexors.  She had poor movement of her dorsiflexors and plantar flexors on her right side.  Downgoing toes to plantar stimulation. Normal muscle tone.  Phonation was normal.  Deep tendon reflexes intact at the knees and ankle.  No clonus.  Pinprick intact.  Negative straight leg raising.  LABORATORY DATA:  MRI showed foraminal stenosis at L4/5 on the right and left side, more pronounced than the right side.  Left side present in her neural foramen on her right compared to the left.  Other evidence showed no disc problems.  A bulge at 3/4 not causing neural compression.  Mild spondylosis in the lumbar spine.  Clonus is normal.  Plain films showed normal alignment in AP and lateral views.  No evidence of pars defect or spondylolisthesis.  Decrease disc at L5 and S1.  RECOMMENDATIONS:  I have recommended and she has agreed to undergo far lateral lumbar vertebrae-4/5 foraminotomy.  Risks of this fusion including bleeding, infection, no pain relief, bowel or bladder dysfunction, need for further operation, worsening of pain, nerve root damage, and spinal instability  were discussed.  She understands and wishes to proceed. DD:  07/30/00 TD:  07/30/00 Job: [id number]

## 2011-03-03 NOTE — Procedures (Signed)
NAME:  [name] [name], [name] [name]                ACCOUNT NO.:  [id number]   MEDICAL RECORD NO.:  [id number]          PATIENT TYPE:  OUT   LOCATION:  RESP                          FACILITY:  [facility]   PHYSICIAN:  Kofi A. Gerilyn Pilgrim, M.D. DATE OF BIRTH:  04-Sep-1956   DATE OF PROCEDURE:  DATE OF DISCHARGE:                              EEG INTERPRETATION   The patient is a 55 year old lady who has recurrent spells of syncope  associated with muscle jerk activity.   REFERRING PHYSICIAN:  Erle Crocker.   MEDICATIONS:  Aspirin, furosemide, amlodipine, Benicar, Prozac,  omeprazole.   ANALYSIS:  This is a 16-channel recording that is conduced using  standard 10/20 protocol.  There is a posterior background activity of 10  hertz.  There is frontal beta activity noted.  Awake and drowsy  activities are recorded.  Photic stimulation is conducted and shows no  significant change in the background activity.  There is no focal  slowing, lateralized slowing, or epileptiform activity noted.   IMPRESSION:  This is a normal recording in the awake and drowsy states.   A single recording does  not rule out epileptic seizures.  If clinically  indicated, a sleep deprived recording could be useful.      Kofi A. Gerilyn Pilgrim, M.D.  Electronically Signed     KAD/MEDQ  D:  08/22/2006  T:  08/22/2006  Job:  [id number]

## 2011-03-03 NOTE — Procedures (Signed)
NAME:  [name] [name], [name] [name]                ACCOUNT NO.:  [id number]   MEDICAL RECORD NO.:  [id number]          PATIENT TYPE:  OUT   LOCATION:  RAD                           FACILITY:  [facility]   PHYSICIAN:  Gerrit Friends. Dietrich Pates, MD, FACCDATE OF BIRTH:  [birth year]   DATE OF PROCEDURE:  09/19/2006  DATE OF DISCHARGE:  09/19/2006                                ECHOCARDIOGRAM   REFERRING PHYSICIAN:  Erle Crocker, M.D.   CLINICAL DATA:  A 55 year old woman with syncope.   M-MODE:  Aorta 2.9, left atrium 3.7, septum 1.4, posterior wall 1.1, LV  diastole 4.6, LV systole 3.5.   1. Technically adequate echocardiographic study.  2. Left atrial size at the upper limit of normal; normal right atrium      and right ventricle.  3. Trileaflet and normal aortic valve.  Trivial aortic insufficiency.  4. Normal internal diameter of the proximal ascending aorta; very mild      calcification of the aortic annulus.  5. Normal mitral valve structure with borderline prolapse and minimal      regurgitation.  Mild to moderate annular calcification.  6. Normal tricuspid valve; trivial regurgitation.  Normal estimated RV      systolic pressure.  7. Normal pulmonic valve; normal proximal pulmonary artery.  8. Normal internal dimension of the left ventricle; normal wall      thickness except for mild hypertrophy of the proximal septum.      Normal regional and global function.      Gerrit Friends. Dietrich Pates, MD, [facility]  Electronically Signed     RMR/MEDQ  D:  09/23/2006  T:  09/23/2006  Job:  [id number]

## 2011-03-03 NOTE — Op Note (Signed)
[place]. [facility]  Patient:    [name] [name], [name] [name]                         MRN: [id number] Proc. Date: 07/30/00 Adm. Date:  [id number] Disc. Date: [id number] Attending:  Molpus, John L                           Operative Report  PREOPERATIVE DIAGNOSIS:  Foraminal stenosis at L4-5 bilateral.  POSTOPERATIVE DIAGNOSIS:  Foraminal stenosis at L4-5 bilateral.  PROCEDURE:  Right L4-5 bilateral foraminotomy, with microdissection.  COMPLICATIONS:  None.  SURGEON:  Kyle L. Franky Macho, M.D.  ASSISTANT:  Tanya Nones. Jeral Fruit, M.D.  INDICATIONS: [name] [name] is a woman with foraminal stenosis and right lower extremity pain since 1996.  Conservative measures have not helped, and I have recommended and she has agreed to undergo bilateral diskectomy to try and relieve some of the pain in the right lower extremity.  OPERATIVE NOTATION:  Went to operating room, intubated and placed under general anesthesia without difficulty.  The patient was rolled prone onto a Wilson frame, ensuring all pressure points were properly padded and that there was no pressure on belly.  The back was prepped and I placed a spinal needle for localization.  This was performed, but she was contaminated at that time. She was prepped again.  She was draped in sterile fashion.  I then infiltrated 10 cc of 0.5% lidocaine 1:200,000 epinephrine in supine position.  I made incision and extended with a #10 blade; took this down through the subcutaneous fat sharply.  I used monopolar cautery to go further and expose the thoracolumbar fascia.  I exposed the lamina of L4, L5 and L3.  I took an x-ray which showed I was at the L4 lamina.  I then used ______ dye and went to the ______ articularis was L4 on the right side.  Drilled small foraminotomy there.  I was then able to remove the soft tissue along with Dr. Eino Farber assistance; using the microscope at this point and time.   We then removed ligament and tension; L4  was positively identified.  I took another x-ray to make sure I was at the correct position.  It was felt that there was no disk bulge or disk material there.  At the end of the decompression there was no pressure on the L4 nerve root whatsoever.  There was no foreign material that we could feel extra-foraminally or intraforaminally with our probe.  I irrigated the wound and closed one in the layer of fascia using Vicryl sutures.  The fat was reapproximated, as was the subcutaneous tissue with interrupted Vicryl.  The skin was reapproximated with Steri-Strips. DD:  07/30/00 TD:  07/30/00 Job: [id number]

## 2011-03-20 ENCOUNTER — Telehealth: Payer: Self-pay | Admitting: Family Medicine

## 2011-03-20 NOTE — Telephone Encounter (Signed)
She states she has weird feeling she states at night it seems to get worse feeling.  On her right side she states it is like a numbness feeling from head down.  She never seen the bug she noticed the bite last Sunday, she thought that the feeling would go away.  She did notice another bite on her lip on Monday morning.  She says that she isn't sure if it was a sweat bee, or a mosquito, she just hasn't feel good since this.  She wanted to come in this week, I didn't see anywhere to put her please advise patient.

## 2011-03-21 ENCOUNTER — Emergency Department (HOSPITAL_COMMUNITY): Payer: Medicare HMO

## 2011-03-21 ENCOUNTER — Emergency Department (HOSPITAL_COMMUNITY)
Admission: EM | Admit: 2011-03-21 | Discharge: 2011-03-21 | Disposition: A | Discharge status: Home / Self Care | Payer: Medicare HMO | Attending: Emergency Medicine | Admitting: Emergency Medicine

## 2011-03-21 DIAGNOSIS — Z79899 Other long term (current) drug therapy: Secondary | ICD-10-CM | POA: Insufficient documentation

## 2011-03-21 DIAGNOSIS — E119 Type 2 diabetes mellitus without complications: Secondary | ICD-10-CM | POA: Insufficient documentation

## 2011-03-21 DIAGNOSIS — R209 Unspecified disturbances of skin sensation: Secondary | ICD-10-CM | POA: Insufficient documentation

## 2011-03-21 DIAGNOSIS — I10 Essential (primary) hypertension: Secondary | ICD-10-CM | POA: Insufficient documentation

## 2011-03-21 LAB — URINALYSIS, ROUTINE W REFLEX MICROSCOPIC
Nitrite: NEGATIVE
Specific Gravity, Urine: 1.025 (ref 1.005–1.030)
Urobilinogen, UA: 0.2 mg/dL (ref 0.0–1.0)

## 2011-03-21 LAB — CBC
HCT: 34.9 % — ABNORMAL LOW (ref 36.0–46.0)
MCHC: 31.5 g/dL (ref 30.0–36.0)
MCV: 85.5 fL (ref 78.0–100.0)
Platelets: 245 10*3/uL (ref 150–400)
RDW: 14.5 % (ref 11.5–15.5)

## 2011-03-21 LAB — COMPREHENSIVE METABOLIC PANEL
ALT: 10 U/L (ref 0–35)
AST: 16 U/L (ref 0–37)
Albumin: 3.9 g/dL (ref 3.5–5.2)
Calcium: 10.1 mg/dL (ref 8.4–10.5)
GFR calc Af Amer: >60 mL/min (ref >60)
Glucose, Bld: 85 mg/dL (ref 70–99)
Sodium: 137 mEq/L (ref 135–145)
Total Protein: 8 g/dL (ref 6.0–8.3)

## 2011-03-21 LAB — CK TOTAL AND CKMB (NOT AT ARMC): Relative Index: 1 (ref 0.0–2.5)

## 2011-03-21 LAB — DIFFERENTIAL
Basophils Absolute: 0 10*3/uL (ref 0.0–0.1)
Eosinophils Absolute: 0.2 10*3/uL (ref 0.0–0.7)
Eosinophils Relative: 3 % (ref 0–5)
Lymphocytes Relative: 28 % (ref 12–46)
Lymphs Abs: 1.6 10*3/uL (ref 0.7–4.0)
Monocytes Absolute: 0.4 10*3/uL (ref 0.1–1.0)

## 2011-03-21 LAB — TROPONIN I: Troponin I: <0.3 ng/mL (ref <0.30)

## 2011-03-21 NOTE — Telephone Encounter (Signed)
Patient aware.

## 2011-03-21 NOTE — Telephone Encounter (Signed)
I advise urgent care or ED  eval since she has concerns about being sick after unknown insect bites

## 2011-03-21 NOTE — Telephone Encounter (Signed)
Called patient, left message.

## 2011-03-22 ENCOUNTER — Telehealth: Payer: Self-pay | Admitting: Family Medicine

## 2011-03-22 DIAGNOSIS — I10 Essential (primary) hypertension: Secondary | ICD-10-CM

## 2011-03-22 NOTE — Telephone Encounter (Signed)
Called patient, busy signal x 2 

## 2011-03-23 NOTE — Telephone Encounter (Signed)
Patient had question regarding her blood pressure medicine. Wanted to make sure she was supposed to take two, advised yes

## 2011-03-24 ENCOUNTER — Encounter: Payer: Self-pay | Admitting: Family Medicine

## 2011-03-30 ENCOUNTER — Ambulatory Visit (INDEPENDENT_AMBULATORY_CARE_PROVIDER_SITE_OTHER): Payer: Medicare HMO | Admitting: Family Medicine

## 2011-03-30 ENCOUNTER — Encounter: Payer: Self-pay | Admitting: Family Medicine

## 2011-03-30 VITALS — BP 148/90 | HR 72 | Resp 16 | Ht 64.0 in | Wt 234.1 lb

## 2011-03-30 DIAGNOSIS — E119 Type 2 diabetes mellitus without complications: Secondary | ICD-10-CM

## 2011-03-30 DIAGNOSIS — E785 Hyperlipidemia, unspecified: Secondary | ICD-10-CM

## 2011-03-30 DIAGNOSIS — F329 Major depressive disorder, single episode, unspecified: Secondary | ICD-10-CM

## 2011-03-30 DIAGNOSIS — F411 Generalized anxiety disorder: Secondary | ICD-10-CM

## 2011-03-30 DIAGNOSIS — I10 Essential (primary) hypertension: Secondary | ICD-10-CM

## 2011-03-30 DIAGNOSIS — T1490XA Injury, unspecified, initial encounter: Secondary | ICD-10-CM

## 2011-03-30 DIAGNOSIS — F3289 Other specified depressive episodes: Secondary | ICD-10-CM

## 2011-03-30 DIAGNOSIS — T148XXA Other injury of unspecified body region, initial encounter: Secondary | ICD-10-CM

## 2011-03-30 MED ORDER — DOXYCYCLINE HYCLATE 100 MG PO TABS: 100.0000 mg | ORAL_TABLET | Freq: Two times a day (BID) | ORAL | Status: AC

## 2011-03-30 MED ORDER — BUSPIRONE HCL 10 MG PO TABS: 10.0000 mg | ORAL_TABLET | Freq: Two times a day (BID) | ORAL | Status: DC

## 2011-03-30 MED ORDER — DOXYCYCLINE HYCLATE 100 MG PO TABS: 100.0000 mg | ORAL_TABLET | Freq: Two times a day (BID) | ORAL | Status: DC

## 2011-03-30 NOTE — Patient Instructions (Signed)
F/u end July.  HBA!c , fasting lipid, cmp and eGFR end July.  New med for  Anxiety  Med also sent in for bite

## 2011-03-30 NOTE — Progress Notes (Signed)
  Subjective:    Patient ID: [name] [name], female    DOB: Feb 11, 1956, 55 y.o.   MRN: [id number]  HPI Reports on may 27 was bit on the back of the neck,  Then after this she saw swelling and a blister  On the right lower lip, ever since then  She reports numbness on the right side of face and upper and lower extremities.  Still reports abdomnal symptoms and tenderness, epigastric and rlq espesciall, ynormal bM, no vomitting does have  nausea . C/o increased anxiety, and constantly worrying over her health. Reports blood sugars are seldom over 120 when checked      Review of Systems Denies recent fever or chills. Denies sinus pressure, nasal congestion, ear pain or sore throat. Denies chest congestion, productive cough or wheezing. Denies chest pains, palpitations, paroxysmal nocturnal dyspnea, orthopnea and leg swelling  Denies dysuria, frequency, hesitancy or incontinence. Denies joint pain, swelling and limitation in mobility. Denies headaches, seizure, numbness, or tingling.        Objective:   Physical Exam Patient alert and oriented and in no Cardiopulmonary distress.Anxious, flight of ideas  HEENT: No facial asymmetry, EOMI, no sinus tenderness, TM's clear, Oropharynx pink and moist.  Neck supple no adenopathy.  Chest: Clear to auscultation bilaterally.  CVS: S1, S2 no murmurs, no S3.  ABD: Soft non tender. Bowel sounds normal.  Ext: No edema  MS: Adequate ROM spine, shoulders, hips and knees.  Skin: Intact, erythematous macular rash on right shoulder with puncture site Psych: Good eye contact, normal affect.  anxious  CNS: CN 2-12 intact, power, tone and sensation normal throughout.        Assessment & Plan:

## 2011-04-09 NOTE — Assessment & Plan Note (Signed)
Uncontrolled with excessive anxiety, new med started

## 2011-04-09 NOTE — Assessment & Plan Note (Signed)
Uncontrolled and deteriorated, new med to be started

## 2011-04-09 NOTE — Assessment & Plan Note (Signed)
Pt believes she was bitten by spider or tick, she does have erythematous area on right shoulder will prescribe antibiotics prophylactically

## 2011-04-09 NOTE — Assessment & Plan Note (Signed)
Uncontrolled , importance of medication compliance stressed

## 2011-04-09 NOTE — Assessment & Plan Note (Signed)
Controlled, no change in medication  

## 2011-04-17 ENCOUNTER — Other Ambulatory Visit: Payer: Self-pay | Admitting: *Deleted

## 2011-04-17 ENCOUNTER — Other Ambulatory Visit: Payer: Self-pay | Admitting: Family Medicine

## 2011-04-17 DIAGNOSIS — E119 Type 2 diabetes mellitus without complications: Secondary | ICD-10-CM

## 2011-04-17 DIAGNOSIS — I10 Essential (primary) hypertension: Secondary | ICD-10-CM

## 2011-04-17 MED ORDER — ROSUVASTATIN CALCIUM 10 MG PO TABS: 10.0000 mg | ORAL_TABLET | Freq: Every day | ORAL | Status: DC

## 2011-04-17 MED ORDER — ZOLPIDEM TARTRATE 10 MG PO TABS: 10.0000 mg | ORAL_TABLET | Freq: Every evening | ORAL | Status: DC | PRN

## 2011-04-17 MED ORDER — FUROSEMIDE 40 MG PO TABS: 40.0000 mg | ORAL_TABLET | Freq: Every day | ORAL | Status: DC

## 2011-04-17 MED ORDER — LOSARTAN POTASSIUM 50 MG PO TABS: 50.0000 mg | ORAL_TABLET | Freq: Every day | ORAL | Status: DC

## 2011-04-17 MED ORDER — METFORMIN HCL 500 MG PO TABS: 500.0000 mg | ORAL_TABLET | Freq: Three times a day (TID) | ORAL | Status: DC

## 2011-04-17 MED ORDER — AMLODIPINE BESYLATE 10 MG PO TABS: 10.0000 mg | ORAL_TABLET | Freq: Every day | ORAL | Status: DC

## 2011-04-17 NOTE — Progress Notes (Signed)
meds sent as requested 

## 2011-04-25 ENCOUNTER — Other Ambulatory Visit: Payer: Self-pay

## 2011-04-25 DIAGNOSIS — I10 Essential (primary) hypertension: Secondary | ICD-10-CM

## 2011-04-25 MED ORDER — LOSARTAN POTASSIUM 50 MG PO TABS: ORAL_TABLET | ORAL | Status: DC

## 2011-05-09 ENCOUNTER — Encounter: Payer: Self-pay | Admitting: Family Medicine

## 2011-05-12 ENCOUNTER — Encounter: Payer: Self-pay | Admitting: Family Medicine

## 2011-05-15 ENCOUNTER — Encounter: Payer: Self-pay | Admitting: Family Medicine

## 2011-05-15 ENCOUNTER — Ambulatory Visit (INDEPENDENT_AMBULATORY_CARE_PROVIDER_SITE_OTHER): Payer: Medicare HMO | Admitting: Family Medicine

## 2011-05-15 VITALS — BP 120/80 | HR 78 | Resp 16 | Ht 65.0 in | Wt 234.4 lb

## 2011-05-15 DIAGNOSIS — R002 Palpitations: Secondary | ICD-10-CM

## 2011-05-15 DIAGNOSIS — E785 Hyperlipidemia, unspecified: Secondary | ICD-10-CM

## 2011-05-15 DIAGNOSIS — I10 Essential (primary) hypertension: Secondary | ICD-10-CM

## 2011-05-15 DIAGNOSIS — E119 Type 2 diabetes mellitus without complications: Secondary | ICD-10-CM

## 2011-05-15 DIAGNOSIS — R0989 Other specified symptoms and signs involving the circulatory and respiratory systems: Secondary | ICD-10-CM | POA: Insufficient documentation

## 2011-05-15 MED ORDER — NABUMETONE 750 MG PO TABS: 750.0000 mg | ORAL_TABLET | Freq: Two times a day (BID) | ORAL | Status: DC

## 2011-05-15 MED ORDER — LOSARTAN POTASSIUM 50 MG PO TABS: ORAL_TABLET | ORAL | Status: DC

## 2011-05-15 MED ORDER — SPIRONOLACTONE 25 MG PO TABS: 25.0000 mg | ORAL_TABLET | Freq: Every day | ORAL | Status: DC

## 2011-05-15 MED ORDER — AMLODIPINE BESYLATE 10 MG PO TABS: 10.0000 mg | ORAL_TABLET | Freq: Every day | ORAL | Status: DC

## 2011-05-15 MED ORDER — FUROSEMIDE 40 MG PO TABS: 40.0000 mg | ORAL_TABLET | Freq: Every day | ORAL | Status: DC

## 2011-05-15 MED ORDER — ZOLPIDEM TARTRATE 10 MG PO TABS: 10.0000 mg | ORAL_TABLET | Freq: Every evening | ORAL | Status: DC | PRN

## 2011-05-15 NOTE — Progress Notes (Signed)
  Subjective:    Patient ID: [name] [name], female    DOB: 1956/10/05, 55 y.o.   MRN: [id number]  HPI HYPERTENSION  Disease Monitoring  Blood pressure range-13//70 Chest pain- no      Dyspnea- yes, intermittently since her cholecystectomy 12/26/2010, 2 to 3 times per week as though she will pas out, palpitations intermittently  Compliance- good Lightheadedness- yes intermittently   Edema- no  DIABETES  Disease Monitoring  Blood Sugar ranges-90 to 110 Polyuria- no New Visual problems- no  Compliance- good  Hypoglycemic symptoms- no  Last eye exam: 2011, has appt in mid August      Podiatry visit: not regular  HYPERLIPIDEMIA  Compliance- good RUQ pain- no  Muscle aches- no   REGULAR EXERCISE-no      Review of Systems See HPI   Denies recent fever or chills. Denies sinus pressure, nasal congestion, ear pain or sore throat. Denies chest congestion, productive cough or wheezing. Denies abdominal pain, nausea, vomiting,diarrhea or constipation.   Denies dysuria, frequency, hesitancy or incontinence.  Denies headaches, seizures, numbness, or tingling. Chronic anxiety intolerant of buspar Denies skin break down or rash.        Objective:   Physical Exam Patient alert and oriented and in no cardiopulmonary distress.  HEENT: No facial asymmetry, EOMI, no sinus tenderness,  oropharynx pink and moist.  Neck supple no adenopathy. Bilateral bruits  Chest: Clear to auscultation bilaterally.  CVS: S1, S2 no murmurs, no S3.  ABD: Soft non tender. Bowel sounds normal.  Ext: No edema  MS: Adequate ROM spine, shoulders, hips and knees.  Skin: Intact, no ulcerations or rash noted.  Psych: Good eye contact, normal affect. Memory intact  Mildly  anxious not  depressed appearing.  CNS: CN 2-12 intact, power, tone and sensation normal throughout.        Assessment & Plan:   No problem-specific assessment & plan notes found for this encounter.

## 2011-05-15 NOTE — Patient Instructions (Addendum)
CPE in 4 months.  We will schedule your mammogram per your request.  Pls call back to the GI doc,about your colonscopy.  You will be referred to cardiology to evaluate 4 month h/o intermittent dyspnea, palpitations, light headedness, also you will be referred for carotid doppler  To evaluate for possible blockage in the neck  It is important that you exercise regularly at least 30 minutes 5 times a week. If you develop chest pain, have severe difficulty breathing, or feel very tired, stop exercising immediately and seek medical attention     A healthy diet is rich in fruit, vegetables and whole grains. Poultry fish, nuts and beans are a healthy choice for protein rather then red meat. A low sodium diet and drinking 64 ounces of water daily is generally recommended. Oils and sweet should be limited. Carbohydrates especially for those who are diabetic or overweight, should be limited to 34-45 gram per meal. It is important to eat on a regular schedule, at least 3 times daily. Snacks should be primarily fruits, vegetables or nuts.  hBA1C , cmp and eGFR, lipid  Fasting bEFORE next visit

## 2011-05-16 LAB — LIPID PANEL
Cholesterol: 121 mg/dL (ref 0–200)
HDL: 46 mg/dL (ref >39)
Total CHOL/HDL Ratio: 2.6 Ratio
Triglycerides: 82 mg/dL (ref <150)

## 2011-05-16 LAB — COMPLETE METABOLIC PANEL WITH GFR
Alkaline Phosphatase: 130 U/L — ABNORMAL HIGH (ref 39–117)
BUN: 13 mg/dL (ref 6–23)
CO2: 29 mEq/L (ref 19–32)
Creat: 0.79 mg/dL (ref 0.50–1.10)
GFR, Est African American: >60 mL/min (ref >60)
GFR, Est Non African American: >60 mL/min (ref >60)
Glucose, Bld: 122 mg/dL — ABNORMAL HIGH (ref 70–99)
Sodium: 139 mEq/L (ref 135–145)
Total Bilirubin: 0.4 mg/dL (ref 0.3–1.2)
Total Protein: 7.4 g/dL (ref 6.0–8.3)

## 2011-05-16 LAB — HEMOGLOBIN A1C
Hgb A1c MFr Bld: 6.4 % — ABNORMAL HIGH (ref <5.7)
Mean Plasma Glucose: 137 mg/dL — ABNORMAL HIGH (ref <117)

## 2011-05-17 ENCOUNTER — Ambulatory Visit (HOSPITAL_COMMUNITY)
Admission: RE | Admit: 2011-05-17 | Discharge: 2011-05-17 | Disposition: A | Discharge status: Home / Self Care | Payer: Medicare HMO | Source: Ambulatory Visit | Attending: Family Medicine | Admitting: Family Medicine

## 2011-05-17 DIAGNOSIS — H538 Other visual disturbances: Secondary | ICD-10-CM | POA: Insufficient documentation

## 2011-05-17 DIAGNOSIS — R0989 Other specified symptoms and signs involving the circulatory and respiratory systems: Secondary | ICD-10-CM | POA: Insufficient documentation

## 2011-05-17 DIAGNOSIS — I10 Essential (primary) hypertension: Secondary | ICD-10-CM | POA: Insufficient documentation

## 2011-05-17 DIAGNOSIS — E119 Type 2 diabetes mellitus without complications: Secondary | ICD-10-CM | POA: Insufficient documentation

## 2011-05-18 ENCOUNTER — Encounter: Payer: Self-pay | Admitting: Cardiology

## 2011-05-22 ENCOUNTER — Ambulatory Visit (HOSPITAL_COMMUNITY)
Admission: RE | Admit: 2011-05-22 | Discharge: 2011-05-22 | Disposition: A | Discharge status: Home / Self Care | Payer: Medicare HMO | Source: Ambulatory Visit | Attending: Family Medicine | Admitting: Family Medicine

## 2011-05-22 DIAGNOSIS — Z1239 Encounter for other screening for malignant neoplasm of breast: Secondary | ICD-10-CM

## 2011-05-22 DIAGNOSIS — Z1231 Encounter for screening mammogram for malignant neoplasm of breast: Secondary | ICD-10-CM | POA: Insufficient documentation

## 2011-05-31 ENCOUNTER — Encounter: Payer: Self-pay | Admitting: Cardiology

## 2011-05-31 ENCOUNTER — Telehealth: Payer: Self-pay | Admitting: Family Medicine

## 2011-05-31 ENCOUNTER — Ambulatory Visit (INDEPENDENT_AMBULATORY_CARE_PROVIDER_SITE_OTHER): Payer: Medicare HMO | Admitting: Cardiology

## 2011-05-31 DIAGNOSIS — R002 Palpitations: Secondary | ICD-10-CM

## 2011-05-31 DIAGNOSIS — R0989 Other specified symptoms and signs involving the circulatory and respiratory systems: Secondary | ICD-10-CM

## 2011-05-31 DIAGNOSIS — I10 Essential (primary) hypertension: Secondary | ICD-10-CM

## 2011-05-31 DIAGNOSIS — E782 Mixed hyperlipidemia: Secondary | ICD-10-CM

## 2011-05-31 NOTE — Assessment & Plan Note (Signed)
Noted by Dr. Lodema Hong. Recent carotid Dopplers show only minimal carotid plaquing with no hemodynamically significant stenoses. Recommend risk factor modification strategies.

## 2011-05-31 NOTE — Progress Notes (Signed)
Clinical Summary [name] [name] is a 55 y.o.female presenting for cardiology consultation in referral from Dr. Lodema Hong. She reports a history of intermittent dizziness and palpitations dating back to March of this year. She states that generally the symptoms have been less frequent within the last few months. She reports a sensation of rapid heartbeat and "flutters" that happen sometimes at rest, sometimes with exertion. Symptoms last for only a few minutes. She has had a sensation of lightheadedness and fatigue with the episodes, although no syncope or chest pain.  Lab work from July showed cholesterol 121, triglycerides 82, HDL 46, LDL 59. Recent carotid Doppler from early August demonstrated minimal plaque at the carotid bifurcations with no hemodynamically significant stenoses. Faxed copy of ECG from June showed sinus bradycardia at 56 with borderline LVH.  She reports no exertional chest pain. Did have some recent upper left arm discomfort after a venipuncture, although no typical reproducible left arm discomfort with exertion.  She has no documented history of CAD or cardiomyopathy. Echocardiogram from 2007 is noted below.  She has never worn a cardiac monitor.   Allergies  Allergen Reactions  . Lisinopril     REACTION: cough  . Buspar (Buspirone Hcl) Palpitations    Medication list reviewed.  Past Medical History  Diagnosis Date  . Anxiety   . Depression   . GERD (gastroesophageal reflux disease)   . Mixed hyperlipidemia   . Essential hypertension, benign   . Low back pain   . Osteoarthritis   . Constipation   . Varicose veins   . Positive H. pylori test   . Glaucoma     Last eye exam 9/08  . Hiatal hernia   . Schatzki's ring   . Diabetes mellitus, type 2   . TIA (transient ischemic attack)     6/10  . Iron deficiency anemia     Past Surgical History  Procedure Date  . Lumbar discetomy 10/01    Dr. Fabiola Backer   . Tubal ligation 1994  . Cyst resection from left foot 1999    . Esophagogastroduodenoscopy 01/2006    Esophageal dilation   . Colonoscopy 01/2006  . Cholecystectomy Dec 26, 2010  . Umbilical hernia repair 12/26/2010  . Spine surgery     Family History  Problem Relation Age of Onset  . Hypertension Father   . Diabetes Mother   . Arrhythmia Daughter     Defibrillator   . Scoliosis Daughter     Social History Ms. Gillison reports that she has never smoked. She does not have any smokeless tobacco history on file. Ms. Mallen reports that she does not drink alcohol.  Review of Systems Reports intermittent left lower quadrant discomfort, states that she is pending colonoscopy. No reported melena or hematochezia. No orthopnea or PND. States she has had some "vein problems" in her left leg at times, not bothering her recently. Otherwise negative.  Physical Examination Filed Vitals:   05/31/11 1347  BP: 118/82  Pulse: 83  Obese woman in no acute distress. HEENT: Conjunctiva and lids normal, oropharynx moist mucosa. Neck: Supple, no elevated JVP or significant bruits, no thyromegaly. Lungs: Clear to auscultation, nonlabored. Cardiac: Regular rate and rhythm with probable systolic flow murmur at the base, preserved second heart sound, no S3 gallop. Abdomen: Obese, nontender, bowel sounds present. Skin: Warm and dry. Extremities: No pitting edema, distal pulses 1-2+. Musculoskeletal: No kyphosis. Neuropsychiatric: Alert and oriented x3, somewhat tangential historian.  ECG Normal sinus rhythm at 73 with borderline LVH.  Studies Echocardiogram 09/19/2006: Mild basal septal hypertrophy with normal LVEF, mild to moderate mitral annular calcification with borderline prolapse and minimal regurgitation, trivial tricuspid regurgitation.  Problem List and Plan

## 2011-05-31 NOTE — Assessment & Plan Note (Signed)
Intermittent as noted above, associated with a feeling of lightheadedness, but not frank syncope. Baseline ECG is relatively nonspecific with mild LVH. Echocardiogram from 2007 demonstrated no major cardiac structural abnormalities. She has a probable soft flow murmur on examination. Plan is to proceed with a 30 day event recorder for more objective assessment to exclude any definite arrhythmias. We will inform her of the results, and can see her back if further evaluation is to be considered.

## 2011-05-31 NOTE — Assessment & Plan Note (Signed)
Her blood pressure looks good today. Importantly, patient is not noted to be orthostatic either.

## 2011-05-31 NOTE — Patient Instructions (Signed)
Your physician has recommended that you wear an event monitor. Event monitors are medical devices that record the heart's electrical activity. Doctors most often [place] these monitors to diagnose arrhythmias. Arrhythmias are problems with the speed or rhythm of the heartbeat. The monitor is a small, portable device. You can wear one while you do your normal daily activities. This is usually used to diagnose what is causing palpitations/syncope (passing out).  We will contact you with results of Event monitor.

## 2011-05-31 NOTE — Assessment & Plan Note (Signed)
Recent lipid results showed good LDL control.

## 2011-05-31 NOTE — Telephone Encounter (Signed)
Patient wants to know she she go for colonoscopy first or wear heart monitor (per cardiology) for 30 days first

## 2011-05-31 NOTE — Telephone Encounter (Signed)
i suggest she do the cardiology eval first

## 2011-06-02 NOTE — Telephone Encounter (Signed)
Patient aware.

## 2011-06-03 DIAGNOSIS — R002 Palpitations: Secondary | ICD-10-CM

## 2011-06-14 ENCOUNTER — Encounter: Payer: Self-pay | Admitting: Cardiology

## 2011-06-16 ENCOUNTER — Telehealth: Payer: Self-pay | Admitting: Internal Medicine

## 2011-06-16 ENCOUNTER — Telehealth: Payer: Self-pay | Admitting: Nurse Practitioner

## 2011-06-16 NOTE — Telephone Encounter (Signed)
Tried to call patient.  Not home.  Didn't answer cell. [employer] faxed strips showing SVT up to rate 200. Spoke with patient's mother.  Left number to call  Will try to call back.as well.

## 2011-06-16 NOTE — Telephone Encounter (Signed)
Ms Cales called back stating that she did have brief palps earlier.  This is the first time it happened since being placed on her monitor.  She's currently asymptomatic.  I advised that we will arrange for f/u sooner rather than later to discuss further medical mgmt vs. Catheter based therapies for svt.  She was grateful for the call back.  I did not call in a Rx for BB/CCB @ this time given paucity of occurrences and brief nature of episode.

## 2011-06-28 ENCOUNTER — Ambulatory Visit (INDEPENDENT_AMBULATORY_CARE_PROVIDER_SITE_OTHER): Payer: Medicare HMO | Admitting: Family Medicine

## 2011-06-28 ENCOUNTER — Encounter: Payer: Self-pay | Admitting: Family Medicine

## 2011-06-28 DIAGNOSIS — R5383 Other fatigue: Secondary | ICD-10-CM

## 2011-06-28 DIAGNOSIS — L293 Anogenital pruritus, unspecified: Secondary | ICD-10-CM

## 2011-06-28 DIAGNOSIS — R5381 Other malaise: Secondary | ICD-10-CM

## 2011-06-28 DIAGNOSIS — M79609 Pain in unspecified limb: Secondary | ICD-10-CM

## 2011-06-28 DIAGNOSIS — B3731 Acute candidiasis of vulva and vagina: Secondary | ICD-10-CM

## 2011-06-28 DIAGNOSIS — M79602 Pain in left arm: Secondary | ICD-10-CM | POA: Insufficient documentation

## 2011-06-28 DIAGNOSIS — G4730 Sleep apnea, unspecified: Secondary | ICD-10-CM

## 2011-06-28 DIAGNOSIS — L292 Pruritus vulvae: Secondary | ICD-10-CM | POA: Insufficient documentation

## 2011-06-28 DIAGNOSIS — I10 Essential (primary) hypertension: Secondary | ICD-10-CM

## 2011-06-28 DIAGNOSIS — Z23 Encounter for immunization: Secondary | ICD-10-CM

## 2011-06-28 DIAGNOSIS — B373 Candidiasis of vulva and vagina: Secondary | ICD-10-CM

## 2011-06-28 DIAGNOSIS — E119 Type 2 diabetes mellitus without complications: Secondary | ICD-10-CM

## 2011-06-28 MED ORDER — GABAPENTIN 100 MG PO CAPS: 100.0000 mg | ORAL_CAPSULE | Freq: Every day | ORAL | Status: DC

## 2011-06-28 MED ORDER — FLUCONAZOLE 150 MG PO TABS: ORAL_TABLET | ORAL | Status: DC

## 2011-06-28 MED ORDER — KETOCONAZOLE 2 % EX CREA: TOPICAL_CREAM | CUTANEOUS | Status: AC

## 2011-06-28 MED ORDER — INFLUENZA VAC TYPES A & B PF IM SUSP: 0.5000 mL | Freq: Once | INTRAMUSCULAR | Status: DC

## 2011-06-28 NOTE — Patient Instructions (Addendum)
CPE as before.  You will get new med for nerve pain .  You will be referred for a sleep study.  I will send specimens for vaginal itch  Flu vaccine  today

## 2011-06-28 NOTE — Progress Notes (Signed)
  Subjective:    Patient ID: [name] [name], female    DOB: [birth year], 55 y.o.   MRN: [id number]  HPI Pain and numbness in left upper extremity with numbness at times, throbbing at night waking up pt AT NIGHT.Pain radiates to shoulder to hand thumb and index, states this all started after venipuncture approx 6 weeks ago.She does have a h/o neck pain and stiffness sTATES SHE HAS EXTREME fatigue with snoring. C/o severe vulvovaginal itching, whichon review of her chart , is not a new complaint Review of Systems See HPI Denies recent fever or chills. Denies sinus pressure, nasal congestion, ear pain or sore throat. Denies chest congestion, productive cough or wheezing. Denies chest pains, palpitations and leg swelling Denies abdominal pain, nausea, vomiting,diarrhea or constipation.   Denies dysuria, frequency, hesitancy or incontinence.  Denies headaches, seizures, numbness, or tingling. Reports anxiety, states she just has not been right since her cholecystectomy       Objective:   Physical Exam Patient alert and oriented and in no cardiopulmonary distress.  HEENT: No facial asymmetry, EOMI, no sinus tenderness,  oropharynx pink and moist.  Neck supple no adenopathy.  Chest: Clear to auscultation bilaterally.  CVS: S1, S2 no murmurs, no S3.  ABD: Soft non tender. Bowel sounds normal. GENITAL: clear vaginal d/c  Ext: No edema  MS: Adequate ROM spine, shoulders, hips and knees.  Skin: Intact,erythema and rash of vulva  Psych: Good eye contact, normal affect. Memory intact not anxious or depressed appearing.  CNS: CN 2-12 intact, power, tone and sensation normal throughout.        Assessment & Plan:

## 2011-06-29 DIAGNOSIS — I10 Essential (primary) hypertension: Secondary | ICD-10-CM | POA: Insufficient documentation

## 2011-06-29 NOTE — Assessment & Plan Note (Signed)
No evidence of phlebitis or cellulitis, pt to take anti-inflammatory med, i believe the symptoms are from arthritic neck problems

## 2011-06-29 NOTE — Assessment & Plan Note (Signed)
Controlled, no change in medication  

## 2011-06-29 NOTE — Assessment & Plan Note (Signed)
Fluconazole prescribed

## 2011-06-29 NOTE — Assessment & Plan Note (Addendum)
Antifungal cream to be applied topically, wet prep and culture also sent

## 2011-06-30 LAB — GC/CHLAMYDIA PROBE AMP, GENITAL: GC Probe Amp, Genital: NEGATIVE

## 2011-07-04 ENCOUNTER — Encounter (INDEPENDENT_AMBULATORY_CARE_PROVIDER_SITE_OTHER): Payer: Self-pay | Admitting: *Deleted

## 2011-07-04 NOTE — Telephone Encounter (Signed)
Patient needs to reschedule TCS--was scheduled and had to have surgery.  Wants to reschedule.  She know it will not be this week or even next week before she receives a return call.

## 2011-07-05 NOTE — Telephone Encounter (Signed)
Spoke to patient, made her aware her appt was for an office visit back in march and she was ok to schedule office visit for abd pain, dysphagia

## 2011-07-25 ENCOUNTER — Telehealth (INDEPENDENT_AMBULATORY_CARE_PROVIDER_SITE_OTHER): Payer: Self-pay | Admitting: *Deleted

## 2011-07-25 ENCOUNTER — Encounter (INDEPENDENT_AMBULATORY_CARE_PROVIDER_SITE_OTHER): Payer: Self-pay | Admitting: Internal Medicine

## 2011-07-25 ENCOUNTER — Telehealth: Payer: Self-pay | Admitting: Family Medicine

## 2011-07-25 ENCOUNTER — Ambulatory Visit (INDEPENDENT_AMBULATORY_CARE_PROVIDER_SITE_OTHER): Payer: Medicare HMO | Admitting: Internal Medicine

## 2011-07-25 DIAGNOSIS — R1310 Dysphagia, unspecified: Secondary | ICD-10-CM

## 2011-07-25 DIAGNOSIS — R109 Unspecified abdominal pain: Secondary | ICD-10-CM

## 2011-07-25 LAB — COMPREHENSIVE METABOLIC PANEL
AST: 19 U/L (ref 0–37)
Albumin: 4.2 g/dL (ref 3.5–5.2)
Alkaline Phosphatase: 150 U/L — ABNORMAL HIGH (ref 39–117)
Glucose, Bld: 100 mg/dL — ABNORMAL HIGH (ref 70–99)
Potassium: 4.8 mEq/L (ref 3.5–5.3)
Sodium: 140 mEq/L (ref 135–145)
Total Protein: 7.6 g/dL (ref 6.0–8.3)

## 2011-07-25 NOTE — Telephone Encounter (Signed)
There was a problem with the lab order patient presented to lab with. I corrected it and I am refaxing it

## 2011-07-25 NOTE — Progress Notes (Signed)
Subjective:     Patient ID: [name] [name], female   DOB: [birth year], 55 y.o.   MRN: [id number]  HPI Zannah is a referral from Dr. Lodema Hong across her upper abdomen.  She has been hurting ever since her gallbladder surgery in March. The pain comes and goes. She says she really has not felt good since she had her gallbladder surgery for cholelithiasis.  Appetite is good. No recent weight loss.  She does not have any pain at this time.  She has a BM one every other day.  No fever. No melena or bright red rectal bleeding.  She occasionally has dysphagia.  Feels like food is lodging in her esophagus. Symptoms for months. She will drink water for the bolus to go down.  She had a GB and hernia repair in March of this year by Dr. Lovell Sheehan.   01/21/2006 EGD/ED followed by a colonoscopy FINAL DIAGNOSIS:  1. Esophageal web which was disrupted by passing 56-[place] Maloney dilator.  2. Noncritical Schatzki's ring which was intact following esophageal  dilation.  3. Small sliding hiatal hernia.  4. Erosive bulbar duodenitis.  5. A few diverticula at the ascending colon and external hemorrhoids,  otherwise normal colonoscopy.  Review of Systems see hpi Current Outpatient Prescriptions  Medication Sig Dispense Refill  . amLODipine (NORVASC) 10 MG tablet Take 1 tablet (10 mg total) by mouth daily.  90 tablet  1  . aspirin 325 MG tablet Take 325 mg by mouth daily.        . furosemide (LASIX) 40 MG tablet Take 1 tablet (40 mg total) by mouth daily.  90 tablet  1  . gabapentin (NEURONTIN) 100 MG capsule Take 1 capsule (100 mg total) by mouth daily.  30 capsule  2  . liver oil-zinc oxide (DESITIN) 40 % ointment Apply topically as needed.        Marland Kitchen losartan (COZAAR) 50 MG tablet Two tablets once daily effective 02/13/2011, return for re-eval before new script sent  180 tablet  1  . metFORMIN (GLUCOPHAGE) 500 MG tablet Take 500 mg by mouth 3 (three) times daily. Dose increase effective 02/13/2011, pls do not send the  1000mg  tablet      . Multiple Vitamins-Minerals (MULTI FOR HER PO) Take 1 tablet by mouth daily.        . nabumetone (RELAFEN) 750 MG tablet Take 1 tablet (750 mg total) by mouth 2 (two) times daily.  180 tablet  1  . omeprazole (PRILOSEC) 20 MG capsule Take 20 mg by mouth 2 (two) times daily before a meal.        . rosuvastatin (CRESTOR) 10 MG tablet Take 1 tablet (10 mg total) by mouth daily. Take one tablet by mouth at bedtime dose increase effective 11/03/2010, pt, will finish the 5 mg tablets taking 2 at night  90 tablet  0  . spironolactone (ALDACTONE) 25 MG tablet Take 1 tablet (25 mg total) by mouth daily.  90 tablet  1  . zolpidem (AMBIEN) 10 MG tablet Take 1 tablet (10 mg total) by mouth at bedtime as needed. For insomnia    90 tablet  1  . Alcohol Swabs (B-D SINGLE USE SWABS REGULAR) PADS       . fluconazole (DIFLUCAN) 150 MG tablet One tablet once daily for 3 days  3 tablet  0  . ketoconazole (NIZORAL) 2 % cream Apply to rash on vulva, nOT internally , twice daily for 10 days, then as  needed  60  g  0  . Polyethylene Glycol 3350-GRX POWD Take by mouth. 1 capful in 8 ounces of water         Current Facility-Administered Medications  Medication Dose Route Frequency Provider Last Rate Last Dose  . Influenza (>/= 3 years) inactive virus vaccine (FLVIRIN/FLUZONE) injection SUSP 0.5 mL  0.5 mL Intramuscular Once Syliva Overman, MD       Past Surgical History  Procedure Date  . Lumbar discetomy 10/01    Dr. Fabiola Backer   . Tubal ligation 1994  . Cholecystectomy Dec 26, 2010  . Umbilical hernia repair 12/26/2010  . Spine surgery   . Colonoscopy 01/2006  . Esophagogastroduodenoscopy     Esophageal dilation 2007   History   Social History Narrative   Lives with children   Current Outpatient Prescriptions  Medication Sig Dispense Refill  . amLODipine (NORVASC) 10 MG tablet Take 1 tablet (10 mg total) by mouth daily.  90 tablet  1  . aspirin 325 MG tablet Take 325 mg by mouth daily.         . furosemide (LASIX) 40 MG tablet Take 1 tablet (40 mg total) by mouth daily.  90 tablet  1  . gabapentin (NEURONTIN) 100 MG capsule Take 1 capsule (100 mg total) by mouth daily.  30 capsule  2  . liver oil-zinc oxide (DESITIN) 40 % ointment Apply topically as needed.        Marland Kitchen losartan (COZAAR) 50 MG tablet Two tablets once daily effective 02/13/2011, return for re-eval before new script sent  180 tablet  1  . metFORMIN (GLUCOPHAGE) 500 MG tablet Take 500 mg by mouth 3 (three) times daily. Dose increase effective 02/13/2011, pls do not send the 1000mg  tablet      . Multiple Vitamins-Minerals (MULTI FOR HER PO) Take 1 tablet by mouth daily.        . nabumetone (RELAFEN) 750 MG tablet Take 1 tablet (750 mg total) by mouth 2 (two) times daily.  180 tablet  1  . omeprazole (PRILOSEC) 20 MG capsule Take 20 mg by mouth 2 (two) times daily before a meal.        . rosuvastatin (CRESTOR) 10 MG tablet Take 1 tablet (10 mg total) by mouth daily. Take one tablet by mouth at bedtime dose increase effective 11/03/2010, pt, will finish the 5 mg tablets taking 2 at night  90 tablet  0  . spironolactone (ALDACTONE) 25 MG tablet Take 1 tablet (25 mg total) by mouth daily.  90 tablet  1  . zolpidem (AMBIEN) 10 MG tablet Take 1 tablet (10 mg total) by mouth at bedtime as needed. For insomnia    90 tablet  1  . Alcohol Swabs (B-D SINGLE USE SWABS REGULAR) PADS       . fluconazole (DIFLUCAN) 150 MG tablet One tablet once daily for 3 days  3 tablet  0  . ketoconazole (NIZORAL) 2 % cream Apply to rash on vulva, nOT internally , twice daily for 10 days, then as  needed  60 g  0  . Polyethylene Glycol 3350-GRX POWD Take by mouth. 1 capful in 8 ounces of water         Current Facility-Administered Medications  Medication Dose Route Frequency Provider Last Rate Last Dose  . Influenza (>/= 3 years) inactive virus vaccine (FLVIRIN/FLUZONE) injection SUSP 0.5 mL  0.5 mL Intramuscular Once Syliva Overman, MD       Family  History  Problem Relation Age of Onset  .  Hypertension Father   . Diabetes Mother   . Arrhythmia Daughter     Defibrillator   . Scoliosis Daughter    History   Social History Narrative   Lives with children       Objective:   Physical Exam Filed Vitals:   07/25/11 1127  BP: 124/72  Pulse: 72  Temp: 97.7 F (36.5 C)  Height: 5\' 5"  (1.651 m)  Weight: 238 lb 8 oz (108.183 kg)   Alert and oriented. Skin warm and dry. Oral mucosa is moist. Natural teeth in good condition. Sclera anicteric, conjunctivae is pink. Thyroid not enlarged. No cervical lymphadenopathy. Lungs clear. Heart regular rate and rhythm.  Abdomen is soft. Bowel sounds are positive. No hepatomegaly. No abdominal masses felt. No tenderness.  No edema to lower extremities. Patient is alert and oriented.      Assessment:    Rt and left upper quadrant pain- post cholecystectomy for a gallstone.   Plan:    Esophogram, US abdomen, C met.  Will give samples of Prilosec 20mg  : Take 30 minuts before breakfast. Further recommendations once we have the [place] and esophogram back. She is satisified with the care she received today.

## 2011-07-27 ENCOUNTER — Other Ambulatory Visit: Payer: Self-pay | Admitting: Family Medicine

## 2011-07-28 ENCOUNTER — Ambulatory Visit (HOSPITAL_COMMUNITY)
Admission: RE | Admit: 2011-07-28 | Discharge: 2011-07-28 | Disposition: A | Discharge status: Home / Self Care | Payer: Medicare HMO | Source: Ambulatory Visit | Attending: Internal Medicine | Admitting: Internal Medicine

## 2011-07-28 DIAGNOSIS — R109 Unspecified abdominal pain: Secondary | ICD-10-CM | POA: Insufficient documentation

## 2011-07-28 DIAGNOSIS — R1310 Dysphagia, unspecified: Secondary | ICD-10-CM | POA: Insufficient documentation

## 2011-07-28 DIAGNOSIS — R160 Hepatomegaly, not elsewhere classified: Secondary | ICD-10-CM | POA: Insufficient documentation

## 2011-08-02 NOTE — Progress Notes (Signed)
This has been addressed.

## 2011-08-03 ENCOUNTER — Other Ambulatory Visit (INDEPENDENT_AMBULATORY_CARE_PROVIDER_SITE_OTHER): Payer: Self-pay | Admitting: *Deleted

## 2011-08-08 ENCOUNTER — Other Ambulatory Visit: Payer: Self-pay | Admitting: Family Medicine

## 2011-08-08 DIAGNOSIS — M79602 Pain in left arm: Secondary | ICD-10-CM

## 2011-08-08 NOTE — Telephone Encounter (Signed)
C/o nocturnal pain in left elbow up to the shoulder esp at night, despite medication , she will be referred to Dr Hilda Lias about this per request, has upper endo this Friday.  Pls refer to Dr Hilda Lias next week re left elbow and left shoulder pain, referral entered

## 2011-08-09 NOTE — Telephone Encounter (Signed)
Pt was referred to dr. Jenetta Downer office. She is also aware of his office calling back with appt and time. If not to call our office and advise [place] so we can check on it.

## 2011-08-10 MED ORDER — SODIUM CHLORIDE 0.45 % IV SOLN: Freq: Once | INTRAVENOUS | Status: DC

## 2011-08-11 ENCOUNTER — Encounter (HOSPITAL_COMMUNITY)
Admission: RE | Disposition: A | Discharge status: Home / Self Care | Payer: Self-pay | Source: Ambulatory Visit | Attending: Internal Medicine

## 2011-08-11 ENCOUNTER — Encounter (HOSPITAL_COMMUNITY): Payer: Self-pay | Admitting: *Deleted

## 2011-08-11 ENCOUNTER — Ambulatory Visit (HOSPITAL_COMMUNITY)
Admission: RE | Admit: 2011-08-11 | Discharge: 2011-08-11 | Disposition: A | Discharge status: Home / Self Care | Payer: Medicare HMO | Source: Ambulatory Visit | Attending: Internal Medicine | Admitting: Internal Medicine

## 2011-08-11 DIAGNOSIS — Z79899 Other long term (current) drug therapy: Secondary | ICD-10-CM | POA: Insufficient documentation

## 2011-08-11 DIAGNOSIS — K222 Esophageal obstruction: Secondary | ICD-10-CM | POA: Insufficient documentation

## 2011-08-11 DIAGNOSIS — Z01812 Encounter for preprocedural laboratory examination: Secondary | ICD-10-CM | POA: Insufficient documentation

## 2011-08-11 DIAGNOSIS — K314 Gastric diverticulum: Secondary | ICD-10-CM | POA: Insufficient documentation

## 2011-08-11 DIAGNOSIS — R1310 Dysphagia, unspecified: Secondary | ICD-10-CM | POA: Insufficient documentation

## 2011-08-11 DIAGNOSIS — E119 Type 2 diabetes mellitus without complications: Secondary | ICD-10-CM | POA: Insufficient documentation

## 2011-08-11 DIAGNOSIS — K225 Diverticulum of esophagus, acquired: Secondary | ICD-10-CM

## 2011-08-11 DIAGNOSIS — I10 Essential (primary) hypertension: Secondary | ICD-10-CM | POA: Insufficient documentation

## 2011-08-11 DIAGNOSIS — K219 Gastro-esophageal reflux disease without esophagitis: Secondary | ICD-10-CM

## 2011-08-11 DIAGNOSIS — R109 Unspecified abdominal pain: Secondary | ICD-10-CM

## 2011-08-11 DIAGNOSIS — K449 Diaphragmatic hernia without obstruction or gangrene: Secondary | ICD-10-CM

## 2011-08-11 SURGERY — ESOPHAGOGASTRODUODENOSCOPY (EGD) WITH ESOPHAGEAL DILATION
Anesthesia: Moderate Sedation

## 2011-08-11 MED ORDER — MIDAZOLAM HCL 5 MG/5ML IJ SOLN
INTRAMUSCULAR | Status: DC | PRN
  Administered 2011-08-11 (×3): 2 mg via INTRAVENOUS
  Administered 2011-08-11: 1 mg via INTRAVENOUS
  Administered 2011-08-11 (×4): 2 mg via INTRAVENOUS

## 2011-08-11 MED ORDER — MEPERIDINE HCL 50 MG/ML IJ SOLN
INTRAMUSCULAR | Status: AC
  Filled 2011-08-11: qty 1

## 2011-08-11 MED ORDER — BUTAMBEN-TETRACAINE-BENZOCAINE 2-2-14 % EX AERO
INHALATION_SPRAY | CUTANEOUS | Status: DC | PRN
  Administered 2011-08-11: 2 via TOPICAL

## 2011-08-11 MED ORDER — LIDOCAINE 5 % EX PTCH: 1.0000 | MEDICATED_PATCH | CUTANEOUS | Status: AC

## 2011-08-11 MED ORDER — MIDAZOLAM HCL 5 MG/5ML IJ SOLN
INTRAMUSCULAR | Status: AC
  Filled 2011-08-11: qty 5

## 2011-08-11 MED ORDER — SODIUM CHLORIDE 0.45 % IV SOLN
INTRAVENOUS | Status: DC
  Administered 2011-08-11: 14:00:00 via INTRAVENOUS

## 2011-08-11 MED ORDER — MEPERIDINE HCL 25 MG/ML IJ SOLN
INTRAMUSCULAR | Status: DC | PRN
  Administered 2011-08-11 (×2): 25 mg via INTRAVENOUS

## 2011-08-11 MED ORDER — STERILE WATER FOR IRRIGATION IR SOLN
Status: DC | PRN
  Administered 2011-08-11: 14:00:00

## 2011-08-11 MED ORDER — MIDAZOLAM HCL 5 MG/5ML IJ SOLN
INTRAMUSCULAR | Status: AC
  Filled 2011-08-11: qty 10

## 2011-08-11 NOTE — Op Note (Signed)
EGD PROCEDURE REPORT  PATIENT:  [name] GOGUEN  MR#:  [id number] Birthdate:  August 12, 1956, 55 y.o., female Endoscopist:  Dr. Malissa Hippo, MD Referred By:  Dr. Syliva Overman, M.D. Procedure Date: 08/11/2011  Procedure:   EGD with ED.  Indications:  Patient is 55 year old Afro-American female who has dysphagia to solids and at times with pills she has history of esophageal web and the ring and was last dilated in 2007. Recent barium study revealed and 3 holdup of barium pill in the proximal esophagus. He also complains of pain across her upper abdomen she said since her cholecystectomy 7 months ago. Her heartburn is well controlled with PPI. She is undergoing diagnostic/therapeutic EGD.            Informed Consent: Procedure and risks were reviewed with the patient and informed consent was obtained. Medications:  Demerol 50 mg IV Versed 15 mg IV Cetacaine spray topically for oropharyngeal anesthesia  Description of procedure:  The endoscope was introduced through the mouth and advanced to the second portion of the duodenum without difficulty or limitations. The mucosal surfaces were surveyed very carefully during advancement of the scope and upon withdrawal.  Findings:  Esophagus:  Esophageal web noted just below UES picture could not be taken. Esophageal mucosa was otherwise normal. No ring or stricture noted distally. GEJ:  35 cm Hiatus: 37 cm Stomach: Stomach was empty and distended very well with insufflation. Folds in the proximal stomach were normal. Examination of mucosa at body antrum pyloric channel as well as angularis fundus and cardia was normal. Single small fundal diverticulum noted. Duodenum:  Normal bulbar and post bulbar mucosa.  Therapeutic/Diagnostic Maneuvers Performed:  Esophagus was dilated by passing 56 [place] Maloney dilator  disrupting esophageal web. Distally just oximeter GE junction there was a small area with ecchymosis.  Complications:   None  Impression: Esophageal web disrupted by passing 56 [place] Maloney dilator. Small sliding-type hernia Subdental finding of small diverticulum at gastric fundus also noted on upper GI series.  Recommendations:  Continue anti-reflex measures and PPI. Will try her on Lidoderm patch for upper abdominal pain. She will divide each patch into 2 and apply as directed; 12 hours on and 12hours off. Office visit in 4 weeks.  REHMAN,NAJEEB U  08/11/2011  2:44 PM  CC: Dr. Syliva Overman, MD, MD & Dr. Bonnetta Barry ref. provider found

## 2011-08-11 NOTE — H&P (Addendum)
This is an update to history and physical from 07/25/2011. Patient is not taking gabapentin anymore. There has been no change in her abdominal pain which is across her upper abdomen and she's had this since her cholecystectomy in March this year for symptomatic cholelithiasis. she is mildly tender in her epigastric region and under both costal margins. No organomegaly noted She also complains of dysphagia. He is undergoing diagnostic EGD and possible esophageal dilation. She had barium pill study and there was slight delay in passage of pill through  proximal/thoracic esophagus.

## 2011-08-15 ENCOUNTER — Encounter: Payer: Self-pay | Admitting: Cardiology

## 2011-08-22 ENCOUNTER — Other Ambulatory Visit (HOSPITAL_COMMUNITY): Payer: Self-pay | Admitting: Orthopaedic Surgery

## 2011-08-22 DIAGNOSIS — M542 Cervicalgia: Secondary | ICD-10-CM

## 2011-08-25 ENCOUNTER — Ambulatory Visit (HOSPITAL_COMMUNITY)
Admission: RE | Admit: 2011-08-25 | Discharge: 2011-08-25 | Disposition: A | Discharge status: Home / Self Care | Payer: Medicare HMO | Source: Ambulatory Visit | Attending: Orthopaedic Surgery | Admitting: Orthopaedic Surgery

## 2011-08-25 DIAGNOSIS — M542 Cervicalgia: Secondary | ICD-10-CM

## 2011-08-25 DIAGNOSIS — M5020 Other cervical disc displacement, unspecified cervical region: Secondary | ICD-10-CM | POA: Insufficient documentation

## 2011-08-25 DIAGNOSIS — M5380 Other specified dorsopathies, site unspecified: Secondary | ICD-10-CM | POA: Insufficient documentation

## 2011-09-12 ENCOUNTER — Ambulatory Visit (INDEPENDENT_AMBULATORY_CARE_PROVIDER_SITE_OTHER): Payer: Medicare HMO | Admitting: Internal Medicine

## 2011-09-12 ENCOUNTER — Encounter (INDEPENDENT_AMBULATORY_CARE_PROVIDER_SITE_OTHER): Payer: Self-pay | Admitting: Internal Medicine

## 2011-09-12 DIAGNOSIS — R1011 Right upper quadrant pain: Secondary | ICD-10-CM | POA: Insufficient documentation

## 2011-09-12 DIAGNOSIS — R1032 Left lower quadrant pain: Secondary | ICD-10-CM

## 2011-09-12 DIAGNOSIS — G8929 Other chronic pain: Secondary | ICD-10-CM | POA: Insufficient documentation

## 2011-09-12 MED ORDER — DICYCLOMINE HCL 10 MG PO CAPS: 10.0000 mg | ORAL_CAPSULE | Freq: Three times a day (TID) | ORAL | Status: DC

## 2011-09-12 NOTE — Patient Instructions (Signed)
Dicyclomine 10 mg by mouth 30 minutes before each meal. Take polyethylene glycol half a scoop daily while  on dicyclomine. Abdominopelvic CT to be scheduled

## 2011-09-13 ENCOUNTER — Other Ambulatory Visit: Payer: Self-pay | Admitting: Family Medicine

## 2011-09-13 ENCOUNTER — Other Ambulatory Visit (INDEPENDENT_AMBULATORY_CARE_PROVIDER_SITE_OTHER): Payer: Self-pay | Admitting: *Deleted

## 2011-09-13 ENCOUNTER — Encounter: Payer: Self-pay | Admitting: Family Medicine

## 2011-09-13 NOTE — Telephone Encounter (Signed)
The patient was seen in our office on 09-12-11. At the time of this visit we e-scribed her Pharmacy for Dicyclomine 10 mg take 1 by mouth before meals, 1 month with 11 refills. Apparently the Pharmacy did not rec'v it. I have per Dr. Patty Sermons permission called this to [facility] in [place].

## 2011-09-14 ENCOUNTER — Encounter (INDEPENDENT_AMBULATORY_CARE_PROVIDER_SITE_OTHER): Payer: Self-pay | Admitting: *Deleted

## 2011-09-14 LAB — LIPID PANEL
Cholesterol: 137 mg/dL (ref 0–200)
HDL: 51 mg/dL (ref >39)
Total CHOL/HDL Ratio: 2.7 Ratio
VLDL: 11 mg/dL (ref 0–40)

## 2011-09-14 LAB — COMPLETE METABOLIC PANEL WITH GFR
ALT: <8 U/L (ref 0–35)
AST: 15 U/L (ref 0–37)
CO2: 28 mEq/L (ref 19–32)
Calcium: 9.6 mg/dL (ref 8.4–10.5)
Chloride: 101 mEq/L (ref 96–112)
Creat: 0.81 mg/dL (ref 0.50–1.10)
Sodium: 139 mEq/L (ref 135–145)
Total Bilirubin: 0.4 mg/dL (ref 0.3–1.2)
Total Protein: 7.8 g/dL (ref 6.0–8.3)

## 2011-09-14 LAB — HEMOGLOBIN A1C: Mean Plasma Glucose: 137 mg/dL — ABNORMAL HIGH (ref <117)

## 2011-09-14 NOTE — Progress Notes (Signed)
Presenting complaint; Abdominal pain at RUQ and LLQ. Subjective: Patient is 55 year old Afro-American female who was seen last month for dysphagia and underwent EGD and ED on 08/11/2011 with resolution of her dysphagia. At that time she was complaining of upper abdominal pain primarily in RUQ felt to be musculoskeletal; she was given Lidoderm tried with she did not use this medication. She states her dysphagia has resolved. He complains of pain in right upper quadrant of her abdomen. She states she had similar pain prior to her cholecystectomy; surgery was in March this year. She describes this pain as sharp stabbing pain which lasts only few seconds to minutes. This pain does not occur every day. No relationship of this pain to her meals or physical activity. She states she does not have good appetite however she is not losing any weight. She has one or 2 episodes of heartburn every week relieved with Maalox. Her other pain is located in left lower quadrant of her abdomen and described to be nagging pain relieved with defecation. She also complains as if she has a rubber band around her abdomen at the level of umbilicus. She states she does not feel well. She denies fever chills or night sweats. Her bowels move regularly. No history of melena or rectal bleeding. Her last colonoscopy was in April 2007 revealing few diverticula at ascending colon and external hemorrhoids. She also had her esophagus dilated at that time; he had both esophageal valve and the ring. She's also having problems with left elbow pain and is seeing Dr. Hilda Lias. Current Medications: Amlodipine 10 mg by mouth daily. Aspirin 325 mg by mouth daily. Celecoxib 200 mg by mouth daily. Breasts her 10 mg by mouth daily. Lasix 40 mg by mouth daily when necessary Cord liver oil one capsule by mouth daily. Losartan 100 mg by mouth daily. Metformin 500 mg by mouth daily. MVI one daily. Omeprazole 20 mg by mouth every morning. GlycoLax  17 g by mouth daily Spironolactone 25 mg by mouth daily. And beyond 10 mg by mouth each bedtime when necessary Ketoconazole 2% cream use as directed.   Objective: BP 114/70  Pulse 66  Temp(Src) 97.5 F (36.4 C) (Oral)  Resp 14  Ht 5\' 5"  (1.651 m)  Wt 240 lb (108.863 kg)  BMI 39.94 kg/m2 Conjunctiva is pink.sclerae nonicteric. Oropharyngeal mucosa is normal. No neck masses or thyromegaly noted. Cardiac exam with regular rhythm normal S1 and S2. No murmur or gallop noted.  Lungs are clear to auscultation. Abdomen is protuberant; bowel sounds are normal; and palpation soft abdomen with mild tenderness at LLQ as well as and RUQ.  No organomegaly or masses. Rectal examination deferred. No LE edema or clubbing noted.    Labs/studies  Labs pending as ordered by Dr. Lodema Hong to be done later this week.   Assessment: Patient has 2 types of abdominal pain. She is having right upper quadrant pain which is transient her left lower quadrant pain is nagging and may last longer and relieved with defecation. Suspect both atypical quadrant and LLQ pain argue to IBS although pain in right upper quadrant could be abdominal wall pain or neuropathic pain. GERD symptoms appear to be reasonably well controlled. She is up-to-date pre-screening for CRC. Patient's last abdominal pelvic CT was in February 2012 prior to her cholecystectomy. It may be worth repeating this test.   Plan: Dicyclomine 10 mg by mouth 30 minutes before each meal. Patient advised to take MiraLAX every day that she would not become constipated  while on anti-spasmodic. Abdominal pelvic CT to be scheduled.

## 2011-09-14 NOTE — Telephone Encounter (Signed)
This encounter was created in error - please disregard.

## 2011-09-18 ENCOUNTER — Encounter: Payer: Self-pay | Admitting: Family Medicine

## 2011-09-18 ENCOUNTER — Other Ambulatory Visit (HOSPITAL_COMMUNITY)
Admission: RE | Admit: 2011-09-18 | Discharge: 2011-09-18 | Disposition: A | Discharge status: Home / Self Care | Payer: Medicare HMO | Source: Ambulatory Visit | Attending: Family Medicine | Admitting: Family Medicine

## 2011-09-18 ENCOUNTER — Ambulatory Visit (INDEPENDENT_AMBULATORY_CARE_PROVIDER_SITE_OTHER): Payer: Medicare HMO | Admitting: Family Medicine

## 2011-09-18 VITALS — BP 126/70 | HR 74 | Resp 18 | Ht 65.0 in | Wt 236.1 lb

## 2011-09-18 DIAGNOSIS — Z0000 Encounter for general adult medical examination without abnormal findings: Secondary | ICD-10-CM

## 2011-09-18 DIAGNOSIS — E785 Hyperlipidemia, unspecified: Secondary | ICD-10-CM

## 2011-09-18 DIAGNOSIS — I10 Essential (primary) hypertension: Secondary | ICD-10-CM

## 2011-09-18 DIAGNOSIS — E119 Type 2 diabetes mellitus without complications: Secondary | ICD-10-CM

## 2011-09-18 DIAGNOSIS — Z01419 Encounter for gynecological examination (general) (routine) without abnormal findings: Secondary | ICD-10-CM | POA: Insufficient documentation

## 2011-09-18 DIAGNOSIS — J309 Allergic rhinitis, unspecified: Secondary | ICD-10-CM | POA: Insufficient documentation

## 2011-09-18 LAB — POC HEMOCCULT BLD/STL (OFFICE/1-CARD/DIAGNOSTIC): Fecal Occult Blood, POC: NEGATIVE

## 2011-09-18 MED ORDER — FLUTICASONE PROPIONATE 50 MCG/ACT NA SUSP: 2.0000 | Freq: Every day | NASAL | Status: DC

## 2011-09-18 NOTE — Patient Instructions (Addendum)
F/U in 4 months   HBA1C and chem 7 in 4 months, non fasting just before visit, also microalb  Blood pressure, cholesterol and blood sugar are great.  New medication for uncontrolled allergies  You need to reschedule your eye exam , this is important

## 2011-09-18 NOTE — Progress Notes (Signed)
  Subjective:    Patient ID: [name] [name], female    DOB: 08-31-1956, 55 y.o.   MRN: [id number]  HPI The PT is here for annual exam and re-evaluation of chronic medical conditions, medication management and review of any available recent lab and radiology data.  Preventive health is updated, specifically  Cancer screening and Immunization.   Questions or concerns regarding consultations or procedures which the PT has had in the interim are  addressed. The PT denies any adverse reactions to current medications since the last visit.  There are no new concerns.  There are no specific complaints  She denies polyuria, polydipsia, blurred vision or hypoglycemic episodes. Fasting blood sugars are seldom over 120      Review of Systems See HPI Denies recent fever or chills. Denies sinus pressure, nasal congestion, ear pain or sore throat. Denies chest congestion, productive cough or wheezing. Denies chest pains, palpitations and leg swelling Still c/o abdominal pain,but this is being investigated by gI   Denies dysuria, frequency, hesitancy or incontinence. Denies joint pain, swelling and limitation in mobility. Denies headaches, seizures, numbness, or tingling. Denies depression, anxiety or insomnia. Denies skin break down or rash.        Objective:   Physical Exam Pleasant well nourished female, alert and oriented x 3, in no cardio-pulmonary distress. Afebrile. HEENT No facial trauma or asymetry. Sinuses non tender.  EOMI, PERTL, fundoscopic exam is normal, no hemorhage or exudate.  External ears normal, tympanic membranes clear. Oropharynx moist, no exudate, fair  dentition. Neck: supple, no adenopathy,JVD or thyromegaly.No bruits.  Chest: Clear to ascultation bilaterally.No crackles or wheezes. Non tender to palpation  Breast: No asymetry,no masses. No nipple discharge or inversion. No axillary or supraclavicular adenopathy  Cardiovascular system; Heart sounds  normal,  S1 and  S2 ,no S3.  No murmur, or thrill. Apical beat not displaced Peripheral pulses normal.  Abdomen: Soft, non tender, no organomegaly or masses. No bruits. Bowel sounds normal. No guarding, tenderness or rebound.  Rectal:  No mass. Guaiac negative stool.  GU: External genitalia normal. No lesions. Vaginal canal normal.No discharge. Uterus normal size, no adnexal masses, no cervical motion or adnexal tenderness.  Musculoskeletal exam: Full ROM of spine, hips , shoulders and knees. No deformity ,swelling or crepitus noted. No muscle wasting or atrophy.   Neurologic: Cranial nerves 2 to 12 intact. Power, tone ,sensation and reflexes normal throughout. No disturbance in gait. No tremor.  Skin: Intact, no ulceration, erythema , scaling or rash noted. Pigmentation normal throughout  Psych; Normal mood and affect. Judgement and concentration normal Diabetic Foot Check:  Appearance - no lesions, ulcers or calluses Skin - no unusual pallor or redness Sensation - grossly intact to light touch Monofilament testing -  Right - Great toe, medial, central, lateral ball and posterior foot decreased Left - Great toe, medial, central, lateral ball and posterior foot decreased Pulses Left - Dorsalis Pedis and Posterior Tibia normal Right - Dorsalis Pedis and Posterior Tibia normal        Assessment & Plan:

## 2011-09-19 ENCOUNTER — Ambulatory Visit (HOSPITAL_COMMUNITY)
Admission: RE | Admit: 2011-09-19 | Discharge: 2011-09-19 | Disposition: A | Discharge status: Home / Self Care | Payer: Medicare HMO | Source: Ambulatory Visit | Attending: Internal Medicine | Admitting: Internal Medicine

## 2011-09-19 DIAGNOSIS — K5730 Diverticulosis of large intestine without perforation or abscess without bleeding: Secondary | ICD-10-CM | POA: Insufficient documentation

## 2011-09-19 DIAGNOSIS — K314 Gastric diverticulum: Secondary | ICD-10-CM | POA: Insufficient documentation

## 2011-09-19 DIAGNOSIS — R1032 Left lower quadrant pain: Secondary | ICD-10-CM | POA: Insufficient documentation

## 2011-09-19 DIAGNOSIS — R1011 Right upper quadrant pain: Secondary | ICD-10-CM | POA: Insufficient documentation

## 2011-09-19 DIAGNOSIS — R197 Diarrhea, unspecified: Secondary | ICD-10-CM | POA: Insufficient documentation

## 2011-09-24 NOTE — Assessment & Plan Note (Signed)
Controlled, no change in medication  

## 2011-10-05 NOTE — Telephone Encounter (Signed)
This encounter was created in error - please disregard.

## 2011-11-13 ENCOUNTER — Encounter (INDEPENDENT_AMBULATORY_CARE_PROVIDER_SITE_OTHER): Payer: Self-pay | Admitting: Internal Medicine

## 2011-11-13 ENCOUNTER — Ambulatory Visit (INDEPENDENT_AMBULATORY_CARE_PROVIDER_SITE_OTHER): Payer: Medicare HMO | Admitting: Internal Medicine

## 2011-11-13 DIAGNOSIS — R109 Unspecified abdominal pain: Secondary | ICD-10-CM

## 2011-11-13 MED ORDER — LIDOCAINE 5 % EX PTCH: 1.0000 | MEDICATED_PATCH | CUTANEOUS | Status: AC

## 2011-11-13 NOTE — Patient Instructions (Signed)
Lidodern patch to rt and left upper quadrant. F/u 3 months with Dr. Karilyn Cota,.

## 2011-11-13 NOTE — Progress Notes (Signed)
Subjective:     Patient ID: [name] [name], female   DOB: 1955-11-05, 56 y.o.   MRN: [id number]  HPI She presents today with c/o left and rt upper quadrant. She tells me she has had this pain every since she had her cholecystomy of 2012.   She tells me it radiates across her upper abdomen.  She says the pain comes and goes. She says sometimes the pain will radiate down into her rt leg.   The occurs usually on movement. Pain does not occur every day.  Describes as a stabbing pain when it occurs and then resolves.  It is not related to food intake.  Her appetite is good. No weight loss. She usually has a BM x 2 a day.  No melena or bright red rectal bleeding.   08/11/2011 EGD/ED for dysphagia: Impression:  Esophageal web disrupted by passing 56 [place] dilator.  Small sliding-type hernia  Subdental finding of small diverticulum at gastric fundus also noted on upper GI series.      09/26/2011 CT abdomen and Pelvis: IMPRESSION:  Tiny gastric diverticulum.  Metallic foreign body right lower quadrant, uncertain etiology.  Minimal sigmoid diverticulosis without evidence of diverticulitis.  Question left ventricular dilatation.  No acute intra abdominal or intrapelvic process.   Review of Systems     Current Outpatient Prescriptions  Medication Sig Dispense Refill  . amLODipine (NORVASC) 10 MG tablet Take 1 tablet (10 mg total) by mouth daily.  90 tablet  1  . aspirin 325 MG tablet Take 325 mg by mouth daily.        Marland Kitchen Cod Liver Oil w/Vit A & D CAPS Take 1 capsule by mouth daily.        . CRESTOR 10 MG tablet TAKE 1 TABLET DAILY AT BEDTIME  90 each  1  . fluticasone (FLONASE) 50 MCG/ACT nasal spray Place 2 sprays into the nose daily.  16 g  2  . furosemide (LASIX) 40 MG tablet Take 40 mg by mouth daily as needed. FOR FLUID RETENTION       . HYDROcodone-acetaminophen (VICODIN) 5-500 MG per tablet Take 1 tablet by mouth every 4 (four) hours as needed.        Marland Kitchen losartan (COZAAR) 50 MG  tablet TAKE 2 TABLETS DAILY  180 tablet  1  . metFORMIN (GLUCOPHAGE) 500 MG tablet Take 500 mg by mouth daily. Dose increase effective 02/13/2011, pls do not send the 1000mg  tablet      . Multiple Vitamins-Minerals (MULTI FOR HER PO) Take 1 tablet by mouth daily.        . nabumetone (RELAFEN) 750 MG tablet Take 750 mg by mouth 2 (two) times daily.        Marland Kitchen omeprazole (PRILOSEC) 20 MG capsule Take 20 mg by mouth daily.       . polyethylene glycol powder (GLYCOLAX/MIRALAX) powder MIX 1 CAPFUL IN 8 OUNCES OF WATER EVERY DAY  527 g  3  . spironolactone (ALDACTONE) 25 MG tablet Take 1 tablet (25 mg total) by mouth daily.  90 tablet  1  . zolpidem (AMBIEN) 10 MG tablet Take 1 tablet (10 mg total) by mouth at bedtime as needed. For insomnia    90 tablet  1   Current Facility-Administered Medications  Medication Dose Route Frequency Provider Last Rate Last Dose  . Influenza (>/= 3 years) inactive virus vaccine (FLVIRIN/FLUZONE) injection SUSP 0.5 mL  0.5 mL Intramuscular Once Syliva Overman, MD  Past Medical History  Diagnosis Date  . Anxiety   . Depression   . GERD (gastroesophageal reflux disease)   . Mixed hyperlipidemia   . Essential hypertension, benign   . Low back pain   . Osteoarthritis   . Constipation   . Varicose veins   . Positive H. pylori test   . Glaucoma     Last eye exam 9/08  . Hiatal hernia   . Schatzki's ring   . Diabetes mellitus, type 2   . TIA (transient ischemic attack)     6/10  . Iron deficiency anemia    Past Surgical History  Procedure Date  . Lumbar discetomy 10/01    Dr. Fabiola Backer   . Tubal ligation 1994  . Cholecystectomy Dec 26, 2010  . Umbilical hernia repair 12/26/2010  . Spine surgery   . Colonoscopy 01/2006  . Esophagogastroduodenoscopy     Esophageal dilation 2007   Family Status  Relation Status Death Age  . Father Alive   . Mother Alive   . Sister [employer]  . Daughter Alive   . Daughter Alive    Allergies  Allergen  Reactions  . Contrast Media (Iodinated Diagnostic Agents) Hives  . Lisinopril     REACTION: cough  . Buspar (Buspirone Hcl) Palpitations    Objective:   Physical Exam  Filed Vitals:   11/13/11 1033  Height: 5\' 5"  (1.651 m)  Weight: 240 lb 4.8 oz (108.999 kg)    Alert and oriented. Skin warm and dry. Oral mucosa is moist.   . Sclera anicteric, conjunctivae is pink. Thyroid not enlarged. No cervical lymphadenopathy. Lungs clear. Heart regular rate and rhythm.  Abdomen is soft. Bowel sounds are positive. No hepatomegaly. No abdominal masses felt. Slight tenderness across upper abdomen.   No edema to lower extremities. Patient is alert and oriented.      Assessment:    Rt and left upper quadrant pain which comes and goes. This sounds more like MS pain. She does not have any alarm symptoms.     Plan:   . Will let her try Lidodern patch and see how she does. OV in 3 months with Dr. Karilyn Cota.

## 2011-12-06 ENCOUNTER — Ambulatory Visit (HOSPITAL_COMMUNITY)
Admission: RE | Admit: 2011-12-06 | Discharge: 2011-12-06 | Disposition: A | Discharge status: Home / Self Care | Payer: Medicare HMO | Source: Ambulatory Visit | Attending: Family Medicine | Admitting: Family Medicine

## 2011-12-06 ENCOUNTER — Ambulatory Visit (INDEPENDENT_AMBULATORY_CARE_PROVIDER_SITE_OTHER): Payer: Medicare HMO | Admitting: Family Medicine

## 2011-12-06 ENCOUNTER — Encounter: Payer: Self-pay | Admitting: Family Medicine

## 2011-12-06 VITALS — BP 138/76 | HR 90 | Resp 18 | Ht 65.0 in | Wt 243.1 lb

## 2011-12-06 DIAGNOSIS — I10 Essential (primary) hypertension: Secondary | ICD-10-CM

## 2011-12-06 DIAGNOSIS — J019 Acute sinusitis, unspecified: Secondary | ICD-10-CM | POA: Insufficient documentation

## 2011-12-06 DIAGNOSIS — E119 Type 2 diabetes mellitus without complications: Secondary | ICD-10-CM

## 2011-12-06 DIAGNOSIS — R109 Unspecified abdominal pain: Secondary | ICD-10-CM | POA: Insufficient documentation

## 2011-12-06 DIAGNOSIS — E785 Hyperlipidemia, unspecified: Secondary | ICD-10-CM

## 2011-12-06 MED ORDER — PENICILLIN V POTASSIUM 500 MG PO TABS: 500.0000 mg | ORAL_TABLET | Freq: Three times a day (TID) | ORAL | Status: AC

## 2011-12-06 MED ORDER — PRAVASTATIN SODIUM 40 MG PO TABS: 40.0000 mg | ORAL_TABLET | Freq: Every evening | ORAL | Status: DC

## 2011-12-06 NOTE — Patient Instructions (Signed)
F/u in 5 weeks   You are being treated for sinusitis.  Change in cholesterol  Medicine to pravastatin.  You will be sent for abdominal xray to look again at your abdominal pain.I will contact you again after this is done. I have spoken with DrJenkins and will speak with Dr Karilyn Cota also  HBa1C and chem 7 today

## 2011-12-06 NOTE — Progress Notes (Signed)
  Subjective:    Patient ID: [name] [name], female    DOB: 1955-10-18, 56 y.o.   MRN: [id number]  HPI 2 week h/o stuffy head and congestion, cream nasal discharge and intermittent chills. Denies chest congestion, cough or sputum, has had no fever. Pt continues to c/o uncontrolled abdominal pain , which has been present ever since she had lap cholecystectomy in 2012. Requesting to see Doc in [place], has been followed by local GI doc . No weight loss, nausea or change in bowel movements   Review of Systems See HPI Denies chest pains, palpitations and leg swelling  Denies dysuria, frequency, hesitancy or incontinence. Denies joint pain, swelling and limitation in mobility. Denies headaches, seizures, numbness, or tingling. Denies depression, anxiety or insomnia. Denies skin break down or rash.        Objective:   Physical Exam Patient alert and oriented and in no cardiopulmonary distress.  HEENT: No facial asymmetry, EOMI, frontal sinus tenderness,  oropharynx pink and moist.  Neck supple no adenopathy.  Chest: Clear to auscultation bilaterally.  CVS: S1, S2 no murmurs, no S3.  ABD: Soft superficial tenderness in RLQ, no palpable organomegaly or mass Bowel sounds normal.  Ext: No edema  MS: Adequate ROM spine, shoulders, hips and knees.  Skin: Intact, no ulcerations or rash noted.  Psych: Good eye contact, normal affect. Memory intact  Anxious, not  depressed appearing.  CNS: CN 2-12 intact, power, tone and sensation normal throughout.        Assessment & Plan:

## 2011-12-07 LAB — HEMOGLOBIN A1C: Mean Plasma Glucose: 137 mg/dL — ABNORMAL HIGH (ref <117)

## 2011-12-07 LAB — BASIC METABOLIC PANEL
CO2: 28 mEq/L (ref 19–32)
Calcium: 9.5 mg/dL (ref 8.4–10.5)
Chloride: 100 mEq/L (ref 96–112)
Glucose, Bld: 90 mg/dL (ref 70–99)
Potassium: 4.4 mEq/L (ref 3.5–5.3)
Sodium: 139 mEq/L (ref 135–145)

## 2011-12-22 NOTE — Assessment & Plan Note (Signed)
Controlled, no change in medication  

## 2011-12-22 NOTE — Assessment & Plan Note (Signed)
Acute symptoms , antibiotics prescribed

## 2011-12-22 NOTE — Assessment & Plan Note (Signed)
reports intolerance to medication, will change to pravastatin

## 2011-12-22 NOTE — Assessment & Plan Note (Signed)
C/o chronic abdominal pain s/p cholecystectomy in 2012 Pt to have abdominal xray. Case discussed both with the surgeon and and the GI specialist who has been following her , further f/u will be throgh one  Or the other or both as they see necessary. Of note , both Docs state the "foreign body" reported on last scan is very small, no perceived explanation for pain, and cholecystectomy was done laparoscopically

## 2012-01-01 ENCOUNTER — Encounter (INDEPENDENT_AMBULATORY_CARE_PROVIDER_SITE_OTHER): Payer: Self-pay | Admitting: Internal Medicine

## 2012-01-01 ENCOUNTER — Ambulatory Visit (INDEPENDENT_AMBULATORY_CARE_PROVIDER_SITE_OTHER): Payer: Medicare HMO | Admitting: Internal Medicine

## 2012-01-01 VITALS — BP 118/74 | HR 78 | Temp 97.1°F | Resp 18 | Ht 65.0 in | Wt 243.3 lb

## 2012-01-01 DIAGNOSIS — M2550 Pain in unspecified joint: Secondary | ICD-10-CM

## 2012-01-01 DIAGNOSIS — R1032 Left lower quadrant pain: Secondary | ICD-10-CM

## 2012-01-01 MED ORDER — HYOSCYAMINE SULFATE 0.125 MG SL SUBL: 0.1250 mg | SUBLINGUAL_TABLET | Freq: Three times a day (TID) | SUBLINGUAL | Status: AC | PRN

## 2012-01-01 MED ORDER — NABUMETONE 750 MG PO TABS: 750.0000 mg | ORAL_TABLET | Freq: Every day | ORAL | Status: DC

## 2012-01-01 NOTE — Patient Instructions (Addendum)
Please note dose of Nabumetone  decreased to once a day with food; dose is 750 mg. Hyoscyamine 0.125 mg sublingual 1 tablet up to 3 times a day for abdominal pain as needed. Call office with progress report in 2 weeks. Can decrease polyethylene glycol dose to half or three fourths of a scoop daily.

## 2012-01-08 NOTE — Progress Notes (Signed)
Presenting complaint;  Followup for right upper quadrant abdominal pain. Subjective:  Patient is 56 year old Afro-American female who is here for reevaluation of her abdominal pain. She had cholecystectomy in March last year but did not get any relief. She had EGD the ED in October 2012 vitamin. Her dysphagia has resolved. She also had abdominopelvic CT in December 2012. No acute intra-abdominal process is noted. She had mild sigmoid diverticulosis. She had metallic foreign body in her right low quadrant and a subsequent abdominal film this was gone implying that was in her bowel. She did not get any relief with dicyclomine. Lidoderm was recommended but was not covered. She continues to complain of pain primarily in her right upper quadrant. This pain does not occur every day. When it does occur it is associated with nausea and weakness and she feels like she will pass out. This pain is not triggered with meals or any other activity. She denies vomiting or fever. She also denies dysuria or hematuria. She states most of her stools are mushy or loose she is not having normal bowel movements. Her appetite is normal and her weight has remained the same and last year. Current Medications: Current Outpatient Prescriptions  Medication Sig Dispense Refill  . amLODipine (NORVASC) 10 MG tablet Take 1 tablet (10 mg total) by mouth daily.  90 tablet  1  . aspirin 325 MG tablet Take 325 mg by mouth daily.        Marland Kitchen Cod Liver Oil w/Vit A & D CAPS Take 1 capsule by mouth daily.        . furosemide (LASIX) 40 MG tablet Take 40 mg by mouth daily as needed. FOR FLUID RETENTION       . HYDROcodone-acetaminophen (VICODIN) 5-500 MG per tablet Take 1 tablet by mouth every 4 (four) hours as needed.        Marland Kitchen losartan (COZAAR) 50 MG tablet TAKE 2 TABLETS DAILY  180 tablet  1  . metFORMIN (GLUCOPHAGE) 500 MG tablet Take 500 mg by mouth daily. Dose increase effective 02/13/2011, pls do not send the 1000mg  tablet      .  Multiple Vitamins-Minerals (MULTI FOR HER PO) Take 1 tablet by mouth daily.        . nabumetone (RELAFEN) 750 MG tablet Take 1 tablet (750 mg total) by mouth daily. Per Patient as needed  30 tablet  2  . omeprazole (PRILOSEC) 20 MG capsule Take 20 mg by mouth daily.       . polyethylene glycol powder (GLYCOLAX/MIRALAX) powder MIX 1 CAPFUL IN 8 OUNCES OF WATER EVERY DAY  527 g  3  . pravastatin (PRAVACHOL) 40 MG tablet Take 1 tablet (40 mg total) by mouth every evening.  30 tablet  11  . spironolactone (ALDACTONE) 25 MG tablet Take 1 tablet (25 mg total) by mouth daily.  90 tablet  1  . zolpidem (AMBIEN) 10 MG tablet Take 1 tablet (10 mg total) by mouth at bedtime as needed. For insomnia    90 tablet  1  . fluticasone (FLONASE) 50 MCG/ACT nasal spray Place 2 sprays into the nose daily.  16 g  2  . hyoscyamine (LEVSIN/SL) 0.125 MG SL tablet Place 1 tablet (0.125 mg total) under the tongue 3 (three) times daily as needed for cramping.  60 tablet  1   Current Facility-Administered Medications  Medication Dose Route Frequency Provider Last Rate Last Dose  . Influenza (>/= 3 years) inactive virus vaccine (FLVIRIN/FLUZONE) injection SUSP 0.5 mL  0.5 mL Intramuscular Once Kerri Perches, MD         Objective: Blood pressure 118/74, pulse 78, temperature 97.1 F (36.2 C), temperature source Oral, resp. rate 18, height 5\' 5"  (1.651 m), weight 243 lb 4.8 oz (110.36 kg). Conjunctiva is pink. Sclera is nonicteric Oropharyngeal mucosa is normal. No neck masses or thyromegaly noted. Cardiac exam with regular rhythm normal S1 and S2. No murmur or gallop noted. Lungs are clear to auscultation. Abdomen is full. Bowel sounds are normal. Palpation reveals soft abdomen with mild vague tenderness below the right costal margin. No organomegaly or masses noted.  No LE edema or clubbing noted.  Labs/studies Results: Plain abdominal film from 12/06/2011 reviewed with the patient along with prior CT of  09/26/2011   Assessment:  #1. Right upper quadrant abdominal pain. I still believe this is a musculoskeletal pain and she may be experiencing some vasovagal phenomenon. Features not typical of irritable bowel syndrome. Symptoms may be related to NSAID therapy. #2. Loose stools secondary to MiraLax. She needs to adjust dose downwards.   Plan:  Patient reassured. Decrease Nabumetone to 750 mg once a day. Levsin sublingual 3 times a day when necessary. Decrease polyethylene glycol dose to half to three fourths of a scoop daily.. Call  office with progress report in 2 weeks

## 2012-01-17 ENCOUNTER — Ambulatory Visit (INDEPENDENT_AMBULATORY_CARE_PROVIDER_SITE_OTHER): Payer: Medicare HMO | Admitting: Family Medicine

## 2012-01-17 ENCOUNTER — Encounter: Payer: Self-pay | Admitting: Family Medicine

## 2012-01-17 VITALS — BP 124/78 | HR 75 | Resp 15 | Ht 65.0 in | Wt 244.4 lb

## 2012-01-17 DIAGNOSIS — E119 Type 2 diabetes mellitus without complications: Secondary | ICD-10-CM

## 2012-01-17 DIAGNOSIS — R5381 Other malaise: Secondary | ICD-10-CM

## 2012-01-17 DIAGNOSIS — F411 Generalized anxiety disorder: Secondary | ICD-10-CM

## 2012-01-17 DIAGNOSIS — R5383 Other fatigue: Secondary | ICD-10-CM

## 2012-01-17 DIAGNOSIS — E785 Hyperlipidemia, unspecified: Secondary | ICD-10-CM

## 2012-01-17 DIAGNOSIS — I10 Essential (primary) hypertension: Secondary | ICD-10-CM

## 2012-01-17 LAB — BASIC METABOLIC PANEL
BUN: 11 mg/dL (ref 6–23)
Chloride: 103 mEq/L (ref 96–112)
Creat: 0.86 mg/dL (ref 0.50–1.10)

## 2012-01-17 LAB — MICROALBUMIN / CREATININE URINE RATIO: Creatinine, Urine: 126.6 mg/dL

## 2012-01-17 LAB — HEMOGLOBIN A1C: Hgb A1c MFr Bld: 6.4 % — ABNORMAL HIGH (ref <5.7)

## 2012-01-17 MED ORDER — PRAVASTATIN SODIUM 40 MG PO TABS: 40.0000 mg | ORAL_TABLET | Freq: Every evening | ORAL | Status: DC

## 2012-01-17 NOTE — Patient Instructions (Signed)
F/u in 4.5 month, august 16 or after  It is important that you exercise regularly at least 30 minutes 5 times a week. If you develop chest pain, have severe difficulty breathing, or feel very tired, stop exercising immediately and seek medical attention    A healthy diet is rich in fruit, vegetables and whole grains. Poultry fish, nuts and beans are a healthy choice for protein rather then red meat. A low sodium diet and drinking 64 ounces of water daily is generally recommended. Oils and sweet should be limited. Carbohydrates especially for those who are diabetic or overweight, should be limited to 30-45 gram per meal. It is important to eat on a regular schedule, at least 3 times daily. Snacks should be primarily fruits, vegetables or nuts.  CBC, fasting lipid, cmp and eGFR, hBA1C, tSH , vit d august 12 or after  No changes in medication  Continue therapy with dr Hilda Lias and management of abdominal pain with dr Karilyn Cota.  Weight loss will help you to feel better.  Goal is 2 to 3 pounds per month

## 2012-01-17 NOTE — Progress Notes (Signed)
  Subjective:    Patient ID: [name] [name], female    DOB: 07/14/1956, 56 y.o.   MRN: [id number]  HPI The PT is here for follow up and re-evaluation of chronic medical conditions, medication management and review of any available recent lab and radiology data.  Preventive health is updated, specifically  Cancer screening and Immunization.   Questions or concerns regarding consultations or procedures which the PT has had in the interim are  Addressed.Pt continues to c/o vague lower abdominal pain, now she is wondering if this could be from problems in her spine. She is following up with GI and states she was recently started on miralax/equivalent. She is seeing orthopedics regarding left shoulder and upper extremity pain, is in therapy and on pain meds, not particularly happy with the progress at this point , I advise her to give things time to improve, and call if symptoms do not on completion of therapy The PT denies any adverse reactions to current medications since the last visit.  Fasting sugars are tested and are seldom over 120. [name] [name] is attempting dietary modification to assist with weight loss , no regular exercise, she has actually gained weight      Review of Systems See HPI Denies recent fever or chills. Denies sinus pressure, nasal congestion, ear pain or sore throat. Denies chest congestion, productive cough or wheezing. Denies chest pains, palpitations and leg swelling Denies  nausea, vomiting,diarrhea or constipation.   Denies dysuria, frequency, hesitancy or incontinence.  Denies headaches, seizures, numbness, or tingling. Chronic anxiety Denies skin break down or rash.        Objective:   Physical Exam  Patient alert and oriented and in no cardiopulmonary distress.  HEENT: No facial asymmetry, EOMI, no sinus tenderness,  oropharynx pink and moist.  Neck supple no adenopathy.  Chest: Clear to auscultation bilaterally.  CVS: S1, S2 no murmurs, no S3.  ABD:  Soft non tender. Bowel sounds normal.  Ext: No edema  MS: Adequate ROM spine, shoulders, hips and knees.  Skin: Intact, no ulcerations or rash noted.  Psych: Good eye contact, normal affect. Memory intact  anxious but not depressed appearing.  CNS: CN 2-12 intact, power, tone and sensation normal throughout.       Assessment & Plan:

## 2012-01-19 ENCOUNTER — Telehealth (INDEPENDENT_AMBULATORY_CARE_PROVIDER_SITE_OTHER): Payer: Self-pay | Admitting: *Deleted

## 2012-01-19 NOTE — Assessment & Plan Note (Signed)
Controlled, no change in medication  

## 2012-01-19 NOTE — Assessment & Plan Note (Signed)
Deteriorated. Patient re-educated about  the importance of commitment to a  minimum of 150 minutes of exercise per week. The importance of healthy food choices with portion control discussed. Encouraged to start a food diary, count calories and to consider  joining a support group. Sample diet sheets offered. Goals set by the patient for the next several months.    

## 2012-01-19 NOTE — Telephone Encounter (Signed)
Faylynn call and said she will continue using the powder. The powder is helping a little.

## 2012-01-19 NOTE — Assessment & Plan Note (Signed)
Chronic anxiety unchanged, no medication indicated

## 2012-01-22 ENCOUNTER — Emergency Department (HOSPITAL_COMMUNITY): Payer: Medicare HMO

## 2012-01-22 ENCOUNTER — Encounter (HOSPITAL_COMMUNITY): Payer: Self-pay | Admitting: *Deleted

## 2012-01-22 ENCOUNTER — Inpatient Hospital Stay (HOSPITAL_COMMUNITY)
Admission: EM | Admit: 2012-01-22 | Discharge: 2012-01-23 | DRG: 091 | Disposition: A | Discharge status: Home / Self Care | Payer: Medicare HMO | Attending: Internal Medicine | Admitting: Internal Medicine

## 2012-01-22 DIAGNOSIS — F329 Major depressive disorder, single episode, unspecified: Secondary | ICD-10-CM | POA: Diagnosis present

## 2012-01-22 DIAGNOSIS — I8390 Asymptomatic varicose veins of unspecified lower extremity: Secondary | ICD-10-CM | POA: Diagnosis present

## 2012-01-22 DIAGNOSIS — E669 Obesity, unspecified: Secondary | ICD-10-CM

## 2012-01-22 DIAGNOSIS — E1149 Type 2 diabetes mellitus with other diabetic neurological complication: Secondary | ICD-10-CM | POA: Diagnosis present

## 2012-01-22 DIAGNOSIS — R1319 Other dysphagia: Secondary | ICD-10-CM

## 2012-01-22 DIAGNOSIS — L259 Unspecified contact dermatitis, unspecified cause: Secondary | ICD-10-CM

## 2012-01-22 DIAGNOSIS — R5383 Other fatigue: Secondary | ICD-10-CM

## 2012-01-22 DIAGNOSIS — E1142 Type 2 diabetes mellitus with diabetic polyneuropathy: Secondary | ICD-10-CM | POA: Diagnosis present

## 2012-01-22 DIAGNOSIS — M545 Low back pain, unspecified: Secondary | ICD-10-CM

## 2012-01-22 DIAGNOSIS — K5909 Other constipation: Secondary | ICD-10-CM

## 2012-01-22 DIAGNOSIS — F3289 Other specified depressive episodes: Secondary | ICD-10-CM | POA: Diagnosis present

## 2012-01-22 DIAGNOSIS — Z794 Long term (current) use of insulin: Secondary | ICD-10-CM

## 2012-01-22 DIAGNOSIS — R200 Anesthesia of skin: Secondary | ICD-10-CM | POA: Diagnosis present

## 2012-01-22 DIAGNOSIS — Q393 Congenital stenosis and stricture of esophagus: Secondary | ICD-10-CM

## 2012-01-22 DIAGNOSIS — B353 Tinea pedis: Secondary | ICD-10-CM

## 2012-01-22 DIAGNOSIS — R5381 Other malaise: Secondary | ICD-10-CM

## 2012-01-22 DIAGNOSIS — M1990 Unspecified osteoarthritis, unspecified site: Secondary | ICD-10-CM

## 2012-01-22 DIAGNOSIS — J309 Allergic rhinitis, unspecified: Secondary | ICD-10-CM | POA: Diagnosis present

## 2012-01-22 DIAGNOSIS — E860 Dehydration: Secondary | ICD-10-CM | POA: Diagnosis present

## 2012-01-22 DIAGNOSIS — I639 Cerebral infarction, unspecified: Secondary | ICD-10-CM

## 2012-01-22 DIAGNOSIS — F458 Other somatoform disorders: Secondary | ICD-10-CM | POA: Diagnosis present

## 2012-01-22 DIAGNOSIS — E1169 Type 2 diabetes mellitus with other specified complication: Secondary | ICD-10-CM | POA: Diagnosis present

## 2012-01-22 DIAGNOSIS — K449 Diaphragmatic hernia without obstruction or gangrene: Secondary | ICD-10-CM | POA: Diagnosis present

## 2012-01-22 DIAGNOSIS — Z8673 Personal history of transient ischemic attack (TIA), and cerebral infarction without residual deficits: Secondary | ICD-10-CM

## 2012-01-22 DIAGNOSIS — M25519 Pain in unspecified shoulder: Secondary | ICD-10-CM

## 2012-01-22 DIAGNOSIS — R7309 Other abnormal glucose: Secondary | ICD-10-CM

## 2012-01-22 DIAGNOSIS — R0989 Other specified symptoms and signs involving the circulatory and respiratory systems: Secondary | ICD-10-CM

## 2012-01-22 DIAGNOSIS — M2550 Pain in unspecified joint: Secondary | ICD-10-CM

## 2012-01-22 DIAGNOSIS — G459 Transient cerebral ischemic attack, unspecified: Secondary | ICD-10-CM

## 2012-01-22 DIAGNOSIS — R202 Paresthesia of skin: Secondary | ICD-10-CM | POA: Diagnosis present

## 2012-01-22 DIAGNOSIS — E118 Type 2 diabetes mellitus with unspecified complications: Secondary | ICD-10-CM | POA: Diagnosis present

## 2012-01-22 DIAGNOSIS — K429 Umbilical hernia without obstruction or gangrene: Secondary | ICD-10-CM

## 2012-01-22 DIAGNOSIS — E119 Type 2 diabetes mellitus without complications: Secondary | ICD-10-CM

## 2012-01-22 DIAGNOSIS — D509 Iron deficiency anemia, unspecified: Secondary | ICD-10-CM

## 2012-01-22 DIAGNOSIS — Q391 Atresia of esophagus with tracheo-esophageal fistula: Secondary | ICD-10-CM

## 2012-01-22 DIAGNOSIS — G8929 Other chronic pain: Secondary | ICD-10-CM | POA: Diagnosis present

## 2012-01-22 DIAGNOSIS — K219 Gastro-esophageal reflux disease without esophagitis: Secondary | ICD-10-CM

## 2012-01-22 DIAGNOSIS — I10 Essential (primary) hypertension: Secondary | ICD-10-CM

## 2012-01-22 DIAGNOSIS — H409 Unspecified glaucoma: Secondary | ICD-10-CM

## 2012-01-22 DIAGNOSIS — R002 Palpitations: Secondary | ICD-10-CM

## 2012-01-22 DIAGNOSIS — Z79899 Other long term (current) drug therapy: Secondary | ICD-10-CM

## 2012-01-22 DIAGNOSIS — F411 Generalized anxiety disorder: Secondary | ICD-10-CM

## 2012-01-22 DIAGNOSIS — R079 Chest pain, unspecified: Secondary | ICD-10-CM

## 2012-01-22 DIAGNOSIS — G4700 Insomnia, unspecified: Secondary | ICD-10-CM

## 2012-01-22 DIAGNOSIS — R109 Unspecified abdominal pain: Secondary | ICD-10-CM

## 2012-01-22 DIAGNOSIS — Z6839 Body mass index (BMI) 39.0-39.9, adult: Secondary | ICD-10-CM

## 2012-01-22 DIAGNOSIS — M79602 Pain in left arm: Secondary | ICD-10-CM

## 2012-01-22 DIAGNOSIS — R209 Unspecified disturbances of skin sensation: Principal | ICD-10-CM | POA: Diagnosis present

## 2012-01-22 DIAGNOSIS — E785 Hyperlipidemia, unspecified: Secondary | ICD-10-CM | POA: Diagnosis present

## 2012-01-22 DIAGNOSIS — E782 Mixed hyperlipidemia: Secondary | ICD-10-CM | POA: Diagnosis present

## 2012-01-22 DIAGNOSIS — Z91041 Radiographic dye allergy status: Secondary | ICD-10-CM

## 2012-01-22 DIAGNOSIS — R1011 Right upper quadrant pain: Secondary | ICD-10-CM

## 2012-01-22 HISTORY — DX: Unspecified abdominal pain: R10.9

## 2012-01-22 HISTORY — DX: Other chronic pain: G89.29

## 2012-01-22 LAB — URINE MICROSCOPIC-ADD ON

## 2012-01-22 LAB — URINALYSIS, ROUTINE W REFLEX MICROSCOPIC
Glucose, UA: NEGATIVE mg/dL
Specific Gravity, Urine: <1.005 — ABNORMAL LOW (ref 1.005–1.030)
Urobilinogen, UA: 0.2 mg/dL (ref 0.0–1.0)
pH: 5.5 (ref 5.0–8.0)

## 2012-01-22 LAB — CBC
HCT: 39.1 % (ref 36.0–46.0)
Hemoglobin: 12.1 g/dL (ref 12.0–15.0)
RBC: 4.64 MIL/uL (ref 3.87–5.11)
RDW: 15.5 % (ref 11.5–15.5)
WBC: 8.9 10*3/uL (ref 4.0–10.5)

## 2012-01-22 LAB — COMPREHENSIVE METABOLIC PANEL
BUN: 11 mg/dL (ref 6–23)
Calcium: 10.6 mg/dL — ABNORMAL HIGH (ref 8.4–10.5)
Creatinine, Ser: 0.88 mg/dL (ref 0.50–1.10)
GFR calc Af Amer: 84 mL/min — ABNORMAL LOW (ref >90)
Glucose, Bld: 94 mg/dL (ref 70–99)
Total Protein: 9.3 g/dL — ABNORMAL HIGH (ref 6.0–8.3)

## 2012-01-22 LAB — PROTIME-INR
INR: 0.97 (ref 0.00–1.49)
Prothrombin Time: 13.1 seconds (ref 11.6–15.2)

## 2012-01-22 MED ORDER — INSULIN ASPART 100 UNIT/ML ~~LOC~~ SOLN: 0.0000 [IU] | Freq: Three times a day (TID) | SUBCUTANEOUS | Status: DC

## 2012-01-22 MED ORDER — ASPIRIN 325 MG PO TABS
325.0000 mg | ORAL_TABLET | Freq: Every day | ORAL | Status: DC
  Administered 2012-01-23: 325 mg via ORAL
  Filled 2012-01-22: qty 1

## 2012-01-22 MED ORDER — SODIUM CHLORIDE 0.9 % IV SOLN: INTRAVENOUS | Status: DC

## 2012-01-22 MED ORDER — AMLODIPINE BESYLATE 5 MG PO TABS
10.0000 mg | ORAL_TABLET | Freq: Every day | ORAL | Status: DC
  Administered 2012-01-23: 10 mg via ORAL
  Filled 2012-01-22: qty 2

## 2012-01-22 MED ORDER — FLEET ENEMA 7-19 GM/118ML RE ENEM: 1.0000 | ENEMA | Freq: Every day | RECTAL | Status: DC | PRN

## 2012-01-22 MED ORDER — SPIRONOLACTONE 25 MG PO TABS
25.0000 mg | ORAL_TABLET | Freq: Every day | ORAL | Status: DC
  Administered 2012-01-23: 25 mg via ORAL
  Filled 2012-01-22 (×3): qty 1

## 2012-01-22 MED ORDER — ZOLPIDEM TARTRATE 5 MG PO TABS
10.0000 mg | ORAL_TABLET | Freq: Every evening | ORAL | Status: DC | PRN
  Administered 2012-01-23: 10 mg via ORAL
  Filled 2012-01-22: qty 2

## 2012-01-22 MED ORDER — ENOXAPARIN SODIUM 40 MG/0.4ML ~~LOC~~ SOLN
40.0000 mg | SUBCUTANEOUS | Status: DC
  Administered 2012-01-23: 40 mg via SUBCUTANEOUS
  Filled 2012-01-22: qty 0.4

## 2012-01-22 MED ORDER — METFORMIN HCL 500 MG PO TABS
500.0000 mg | ORAL_TABLET | Freq: Every day | ORAL | Status: DC
  Administered 2012-01-23: 500 mg via ORAL
  Filled 2012-01-22: qty 1

## 2012-01-22 MED ORDER — FLUTICASONE PROPIONATE 50 MCG/ACT NA SUSP
2.0000 | Freq: Every day | NASAL | Status: DC
  Administered 2012-01-23: 2 via NASAL
  Filled 2012-01-22: qty 16

## 2012-01-22 MED ORDER — SIMVASTATIN 20 MG PO TABS
20.0000 mg | ORAL_TABLET | Freq: Every day | ORAL | Status: DC
  Filled 2012-01-22: qty 1

## 2012-01-22 MED ORDER — PANTOPRAZOLE SODIUM 40 MG PO TBEC
40.0000 mg | DELAYED_RELEASE_TABLET | Freq: Two times a day (BID) | ORAL | Status: DC
  Administered 2012-01-23: 40 mg via ORAL
  Filled 2012-01-22: qty 1

## 2012-01-22 MED ORDER — LOSARTAN POTASSIUM 50 MG PO TABS
50.0000 mg | ORAL_TABLET | Freq: Every day | ORAL | Status: DC
  Administered 2012-01-23: 50 mg via ORAL
  Filled 2012-01-22 (×2): qty 1

## 2012-01-22 MED ORDER — SODIUM CHLORIDE 0.9 % IV SOLN
INTRAVENOUS | Status: DC
  Administered 2012-01-23 (×2): via INTRAVENOUS
  Filled 2012-01-22 (×7): qty 1000

## 2012-01-22 MED ORDER — NABUMETONE 500 MG PO TABS
750.0000 mg | ORAL_TABLET | Freq: Every day | ORAL | Status: DC
  Administered 2012-01-23: 750 mg via ORAL
  Filled 2012-01-22 (×3): qty 1

## 2012-01-22 MED ORDER — INSULIN ASPART 100 UNIT/ML ~~LOC~~ SOLN: 0.0000 [IU] | Freq: Every day | SUBCUTANEOUS | Status: DC

## 2012-01-22 NOTE — ED Notes (Signed)
Feels she has fld in chest

## 2012-01-22 NOTE — ED Notes (Signed)
Attempted to call report and nurse unable to take report, told to call back for report

## 2012-01-22 NOTE — H&P (Signed)
PCP:   Syliva Overman, MD, MD   Chief Complaint:  Episodic numbness and tingling of the right side of her body since today  HPI: [name] [name] is an 56 y.o. female.   African American lady with multiple medical problems including anxiety and depression, diabetes and hypertension, and a past history of TIA. Reports episodes of numbness and tingling of her lower lip yesterday she eventually resolved, but since this morning has been having on and off numbness and tingling of the right side of her body from her face to her legs. She denies any frank pain, denies any speech or swallowing disorder, denies any focal weakness. Denies seizures or any other abnormal movements.  She checked her blood sugar to see if this is the possible cause of her blood sugars are within range 130 mg/dl. She had similar symptoms in 2010 and MR evaluation at that time was unremarkable.  Because of her multiple stroke risk factors the hospitalist service was called to assist with management  Rewiew of Systems:  The patient denies anorexia, fever, weight loss,, vision loss, decreased hearing, hoarseness, chest pain, syncope, dyspnea on exertion, peripheral edema, balance deficits, hemoptysis, abdominal pain, melena, hematochezia, severe indigestion/heartburn, hematuria, incontinence, genital sores, muscle weakness, suspicious skin lesions, transient blindness,  depression, unusual weight change, abnormal bleeding, enlarged lymph nodes, angioedema, and breast masses.    Past Medical History  Diagnosis Date  . Anxiety   . Depression   . GERD (gastroesophageal reflux disease)   . Mixed hyperlipidemia   . Essential hypertension, benign   . Low back pain   . Osteoarthritis   . Constipation   . Varicose veins   . Positive H. pylori test   . Glaucoma     Last eye exam 9/08  . Hiatal hernia   . Schatzki's ring   . Diabetes mellitus, type 2   . TIA (transient ischemic attack)     6/10  . Iron deficiency anemia      Past Surgical History  Procedure Date  . Lumbar discetomy 10/01    Dr. Fabiola Backer   . Tubal ligation 1994  . Cholecystectomy Dec 26, 2010  . Umbilical hernia repair 12/26/2010  . Spine surgery   . Colonoscopy 01/2006  . Esophagogastroduodenoscopy     Esophageal dilation 2007    Medications:  HOME MEDS: Prior to Admission medications   Medication Sig Start Date End Date Taking? Authorizing Provider  amLODipine (NORVASC) 10 MG tablet Take 1 tablet (10 mg total) by mouth daily. 05/15/11  Yes Kerri Perches, MD  aspirin 325 MG tablet Take 325 mg by mouth daily.     Yes Historical Provider, MD  [facility] Liver Oil w/Vit A & D CAPS Take 1 capsule by mouth daily.     Yes Historical Provider, MD  furosemide (LASIX) 40 MG tablet Take 40 mg by mouth daily as needed. FOR FLUID RETENTION  05/15/11  Yes Kerri Perches, MD  HYDROcodone-acetaminophen (VICODIN) 5-500 MG per tablet Take 1 tablet by mouth every 4 (four) hours as needed.     Yes Historical Provider, MD  losartan (COZAAR) 50 MG tablet TAKE 2 TABLETS DAILY 07/27/11  Yes Kerri Perches, MD  metFORMIN (GLUCOPHAGE) 500 MG tablet Take 500 mg by mouth daily. Dose increase effective 02/13/2011, pls do not send the 1000mg  tablet 04/17/11 04/16/12 Yes Kerri Perches, MD  Multiple Vitamin (MULITIVITAMIN WITH MINERALS) TABS Take 1 tablet by mouth daily.   Yes Historical Provider, MD  nabumetone (  RELAFEN) 750 MG tablet Take 1 tablet (750 mg total) by mouth daily. Per Patient as needed 01/01/12  Yes Malissa Hippo, MD  omeprazole (PRILOSEC) 20 MG capsule Take 20 mg by mouth 2 (two) times daily.    Yes Historical Provider, MD  polyethylene glycol powder (GLYCOLAX/MIRALAX) powder  09/13/11  Yes Kerri Perches, MD  pravastatin (PRAVACHOL) 40 MG tablet Take 1 tablet (40 mg total) by mouth every evening. 01/17/12 01/16/13 Yes Kerri Perches, MD  spironolactone (ALDACTONE) 25 MG tablet Take 1 tablet (25 mg total) by mouth daily. 05/15/11  Yes Kerri Perches, MD  zolpidem (AMBIEN) 10 MG tablet Take 1 tablet (10 mg total) by mouth at bedtime as needed. For insomnia   05/15/11  Yes Kerri Perches, MD  fluticasone [facility]) 50 MCG/ACT nasal spray Place 2 sprays into the nose daily. 09/18/11 09/17/12  Kerri Perches, MD     Allergies:  Allergies  Allergen Reactions  . Contrast Media (Iodinated Diagnostic Agents) Hives  . Lisinopril     REACTION: cough  . Buspar (Buspirone Hcl) Palpitations    Social History:   reports that she has never smoked. She has never used smokeless tobacco. She reports that she does not drink alcohol or use illicit drugs.  Family History: Family History  Problem Relation Age of Onset  . Hypertension Father   . Diabetes Father   . Diabetes Mother   . Arrhythmia Daughter     Defibrillator   . Scoliosis Daughter   . Healthy Sister   . Heart disease Daughter   . Healthy Daughter      Physical Exam: Filed Vitals:   01/22/12 1830 01/22/12 1900 01/22/12 1924 01/22/12 2109  BP:   110/61 120/58  Pulse: 73 63 76 70  Temp:      TempSrc:      Resp: 19 16 20 20   Height:      Weight:      SpO2: 100% 98% 98% 100%   Blood pressure 120/58, pulse 70, temperature 98.7 F (37.1 C), temperature source Oral, resp. rate 20, height 5\' 5"  (1.651 m), weight 110.678 kg (244 lb), SpO2 100.00%.  GEN:  Obese African American lady lying in the stretcher in no acute distress; cooperative with exam PSYCH:  alert and oriented x4;  affect is flat. HEENT: Mucous membranes pink and anicteric; PERRLA; EOM intact; no cervical lymphadenopathy nor thyromegaly or carotid bruit; no JVD; Breasts:: Not examined CHEST WALL: No tenderness CHEST: Normal respiration, clear to auscultation bilaterally HEART: Regular rate and rhythm; no murmurs rubs or gallops BACK: No kyphosis or scoliosis; no CVA tenderness ABDOMEN: Obese, soft non-tender; no masses, no organomegaly, normal abdominal bowel sounds;  no intertriginous  candida. Rectal Exam: Not done EXTREMITIES: ; age-appropriate arthropathy of the hands and knees; no edema; no ulcerations. Genitalia: not examined PULSES: 2+ and symmetric SKIN: Normal hydration no rash or ulceration CNS: Cranial nerves 2-12 grossly intact no focal lateralizing neurologic deficit. No significant sensory deficit noted   Labs & Imaging Results for orders placed during the hospital encounter of 01/22/12 (from the past 48 hour(s))  COMPREHENSIVE METABOLIC PANEL     Status: Abnormal   Collection Time   01/22/12  6:40 PM      Component Value Range Comment   Sodium 136  135 - 145 (mEq/L)    Potassium 3.7  3.5 - 5.1 (mEq/L)    Chloride 93 (*) 96 - 112 (mEq/L)    CO2 33 (*)  19 - 32 (mEq/L)    Glucose, Bld 94  70 - 99 (mg/dL)    BUN 11  6 - 23 (mg/dL)    Creatinine, Ser 1.61  0.50 - 1.10 (mg/dL)    Calcium 09.6 (*) 8.4 - 10.5 (mg/dL)    Total Protein 9.3 (*) 6.0 - 8.3 (g/dL)    Albumin 4.3  3.5 - 5.2 (g/dL)    AST 21  0 - 37 (U/L)    ALT 11  0 - 35 (U/L)    Alkaline Phosphatase 172 (*) 39 - 117 (U/L)    Total Bilirubin 0.5  0.3 - 1.2 (mg/dL)    GFR calc non Af Amer 72 (*) >90 (mL/min)    GFR calc Af Amer 84 (*) >90 (mL/min)   CBC     Status: Normal   Collection Time   01/22/12  6:40 PM      Component Value Range Comment   WBC 8.9  4.0 - 10.5 (K/uL)    RBC 4.64  3.87 - 5.11 (MIL/uL)    Hemoglobin 12.1  12.0 - 15.0 (g/dL)    HCT 04.5  40.9 - 81.1 (%)    MCV 84.3  78.0 - 100.0 (fL)    MCH 26.1  26.0 - 34.0 (pg)    MCHC 30.9  30.0 - 36.0 (g/dL)    RDW 91.4  78.2 - 95.6 (%)    Platelets 353  150 - 400 (K/uL)   PROTIME-INR     Status: Normal   Collection Time   01/22/12  6:40 PM      Component Value Range Comment   Prothrombin Time 13.1  11.6 - 15.2 (seconds)    INR 0.97  0.00 - 1.49     Dg Chest 2 View  01/22/2012  *RADIOLOGY REPORT*  Clinical Data: Right facial numbness, possible TIA  CHEST - 2 VIEW  Comparison: 03/21/2011  Findings:  The heart size and mediastinal  contours are within normal limits. Mild thoracic aortic ectasia.  Both lungs are clear. The visualized skeletal structures are unremarkable. Moderate osteophytic spurring thoracic spine.  Little change from priors. Prior cholecystectomy.  IMPRESSION: No active cardiopulmonary disease.  Original Report Authenticated By: Elsie Stain, M.D.   Ct Head Wo Contrast  01/22/2012  *RADIOLOGY REPORT*  Clinical Data:  Patient body parasthesias, hypertension, diabetes, history of TIAs  CT HEAD WITHOUT CONTRAST  Technique:  Contiguous axial images were obtained from the base of the skull through the vertex without contrast  Comparison:  03/21/2011  Findings:  The brain has a normal appearance without evidence for hemorrhage, acute infarction, hydrocephalus, or mass lesion.  There is no extra axial fluid collection.  The skull and paranasal sinuses are normal.  IMPRESSION: Normal CT of the head without contrast.  Original Report Authenticated By: Judie Petit. Ruel Favors, M.D.      Assessment Present on Admission:  .Numbness and tingling right side of body  .HYPERLIPIDEMIA .Morbid obesity .DIABETES MELLITUS, TYPE II .DEPRESSION .INSOMNIA, CHRONIC .ANXIETY .HTN, goal below 130/80   PLAN: Lady on observation to get an MRI in the morning optimizing her stroke risk factors. This lady's affect and history is still more suggestive of a stress-related disorder  Continue management of her chronic medical problem Other plans as per orders   Alaynah Schutter 01/22/2012, 9:58 PM

## 2012-01-22 NOTE — ED Notes (Addendum)
Numbness rt side of face,and rt arm , leg. Equal grips, MAE,alert,

## 2012-01-22 NOTE — ED Notes (Signed)
Attempted to call report, nurse will not take report, will call back

## 2012-01-22 NOTE — ED Provider Notes (Addendum)
History  This chart was scribed for Shelda Jakes, MD by Bennett Scrape. This patient was seen in room APA12/[facility]12 and the patient's care was started at 6:20PM.  CSN: [id number]  Arrival date & time 01/22/12  1635   First MD Initiated Contact with Patient 01/22/12 1754      Chief Complaint  Patient presents with  . Numbness    The history is provided by the patient. No language interpreter was used.    [name] [name] is a 56 y.o. female with a h/o TIA who presents to the Emergency Department complaining of 24 hours of gradual onset, gradually worsening, constant right-sided numbness described as tingling. Pt states that she began having numbness under her left lower lip yesterday morning when she woke up. She states that the numbness come and went throughout the day. She denies having any other symptoms yesterday. She reports that she began to have right-sided numbness with associated right-sided weakness and occasional SOB this morning when she woke up around 5 AM. She states that she thought her symptoms were coming from an increased collection of fluid, but when  the symptoms did not improve after taking a half of a fluid pill, she became concerned because of her h/o TIAs. Pt reports that she had a diagnosed TIA last year. She states that she had [place] studies on neck but was not admitted. She c/o diarrhea described as 3 loose BMs today that she believes is not associated with the other symptoms. She denies abdominal pain, chest pain, nausea, dysuria, worsening of chronic swelling, new rash and emesis.  She also has a h/o HTN and diabetes. Pt denies smoking and alcohol use.  Pt's PCP is Dr. Lodema Hong.  Past Medical History  Diagnosis Date  . Anxiety   . Depression   . GERD (gastroesophageal reflux disease)   . Mixed hyperlipidemia   . Essential hypertension, benign   . Low back pain   . Osteoarthritis   . Constipation   . Varicose veins   . Positive H. pylori test   . Glaucoma     Last eye exam 9/08  . Hiatal hernia   . Schatzki's ring   . Diabetes mellitus, type 2   . TIA (transient ischemic attack)     6/10  . Iron deficiency anemia     Past Surgical History  Procedure Date  . Lumbar discetomy 10/01    Dr. Fabiola Backer   . Tubal ligation 1994  . Cholecystectomy Dec 26, 2010  . Umbilical hernia repair 12/26/2010  . Spine surgery   . Colonoscopy 01/2006  . Esophagogastroduodenoscopy     Esophageal dilation 2007    Family History  Problem Relation Age of Onset  . Hypertension Father   . Diabetes Father   . Diabetes Mother   . Arrhythmia Daughter     Defibrillator   . Scoliosis Daughter   . Healthy Sister   . Heart disease Daughter   . Healthy Daughter     History  Substance Use Topics  . Smoking status: Never Smoker   . Smokeless tobacco: Never Used  . Alcohol Use: No    Review of Systems  Constitutional: Negative for fever and chills.  HENT: Negative for congestion, sore throat and neck pain.   Eyes: Negative for pain.  Respiratory: Positive for shortness of breath. Negative for cough.   Cardiovascular: Negative for chest pain.  Gastrointestinal: Positive for diarrhea. Negative for nausea, vomiting and abdominal pain.  Genitourinary: Negative for dysuria,  urgency and hematuria.  Musculoskeletal: Negative for back pain.  Skin: Negative for rash.  Neurological: Positive for numbness. Negative for dizziness.  Psychiatric/Behavioral: Negative for confusion.    Allergies  Contrast media; Lisinopril; and Buspar  Home Medications   Current Outpatient Rx  Name Route Sig Dispense Refill  . AMLODIPINE BESYLATE 10 MG PO TABS Oral Take 1 tablet (10 mg total) by mouth daily. 90 tablet 1  . ASPIRIN 325 MG PO TABS Oral Take 325 mg by mouth daily.      . COD LIVER OIL W/VIT A & D PO CAPS Oral Take 1 capsule by mouth daily.      . FUROSEMIDE 40 MG PO TABS Oral Take 40 mg by mouth daily as needed. FOR FLUID RETENTION     . HYDROCODONE-ACETAMINOPHEN  5-500 MG PO TABS Oral Take 1 tablet by mouth every 4 (four) hours as needed.      Marland Kitchen LOSARTAN POTASSIUM 50 MG PO TABS  TAKE 2 TABLETS DAILY 180 tablet 1  . METFORMIN HCL 500 MG PO TABS Oral Take 500 mg by mouth daily. Dose increase effective 02/13/2011, pls do not send the 1000mg  tablet    . ADULT MULTIVITAMIN W/MINERALS CH Oral Take 1 tablet by mouth daily.    Marland Kitchen NABUMETONE 750 MG PO TABS Oral Take 1 tablet (750 mg total) by mouth daily. Per Patient as needed 30 tablet 2  . OMEPRAZOLE 20 MG PO CPDR Oral Take 20 mg by mouth 2 (two) times daily.     Marland Kitchen POLYETHYLENE GLYCOL 3350 PO POWD      . PRAVASTATIN SODIUM 40 MG PO TABS Oral Take 1 tablet (40 mg total) by mouth every evening. 90 tablet 1    Discontinue crestor due to cost  . SPIRONOLACTONE 25 MG PO TABS Oral Take 1 tablet (25 mg total) by mouth daily. 90 tablet 1  . ZOLPIDEM TARTRATE 10 MG PO TABS Oral Take 1 tablet (10 mg total) by mouth at bedtime as needed. For insomnia   90 tablet 1  . FLUTICASONE PROPIONATE 50 MCG/ACT NA SUSP Nasal Place 2 sprays into the nose daily. 16 g 2    Triage Vitals: BP 132/75  Pulse 63  Temp(Src) 98.7 F (37.1 C) (Oral)  Resp 18  Ht 5\' 5"  (1.651 m)  Wt 244 lb (110.678 kg)  BMI 40.60 kg/m2  SpO2 99%  Physical Exam  Nursing note and vitals reviewed. Constitutional: She is oriented to person, place, and time. She appears well-developed and well-nourished.  HENT:  Head: Normocephalic and atraumatic.  Eyes: Conjunctivae and EOM are normal.  Neck: Normal range of motion. Neck supple.  Cardiovascular: Normal rate, regular rhythm and normal heart sounds.   Pulmonary/Chest: Effort normal and breath sounds normal. No respiratory distress.  Abdominal: Soft. Bowel sounds are normal. There is no tenderness.  Musculoskeletal: Normal range of motion. She exhibits no edema.  Lymphadenopathy:    She has no cervical adenopathy.  Neurological: She is alert and oriented to person, place, and time.       Strength is  intact and equal bilaterally, no pronator drift, sensory difference on the right side    Skin: Skin is warm and dry.  Psychiatric: She has a normal mood and affect. Her behavior is normal.    ED Course  Procedures (including critical care time)  DIAGNOSTIC STUDIES: Oxygen Saturation is 98% on room air, normal by my interpretation.    COORDINATION OF CARE: 6:31PM-Discussed CT scan and possible admission with  pt and pt agreed to plan.  Labs Reviewed  COMPREHENSIVE METABOLIC PANEL - Abnormal; Notable for the following:    Chloride 93 (*)    CO2 33 (*)    Calcium 10.6 (*)    Total Protein 9.3 (*)    Alkaline Phosphatase 172 (*)    GFR calc non Af Amer 72 (*)    GFR calc Af Amer 84 (*)    All other components within normal limits  CBC  PROTIME-INR  URINALYSIS, ROUTINE W REFLEX MICROSCOPIC   Dg Chest 2 View  01/22/2012  *RADIOLOGY REPORT*  Clinical Data: Right facial numbness, possible TIA  CHEST - 2 VIEW  Comparison: 03/21/2011  Findings:  The heart size and mediastinal contours are within normal limits. Mild thoracic aortic ectasia.  Both lungs are clear. The visualized skeletal structures are unremarkable. Moderate osteophytic spurring thoracic spine.  Little change from priors. Prior cholecystectomy.  IMPRESSION: No active cardiopulmonary disease.  Original Report Authenticated By: Elsie Stain, M.D.   Ct Head Wo Contrast  01/22/2012  *RADIOLOGY REPORT*  Clinical Data:  Patient body parasthesias, hypertension, diabetes, history of TIAs  CT HEAD WITHOUT CONTRAST  Technique:  Contiguous axial images were obtained from the base of the skull through the vertex without contrast  Comparison:  03/21/2011  Findings:  The brain has a normal appearance without evidence for hemorrhage, acute infarction, hydrocephalus, or mass lesion.  There is no extra axial fluid collection.  The skull and paranasal sinuses are normal.  IMPRESSION: Normal CT of the head without contrast.  Original Report  Authenticated By: Judie Petit. Ruel Favors, M.D.     Date: 01/22/2012  Rate: 75  Rhythm: normal sinus rhythm  QRS Axis: normal  Intervals: normal  ST/T Wave abnormalities: normal  Conduction Disutrbances:none  Narrative Interpretation:   Old EKG Reviewed: none available   1. Stroke   2. Other and unspecified hyperlipidemia   3. Joint pain   4. Allergic rhinitis       MDM  The patient will be admitted to observation by triad hospitalist for possible stroke and will get MRI in the morning. Patient with numbness around her mouth last evening this morning at 5 in the morning woke with right-sided numbness which was still present and tingling on exam had decreased sensation on the right compared to left no motor deficit no speech deficit. Patient with risk factors that include diabetes and hypertension and was told that in 2010 she had had a TIA. Symptoms are still present but the numbness is improving on the right side.   I personally performed the services described in this documentation, which was scribed in my presence. The recorded information has been reviewed and considered.        Shelda Jakes, MD 01/22/12 2157  Shelda Jakes, MD 01/22/12 2157

## 2012-01-23 ENCOUNTER — Observation Stay (HOSPITAL_COMMUNITY): Payer: Medicare HMO

## 2012-01-23 ENCOUNTER — Inpatient Hospital Stay (HOSPITAL_COMMUNITY): Payer: Medicare HMO

## 2012-01-23 ENCOUNTER — Encounter (HOSPITAL_COMMUNITY): Payer: Self-pay | Admitting: Internal Medicine

## 2012-01-23 DIAGNOSIS — G459 Transient cerebral ischemic attack, unspecified: Secondary | ICD-10-CM

## 2012-01-23 LAB — GLUCOSE, CAPILLARY: Glucose-Capillary: 99 mg/dL (ref 70–99)

## 2012-01-23 LAB — LIPID PANEL
Cholesterol: 140 mg/dL (ref 0–200)
VLDL: 19 mg/dL (ref 0–40)

## 2012-01-23 LAB — RAPID URINE DRUG SCREEN, HOSP PERFORMED
Barbiturates: NOT DETECTED
Benzodiazepines: NOT DETECTED
Cocaine: NOT DETECTED
Opiates: NOT DETECTED

## 2012-01-23 LAB — HEMOGLOBIN A1C: Hgb A1c MFr Bld: 6.3 % — ABNORMAL HIGH (ref <5.7)

## 2012-01-23 MED ORDER — SODIUM CHLORIDE 0.9 % IJ SOLN
INTRAMUSCULAR | Status: AC
  Administered 2012-01-23: 02:00:00
  Filled 2012-01-23: qty 3

## 2012-01-23 MED ORDER — HYDROCODONE-ACETAMINOPHEN 5-325 MG PO TABS
1.0000 | ORAL_TABLET | ORAL | Status: DC | PRN
  Administered 2012-01-23: 1 via ORAL
  Filled 2012-01-23: qty 1

## 2012-01-23 MED ORDER — POTASSIUM CHLORIDE IN NACL 20-0.9 MEQ/L-% IV SOLN: INTRAVENOUS | Status: DC

## 2012-01-23 MED ORDER — GABAPENTIN 100 MG PO CAPS: 100.0000 mg | ORAL_CAPSULE | Freq: Two times a day (BID) | ORAL | Status: DC | PRN

## 2012-01-23 NOTE — Progress Notes (Signed)
UR Chart Review Completed  

## 2012-01-23 NOTE — Discharge Summary (Signed)
Physician Discharge Summary  [name] MRN: [id number] DOB/AGE: 06-02-1956 56 y.o.  PCP: [name] Overman, MD, MD   Admit date: 01/22/2012 Discharge date: 01/23/2012  Discharge Diagnoses:  1. Right-sided numbness and tingling; possible neuropathic symptoms from diabetes mellitus; also consider psychosomatic manifestations. MRI and MRA of the brain were normal. Results of the 2-D echocardiogram and carotid ultrasound were pending at the time of discharge. 2. Borderline hypercalcemia likely secondary to mild dehydration. 3. Type 2 diabetes mellitus. Recent hemoglobin A1c of 6.4. 4. Hypertension. 5. Chronic insomnia. 6. Chronic anxiety. 7. Hyperlipidemia. The patient's total cholesterol was 140, triglycerides 96, HDL cholesterol 49, and LDL cholesterol 72. 8. Chronic abdominal pain. 9. Morbid obesity.  10. Esophageal web, status post dilatation, small sliding hernia, per EGD by Dr. Karilyn Cota on August 11 2011.     Medication List  As of 01/23/2012  1:10 PM   TAKE these medications         amLODipine 10 MG tablet   Commonly known as: NORVASC   Take 1 tablet (10 mg total) by mouth daily.      aspirin 325 MG tablet   Take 325 mg by mouth daily.      Cod Liver Oil w/Vit A & D Caps   Take 1 capsule by mouth daily.      fluticasone 50 MCG/ACT nasal spray   Commonly known as: FLONASE   Place 2 sprays into the nose daily.      furosemide 40 MG tablet   Commonly known as: LASIX   Take 40 mg by mouth daily as needed. FOR FLUID RETENTION      gabapentin 100 MG capsule   Commonly known as: NEURONTIN   Take 1 capsule (100 mg total) by mouth 2 (two) times daily as needed (For  numbness, tingling, and burning sensation. ).      HYDROcodone-acetaminophen 5-500 MG per tablet   Commonly known as: VICODIN   Take 1 tablet by mouth every 4 (four) hours as needed.      losartan 50 MG tablet   Commonly known as: COZAAR   TAKE 2 TABLETS DAILY      metFORMIN 500 MG tablet   Commonly  known as: GLUCOPHAGE   Take 500 mg by mouth daily. Dose increase effective 02/13/2011, pls do not send the 1000mg  tablet      mulitivitamin with minerals Tabs   Take 1 tablet by mouth daily.      nabumetone 750 MG tablet   Commonly known as: RELAFEN   Take 1 tablet (750 mg total) by mouth daily. Per Patient as needed      omeprazole 20 MG capsule   Commonly known as: PRILOSEC   Take 20 mg by mouth 2 (two) times daily.      polyethylene glycol powder powder   Commonly known as: GLYCOLAX/MIRALAX      pravastatin 40 MG tablet   Commonly known as: PRAVACHOL   Take 1 tablet (40 mg total) by mouth every evening.      spironolactone 25 MG tablet   Commonly known as: ALDACTONE   Take 1 tablet (25 mg total) by mouth daily.      zolpidem 10 MG tablet   Commonly known as: AMBIEN   Take 1 tablet (10 mg total) by mouth at bedtime as needed. For insomnia               Discharge Condition: Improved and stable.  Disposition: 01-Home or Self Care  Consults: none.   Significant Diagnostic Studies: Dg Chest 2 View  01/22/2012  *RADIOLOGY REPORT*  Clinical Data: Right facial numbness, possible TIA  CHEST - 2 VIEW  Comparison: 03/21/2011  Findings:  The heart size and mediastinal contours are within normal limits. Mild thoracic aortic ectasia.  Both lungs are clear. The visualized skeletal structures are unremarkable. Moderate osteophytic spurring thoracic spine.  Little change from priors. Prior cholecystectomy.  IMPRESSION: No active cardiopulmonary disease.  Original Report Authenticated By: [name] [name], M.D.   Ct Head Wo Contrast  01/22/2012  *RADIOLOGY REPORT*  Clinical Data:  Patient body parasthesias, hypertension, diabetes, history of TIAs  CT HEAD WITHOUT CONTRAST  Technique:  Contiguous axial images were obtained from the base of the skull through the vertex without contrast  Comparison:  03/21/2011  Findings:  The brain has a normal appearance without evidence for hemorrhage,  acute infarction, hydrocephalus, or mass lesion.  There is no extra axial fluid collection.  The skull and paranasal sinuses are normal.  IMPRESSION: Normal CT of the head without contrast.  Original Report Authenticated By: [name] [name]. Ruel Favors, M.D.   Mr [name] [name] Head Wo Contrast  01/23/2012  *RADIOLOGY REPORT*  Clinical Data:  56 year old female with right facial numbness. TIA.  Comparison: Head CT 01/22/2012.  Brain MRI 03/21/2011.  MRI HEAD WITHOUT CONTRAST  Technique: Multiplanar, multiecho pulse sequences of the brain and surrounding structures were obtained according to standard protocol without intravenous contrast.  Findings: Normal cerebral volume. No restricted diffusion to suggest acute infarction.  No midline shift, mass effect, evidence of mass lesion, ventriculomegaly, extra-axial collection or acute intracranial hemorrhage.  Cervicomedullary junction and pituitary are within normal limits.  Major intracranial vascular flow voids are preserved. Stable and normal gray and white matter signal. Negative visualized cervical spine.  Normal bone marrow signal.  Visualized orbit soft tissues are within normal limits.  Visualized paranasal sinuses and mastoids are clear.  Negative scalp soft tissues.  IMPRESSION: 1.  Stable and normal noncontrast MRI appearance of the brain. 2.  MRA findings are below.  MRA HEAD WITHOUT CONTRAST  Technique: Angiographic images of the Circle of Willis were obtained using MRA technique without  intravenous contrast.  Findings: Study is mildly degraded by motion artifact despite repeated imaging attempts.  Codominant distal vertebral arteries was antegrade flow in the posterior circulation.  Normal vertebrobasilar junction.  Dominant AICA vessels.  No basilar artery stenosis.  Superior cerebellar artery and PCA origins are normal.  Diminutive or absent posterior communicating arteries.  Bilateral PCA branches are within normal limits.  Antegrade flow in both ICA siphons.  No ICA  stenosis.  Ophthalmic artery origins are within normal limits.  Normal carotid termini, MCA and ACA origins.  Normal anterior communicating artery. Visualized ACA branches are within normal limits.  Visualized bilateral MCA branches are within normal limits.  IMPRESSION: Negative intracranial MRA.  Original Report Authenticated By: Harley Hallmark, M.D.   Mri Brain Without Contrast  01/23/2012  *RADIOLOGY REPORT*  Clinical Data:  56 year old female with right facial numbness. TIA.  Comparison: Head CT 01/22/2012.  Brain MRI 03/21/2011.  MRI HEAD WITHOUT CONTRAST  Technique: Multiplanar, multiecho pulse sequences of the brain and surrounding structures were obtained according to standard protocol without intravenous contrast.  Findings: Normal cerebral volume. No restricted diffusion to suggest acute infarction.  No midline shift, mass effect, evidence of mass lesion, ventriculomegaly, extra-axial collection or acute intracranial hemorrhage.  Cervicomedullary junction and pituitary are within normal limits.  Major intracranial  vascular flow voids are preserved. Stable and normal gray and white matter signal. Negative visualized cervical spine.  Normal bone marrow signal.  Visualized orbit soft tissues are within normal limits.  Visualized paranasal sinuses and mastoids are clear.  Negative scalp soft tissues.  IMPRESSION: 1.  Stable and normal noncontrast MRI appearance of the brain. 2.  MRA findings are below.  MRA HEAD WITHOUT CONTRAST  Technique: Angiographic images of the Circle of Willis were obtained using MRA technique without  intravenous contrast.  Findings: Study is mildly degraded by motion artifact despite repeated imaging attempts.  Codominant distal vertebral arteries was antegrade flow in the posterior circulation.  Normal vertebrobasilar junction.  Dominant AICA vessels.  No basilar artery stenosis.  Superior cerebellar artery and PCA origins are normal.  Diminutive or absent posterior communicating  arteries.  Bilateral PCA branches are within normal limits.  Antegrade flow in both ICA siphons.  No ICA stenosis.  Ophthalmic artery origins are within normal limits.  Normal carotid termini, MCA and ACA origins.  Normal anterior communicating artery. Visualized ACA branches are within normal limits.  Visualized bilateral MCA branches are within normal limits.  IMPRESSION: Negative intracranial MRA.  Original Report Authenticated By: Harley Hallmark, M.D.   ECHO: Results pending. CAROTID ULTRASOUND: Results pending.  Microbiology: No results found for this or any previous visit (from the past 240 hour(s)).   Labs: Results for orders placed during the hospital encounter of 01/22/12 (from the past 48 hour(s))  COMPREHENSIVE METABOLIC PANEL     Status: Abnormal   Collection Time   01/22/12  6:40 PM      Component Value Range Comment   Sodium 136  135 - 145 (mEq/L)    Potassium 3.7  3.5 - 5.1 (mEq/L)    Chloride 93 (*) 96 - 112 (mEq/L)    CO2 33 (*) 19 - 32 (mEq/L)    Glucose, Bld 94  70 - 99 (mg/dL)    BUN 11  6 - 23 (mg/dL)    Creatinine, Ser 1.61  0.50 - 1.10 (mg/dL)    Calcium 09.6 (*) 8.4 - 10.5 (mg/dL)    Total Protein 9.3 (*) 6.0 - 8.3 (g/dL)    Albumin 4.3  3.5 - 5.2 (g/dL)    AST 21  0 - 37 (U/L)    ALT 11  0 - 35 (U/L)    Alkaline Phosphatase 172 (*) 39 - 117 (U/L)    Total Bilirubin 0.5  0.3 - 1.2 (mg/dL)    GFR calc non Af Amer 72 (*) >90 (mL/min)    GFR calc Af Amer 84 (*) >90 (mL/min)   CBC     Status: Normal   Collection Time   01/22/12  6:40 PM      Component Value Range Comment   WBC 8.9  4.0 - 10.5 (K/uL)    RBC 4.64  3.87 - 5.11 (MIL/uL)    Hemoglobin 12.1  12.0 - 15.0 (g/dL)    HCT 04.5  40.9 - 81.1 (%)    MCV 84.3  78.0 - 100.0 (fL)    MCH 26.1  26.0 - 34.0 (pg)    MCHC 30.9  30.0 - 36.0 (g/dL)    RDW 91.4  78.2 - 95.6 (%)    Platelets 353  150 - 400 (K/uL)   PROTIME-INR     Status: Normal   Collection Time   01/22/12  6:40 PM      Component Value Range  Comment   Prothrombin Time  13.1  11.6 - 15.2 (seconds)    INR 0.97  0.00 - 1.49    URINALYSIS, ROUTINE W REFLEX MICROSCOPIC     Status: Abnormal   Collection Time   01/22/12 11:08 PM      Component Value Range Comment   Color, Urine STRAW (*) YELLOW     APPearance CLEAR  CLEAR     Specific Gravity, Urine <1.005 (*) 1.005 - 1.030     pH 5.5  5.0 - 8.0     Glucose, UA NEGATIVE  NEGATIVE (mg/dL)    Hgb urine dipstick TRACE (*) NEGATIVE     Bilirubin Urine NEGATIVE  NEGATIVE     Ketones, ur NEGATIVE  NEGATIVE (mg/dL)    Protein, ur NEGATIVE  NEGATIVE (mg/dL)    Urobilinogen, UA 0.2  0.0 - 1.0 (mg/dL)    Nitrite NEGATIVE  NEGATIVE     Leukocytes, UA NEGATIVE  NEGATIVE    URINE MICROSCOPIC-ADD ON     Status: Abnormal   Collection Time   01/22/12 11:08 PM      Component Value Range Comment   Squamous Epithelial / LPF MANY (*) RARE     WBC, UA 3-6  <3 (WBC/hpf)    RBC / HPF 3-6  <3 (RBC/hpf)    Bacteria, UA FEW (*) RARE    LIPID PANEL     Status: Normal   Collection Time   01/23/12  5:36 AM      Component Value Range Comment   Cholesterol 140  0 - 200 (mg/dL)    Triglycerides 96  <409 (mg/dL)    HDL 49  >81 (mg/dL)    Total CHOL/HDL Ratio 2.9      VLDL 19  0 - 40 (mg/dL)    LDL Cholesterol 72  0 - 99 (mg/dL)   MAGNESIUM     Status: Normal   Collection Time   01/23/12  5:37 AM      Component Value Range Comment   Magnesium 1.9  1.5 - 2.5 (mg/dL)   PHOSPHORUS     Status: Normal   Collection Time   01/23/12  5:37 AM      Component Value Range Comment   Phosphorus 3.4  2.3 - 4.6 (mg/dL)   URINE RAPID DRUG SCREEN (HOSP PERFORMED)     Status: Normal   Collection Time   01/23/12  5:58 AM      Component Value Range Comment   Opiates NONE DETECTED  NONE DETECTED     Cocaine NONE DETECTED  NONE DETECTED     Benzodiazepines NONE DETECTED  NONE DETECTED     Amphetamines NONE DETECTED  NONE DETECTED     Tetrahydrocannabinol NONE DETECTED  NONE DETECTED     Barbiturates NONE DETECTED  NONE  DETECTED    GLUCOSE, CAPILLARY     Status: Abnormal   Collection Time   01/23/12  8:12 AM      Component Value Range Comment   Glucose-Capillary 109 (*) 70 - 99 (mg/dL)    Comment 1 Documented in Chart      Comment 2 Notify RN     GLUCOSE, CAPILLARY     Status: Normal   Collection Time   01/23/12 11:33 AM      Component Value Range Comment   Glucose-Capillary 99  70 - 99 (mg/dL)    Comment 1 Documented in Chart      Comment 2 Notify RN        HPI : The patient is  a 56 year old woman with multiple medical problems including type 2 diabetes mellitus, chronic abdominal pain, depression, anxiety, hypertension , and reported previous TIA. She presented to the emergency department on January 22, 2012 with a chief complaint of episodic numbness and tingling on the right side of her body. In the emergency department, she was noted to be afebrile and hemodynamically stable. Her lab data were significant for a serum calcium of 10.6, alkaline phosphatase of 172, and normal CBC. Her urinalysis revealed many squamous cells and a few WBCs. Her urine drug screen was negative. The CT scan of her head was normal. Her EKG revealed normal sinus rhythm with a heart rate of 75 beats per minute and with no abnormalities. She was admitted for further evaluation and management.  HOSPITAL COURSE: The patient was started on IV fluid hydration. Most if not all of her chronic medications were continued. For further evaluation, a number of studies were ordered. Her blood magnesium level and phosphorus level were both within normal limits. The results of her fasting lipid profile were dictated above. The results of her hemoglobin A1c which was ordered in one week prior to this hospitalization, a 6.4. A TSH was not ordered as apparently it was ordered last week by her primary care physician. However I do not have the results. The MRI and MRA of her brain were normal. The results of the carotid ultrasound and 2-D echocardiogram were  pending at the time of hospital discharge. The results will be followed up upon by the dictating physician. If they are concerning, both the patient and Dr. Lodema Hong will be notified.  The patient remained hemodynamically stable and afebrile. The numbness and tingling subsided but did not completely resolve. Neurologically, there was no focal deficits and no obvious sensation abnormalities. It is possible that the patient's symptomatology is secondary to diabetic neuropathy, although, a psychosomatic presentation is not completely ruled out. Nevertheless, it was decided to start the patient on when necessary gabapentin as a trial. She was instructed to followup with her primary care physician, Dr. Lodema Hong, for further evaluation and management.  Discharge Exam: Blood pressure 134/86, pulse 61, temperature 98 F (36.7 C), temperature source Oral, resp. rate 20, height 5\' 5"  (1.651 m), weight 107.593 kg (237 lb 3.2 oz), SpO2 100.00%.   lungs: Clear to auscultation bilaterally. Heart: S1, S2, with no murmurs rubs or gallops. Abdomen: Positive bowel sounds, soft, obese, mildly tender without distention, guarding, masses, or rigidity. Extremities: No pedal edema. Neurologic: She is alert and oriented x3. Cranial nerves II through XII are intact. Particularly, sensation is grossly intact symmetrically. Strength is 5 over 5 throughout. Gait is grossly intact. Psychological: She is alert and oriented. She is cooperative. Her speech is clear. She appears to have some anxiety as she discusses her chronic abdominal pain.    Discharge Orders    Future Appointments: Provider: Department: Dept Phone: Center:   06/05/2012 8:30 AM Kerri Perches, MD [facility] [phone] [facility]     Future Orders Please Complete By Expires   Diet - low sodium heart healthy      Increase activity slowly      Discharge instructions      Comments:   Followup with your primary care physician in one to 2 weeks. Call for  the appointment.      Follow-up Information    Follow up with [name] Overman, MD. Schedule an appointment as soon as possible for a visit in 2 weeks.   Contact information:  [address], [address] [place] [address] [phone]          Total discharge time: 35 minutes.   Signed: Herbert Marken 01/23/2012, 1:10 PM

## 2012-01-23 NOTE — Progress Notes (Signed)
*  PRELIMINARY RESULTS* Echocardiogram 2D Echocardiogram has been performed.  [name] [name] 01/23/2012, 11:33 AM

## 2012-01-24 NOTE — Progress Notes (Signed)
Discharge summary sent to payer through [facility]  

## 2012-01-25 ENCOUNTER — Emergency Department (HOSPITAL_COMMUNITY): Payer: Medicare HMO

## 2012-01-25 ENCOUNTER — Encounter (HOSPITAL_COMMUNITY): Payer: Self-pay | Admitting: *Deleted

## 2012-01-25 ENCOUNTER — Emergency Department (HOSPITAL_COMMUNITY)
Admission: EM | Admit: 2012-01-25 | Discharge: 2012-01-25 | Disposition: A | Discharge status: Home / Self Care | Payer: Medicare HMO | Attending: Emergency Medicine | Admitting: Emergency Medicine

## 2012-01-25 DIAGNOSIS — Z79899 Other long term (current) drug therapy: Secondary | ICD-10-CM | POA: Insufficient documentation

## 2012-01-25 DIAGNOSIS — R0989 Other specified symptoms and signs involving the circulatory and respiratory systems: Secondary | ICD-10-CM | POA: Insufficient documentation

## 2012-01-25 DIAGNOSIS — K219 Gastro-esophageal reflux disease without esophagitis: Secondary | ICD-10-CM | POA: Insufficient documentation

## 2012-01-25 DIAGNOSIS — F341 Dysthymic disorder: Secondary | ICD-10-CM | POA: Insufficient documentation

## 2012-01-25 DIAGNOSIS — R0609 Other forms of dyspnea: Secondary | ICD-10-CM | POA: Insufficient documentation

## 2012-01-25 DIAGNOSIS — G8929 Other chronic pain: Secondary | ICD-10-CM | POA: Insufficient documentation

## 2012-01-25 DIAGNOSIS — R5383 Other fatigue: Secondary | ICD-10-CM | POA: Insufficient documentation

## 2012-01-25 DIAGNOSIS — R202 Paresthesia of skin: Secondary | ICD-10-CM

## 2012-01-25 DIAGNOSIS — R0600 Dyspnea, unspecified: Secondary | ICD-10-CM

## 2012-01-25 DIAGNOSIS — R0602 Shortness of breath: Secondary | ICD-10-CM | POA: Insufficient documentation

## 2012-01-25 DIAGNOSIS — R5381 Other malaise: Secondary | ICD-10-CM | POA: Insufficient documentation

## 2012-01-25 DIAGNOSIS — R209 Unspecified disturbances of skin sensation: Secondary | ICD-10-CM | POA: Insufficient documentation

## 2012-01-25 LAB — BASIC METABOLIC PANEL
Chloride: 95 mEq/L — ABNORMAL LOW (ref 96–112)
GFR calc Af Amer: 90 mL/min — ABNORMAL LOW (ref >90)
Potassium: 4.2 mEq/L (ref 3.5–5.1)
Sodium: 134 mEq/L — ABNORMAL LOW (ref 135–145)

## 2012-01-25 LAB — CBC
Platelets: 346 10*3/uL (ref 150–400)
RDW: 15.2 % (ref 11.5–15.5)
WBC: 11.2 10*3/uL — ABNORMAL HIGH (ref 4.0–10.5)

## 2012-01-25 NOTE — ED Notes (Signed)
Patient states that she has had SOB and weakness for 3 days.  Pt went to [facility] for same and spent the night at the hospital. Pt was discharged on Tuesday. Pt has cont. To have SOB and weakness.  Pt in no resp distress at this time.

## 2012-01-25 NOTE — ED Notes (Signed)
MD at bedside. 

## 2012-01-25 NOTE — ED Provider Notes (Signed)
History     CSN: [id number]  Arrival date & time 01/25/12  1844   First MD Initiated Contact with Patient 01/25/12 2103      Chief Complaint  Patient presents with  . Weakness  . Shortness of Breath    (Consider location/radiation/quality/duration/timing/severity/associated sxs/prior treatment) Patient is a 56 y.o. female presenting with weakness and shortness of breath. The history is provided by the patient.  Weakness The primary symptoms include focal weakness. Primary symptoms do not include headaches, fever, nausea or vomiting. The symptoms began 6 to 12 hours ago. The symptoms are unchanged. The neurological symptoms are focal.  Region/motion of weakness: Entire R side, per patient.  Additional symptoms include weakness. Workup history includes MRI and CT scan.  Shortness of Breath  The current episode started today. The problem occurs occasionally. The problem has been unchanged. The problem is moderate. The symptoms are relieved by nothing. The symptoms are aggravated by nothing. Associated symptoms include shortness of breath. Pertinent negatives include no chest pain, no chest pressure, no fever and no cough. She has had prior hospitalizations. She has been behaving normally. There were no sick contacts. Recently, medical care has been given at another facility. Services received include tests performed and one or more referrals.    Past Medical History  Diagnosis Date  . Anxiety   . Depression   . GERD (gastroesophageal reflux disease)   . Mixed hyperlipidemia   . Essential hypertension, benign   . Low back pain   . Osteoarthritis   . Constipation   . Varicose veins   . Positive H. pylori test   . Glaucoma     Last eye exam 9/08  . Hiatal hernia   . Schatzki's ring   . Diabetes mellitus, type 2   . TIA (transient ischemic attack)     6/10  . Iron deficiency anemia   . Chronic abdominal pain     Past Surgical History  Procedure Date  . Lumbar discetomy  10/01    Dr. Fabiola Backer   . Tubal ligation 1994  . Cholecystectomy Dec 26, 2010  . Umbilical hernia repair 12/26/2010  . Spine surgery   . Colonoscopy 01/2006  . Esophagogastroduodenoscopy     Esophageal dilation 2007    Family History  Problem Relation Age of Onset  . Hypertension Father   . Diabetes Father   . Diabetes Mother   . Arrhythmia Daughter     Defibrillator   . Scoliosis Daughter   . Healthy Sister   . Heart disease Daughter   . Healthy Daughter     History  Substance Use Topics  . Smoking status: Never Smoker   . Smokeless tobacco: Never Used  . Alcohol Use: No    OB History    Grav Para Term Preterm Abortions TAB SAB Ect Mult Living                  Review of Systems  Constitutional: Positive for fatigue. Negative for fever.  HENT: Negative for neck pain.   Respiratory: Positive for shortness of breath. Negative for cough and chest tightness.   Cardiovascular: Negative for chest pain.  Gastrointestinal: Negative for nausea, vomiting, abdominal pain and diarrhea.  Genitourinary: Negative for dysuria.  Skin: Negative for rash.  Neurological: Positive for focal weakness and weakness. Negative for headaches.  All other systems reviewed and are negative.    Allergies  Contrast media; Lisinopril; and Buspar  Home Medications   Current Outpatient Rx  Name  Route Sig Dispense Refill  . AMLODIPINE BESYLATE 10 MG PO TABS Oral Take 10 mg by mouth daily.    . ASPIRIN 325 MG PO TABS Oral Take 325 mg by mouth daily.      . COD LIVER OIL W/VIT A & D PO CAPS Oral Take 1 capsule by mouth daily.      . FUROSEMIDE 40 MG PO TABS Oral Take 20-40 mg by mouth daily.     Marland Kitchen GABAPENTIN 100 MG PO CAPS Oral Take 100 mg by mouth 2 (two) times daily as needed. For numbness, tingling or burning sensation    . HYDROCODONE-ACETAMINOPHEN 5-500 MG PO TABS Oral Take 1 tablet by mouth every 4 (four) hours as needed. For pain    . LOSARTAN POTASSIUM 50 MG PO TABS Oral Take 100 mg by  mouth daily.     Marland Kitchen METFORMIN HCL 500 MG PO TABS Oral Take 500 mg by mouth daily. Dose increase effective 02/13/2011, pls do not send the 1000mg  tablet    . NABUMETONE 750 MG PO TABS Oral Take 750 mg by mouth daily as needed. For pain/inflammation    . OMEPRAZOLE 20 MG PO CPDR Oral Take 20 mg by mouth daily.     Marland Kitchen POLYETHYLENE GLYCOL 3350 PO POWD Oral Take 17 g by mouth daily.     Marland Kitchen PRAVASTATIN SODIUM 40 MG PO TABS Oral Take 40 mg by mouth every evening.    Marland Kitchen SPIRONOLACTONE 25 MG PO TABS Oral Take 25 mg by mouth daily.    Marland Kitchen ZOLPIDEM TARTRATE 10 MG PO TABS Oral Take 10 mg by mouth at bedtime as needed. For insomnia      BP 132/87  Pulse 83  Temp(Src) 98.6 F (37 C) (Oral)  Resp 16  SpO2 100%  Physical Exam  Nursing note and vitals reviewed. Constitutional: She is oriented to person, place, and time. She appears well-developed and well-nourished.  HENT:  Head: Normocephalic and atraumatic.  Eyes: EOM are normal.  Neck: Normal range of motion.  Cardiovascular: Normal rate, regular rhythm and normal heart sounds.   Pulmonary/Chest: Effort normal and breath sounds normal. No respiratory distress. She has no decreased breath sounds. She has no wheezes. She has no rhonchi. She has no rales.  Abdominal: Soft. There is no tenderness.  Musculoskeletal: Normal range of motion.  Neurological: She is alert and oriented to person, place, and time. She has normal strength. A sensory deficit is present. No cranial nerve deficit.       No pronator drift   Skin: Skin is warm and dry.  Psychiatric: She has a normal mood and affect.    ED Course  Procedures (including critical care time)   Date: 01/25/2012  Rate: 81  Rhythm: normal sinus rhythm  QRS Axis: normal  Intervals: normal  ST/T Wave abnormalities: normal  Conduction Disutrbances:none  Narrative Interpretation:   Old EKG Reviewed: unchanged    Labs Reviewed  CBC - Abnormal; Notable for the following:    WBC 11.2 (*)    All  other components within normal limits  BASIC METABOLIC PANEL - Abnormal; Notable for the following:    Sodium 134 (*)    Chloride 95 (*)    Glucose, Bld 125 (*)    GFR calc non Af Amer 77 (*)    GFR calc Af Amer 90 (*)    All other components within normal limits   Dg Chest 2 View  01/25/2012  *RADIOLOGY REPORT*  Clinical Data: Shortness of  breath and weakness.  CHEST - 2 VIEW  Comparison: Chest x-ray 42,013.  Findings: Lung volumes are normal.  No consolidative airspace disease.  No pleural effusions.  No pneumothorax.  No pulmonary nodule or mass noted.  Pulmonary vasculature and the cardiomediastinal silhouette are within normal limits.  Surgical clips projecting over the right upper quadrant of the abdomen consistent with prior cholecystectomy.  IMPRESSION: 1. No radiographic evidence of acute cardiopulmonary disease. 2.  Status post cholecystectomy.  Original Report Authenticated By: Florencia Reasons, M.D.     1. Dyspnea   2. Paresthesia       MDM  Pt presents with c/o dyspnea. Initially when questioned she states it started today. No cough, chest pain, fevers, LE changes. Also c/o R sided "heaviness" and paresthesias. She also states this started today.  When reviewing EMR, it appears she had both dyspnea and these neuro symptoms 3 days ago when seen. She was admitted for CVA/TIA workup with negative MRI/MRI, echo, carotid u/s.  It appears symptoms have persisted.  Exam here without focal objective deficits. She does have subjective "different" sensation on entire R side of body, but sensation is intact. Lung exam benign. 100% O2 sat on RA.   Labs benign as above. No anemia. No renal failure. CXR also without acute process. EKG benign.   Do not feel c/w PE. Doubt ACS/anginal equivalent. Has had no chest pain. No hypoxia or tachycardia.  No s/sx of DVT. Doubt TIA/CVA.  She had extensive workup with monitoring in past 2 days.  She has close f/u arranged.  Felt safe for d/c.          Donnamarie Poag, MD 01/25/12 2217

## 2012-01-25 NOTE — ED Provider Notes (Signed)
I have reviewed and agree with the ECG interpretation(s) documented by the resident.  I have personally seen and examined the patient.  I have discussed the plan of care with the resident.  I have reviewed the documentation on PMH/FH/Soc. History.  I have reviewed the documentation of the resident and agree.  Pt well appearing, no neuro deficits, recent extensive neuro workup unremarkable except for small carotid plaque.  Lung sound clear, no distress.  She has f/u later this month with PCP.  Stable for d/c  Joya Gaskins, MD 01/25/12 2343

## 2012-01-29 NOTE — Telephone Encounter (Signed)
Apt has been scheduled for 04/23/12 with Dorene Ar, NP

## 2012-01-29 NOTE — Telephone Encounter (Signed)
Noted; OV in 3 months.

## 2012-01-30 ENCOUNTER — Encounter: Payer: Self-pay | Admitting: Family Medicine

## 2012-01-30 ENCOUNTER — Ambulatory Visit (INDEPENDENT_AMBULATORY_CARE_PROVIDER_SITE_OTHER): Payer: Medicare HMO | Admitting: Family Medicine

## 2012-01-30 VITALS — BP 102/66 | HR 97 | Resp 18 | Ht 65.0 in | Wt 236.1 lb

## 2012-01-30 DIAGNOSIS — R1012 Left upper quadrant pain: Secondary | ICD-10-CM | POA: Insufficient documentation

## 2012-01-30 DIAGNOSIS — E119 Type 2 diabetes mellitus without complications: Secondary | ICD-10-CM

## 2012-01-30 DIAGNOSIS — R109 Unspecified abdominal pain: Secondary | ICD-10-CM

## 2012-01-30 DIAGNOSIS — M792 Neuralgia and neuritis, unspecified: Secondary | ICD-10-CM

## 2012-01-30 DIAGNOSIS — E785 Hyperlipidemia, unspecified: Secondary | ICD-10-CM

## 2012-01-30 DIAGNOSIS — R200 Anesthesia of skin: Secondary | ICD-10-CM

## 2012-01-30 DIAGNOSIS — R1011 Right upper quadrant pain: Secondary | ICD-10-CM

## 2012-01-30 DIAGNOSIS — R209 Unspecified disturbances of skin sensation: Secondary | ICD-10-CM

## 2012-01-30 DIAGNOSIS — IMO0002 Reserved for concepts with insufficient information to code with codable children: Secondary | ICD-10-CM

## 2012-01-30 DIAGNOSIS — R202 Paresthesia of skin: Secondary | ICD-10-CM

## 2012-01-30 NOTE — Progress Notes (Signed)
  Subjective:    Patient ID: [name] [name], female    DOB: [birth year], 56 y.o.   MRN: [id number]  HPI Pt in with her daughter c/o continued Right sided abdominal pain with nausea intermittently also the left side, a feeling of fullness, no vomit,no change in bowel movements. Requests again the opinion of a 2nd GI doc. Complaints started following cholecystectomy Here for f/u of recent hospitalization due to acute onset of right upper and lower weakness and numbness, she had epidural injections for right radicular symptoms in 2001 per chart review. Hospitalization was from 4/8 to 4/9 2013, head scans were normal    Review of Systems See HPI Denies recent fever or chills. Denies sinus pressure, nasal congestion, ear pain or sore throat. Denies chest congestion, productive cough or wheezing. Denies chest pains, palpitations and leg swelling Denies abdominal pain, nausea, vomiting,diarrhea or constipation.   Denies dysuria, frequency, hesitancy or incontinence. Chronic back pain with mild limitation in mobility. Denies headaches, seizures, numbness, or tingling. Denies depression, anxiety or insomnia. Denies skin break down or rash.        Objective:   Physical Exam Patient alert and oriented and in no cardiopulmonary distress.Anxious  HEENT: No facial asymmetry, EOMI, no sinus tenderness,  oropharynx pink and moist.  Neck supple no adenopathy.  Chest: Clear to auscultation bilaterally.  CVS: S1, S2 no murmurs, no S3.  ABD: Soft superficial tenderness on right side, no guarding or rebound Ext: No edema  MS: Adequate though reduced  ROM spine,adequate in  shoulders, hips and knees.  Skin: Intact, no ulcerations or rash noted.  Psych: Good eye contact, normal affect. Memory intact not anxious or depressed appearing.  CNS: CN 2-12 intact, power and sensation  Reduced in right  upper and lower extremities       Assessment & Plan:

## 2012-01-30 NOTE — Patient Instructions (Signed)
F/u in 6 weeks  Please call if you need to see me beforeYou are  Being  Referred to a neurologist as well as to the GI doctor of your choice in [facility].  You are being referred for MRI of your spine, since you have right sided weakness and numbness,

## 2012-02-07 ENCOUNTER — Ambulatory Visit: Payer: Medicare HMO | Admitting: Family Medicine

## 2012-02-08 ENCOUNTER — Ambulatory Visit (INDEPENDENT_AMBULATORY_CARE_PROVIDER_SITE_OTHER): Payer: Medicare HMO | Admitting: Internal Medicine

## 2012-02-16 ENCOUNTER — Telehealth: Payer: Self-pay | Admitting: Family Medicine

## 2012-02-16 NOTE — Telephone Encounter (Signed)
opls schedule appt with dr pt is requesting, I have entered the referral

## 2012-02-23 NOTE — Telephone Encounter (Signed)
Pt has appt with dr. Lorin Picket for 03/13/2012 11:30. Pt is aware of this appt.

## 2012-02-26 ENCOUNTER — Ambulatory Visit: Payer: Medicare HMO | Admitting: Family Medicine

## 2012-02-26 DIAGNOSIS — M792 Neuralgia and neuritis, unspecified: Secondary | ICD-10-CM | POA: Insufficient documentation

## 2012-02-26 NOTE — Assessment & Plan Note (Signed)
Controlled, no change in medication  

## 2012-02-28 ENCOUNTER — Ambulatory Visit (HOSPITAL_COMMUNITY)
Admission: RE | Admit: 2012-02-28 | Discharge: 2012-02-28 | Disposition: A | Discharge status: Home / Self Care | Payer: Medicare HMO | Source: Ambulatory Visit | Attending: Family Medicine | Admitting: Family Medicine

## 2012-02-28 ENCOUNTER — Telehealth: Payer: Self-pay | Admitting: Family Medicine

## 2012-02-28 ENCOUNTER — Ambulatory Visit (HOSPITAL_COMMUNITY): Payer: Medicare HMO

## 2012-02-28 DIAGNOSIS — R200 Anesthesia of skin: Secondary | ICD-10-CM

## 2012-02-28 DIAGNOSIS — M5124 Other intervertebral disc displacement, thoracic region: Secondary | ICD-10-CM | POA: Insufficient documentation

## 2012-02-28 DIAGNOSIS — R209 Unspecified disturbances of skin sensation: Secondary | ICD-10-CM | POA: Insufficient documentation

## 2012-02-28 DIAGNOSIS — M792 Neuralgia and neuritis, unspecified: Secondary | ICD-10-CM

## 2012-02-28 DIAGNOSIS — M5126 Other intervertebral disc displacement, lumbar region: Secondary | ICD-10-CM | POA: Insufficient documentation

## 2012-02-28 DIAGNOSIS — M5137 Other intervertebral disc degeneration, lumbosacral region: Secondary | ICD-10-CM | POA: Insufficient documentation

## 2012-02-28 DIAGNOSIS — R202 Paresthesia of skin: Secondary | ICD-10-CM

## 2012-02-28 DIAGNOSIS — M51379 Other intervertebral disc degeneration, lumbosacral region without mention of lumbar back pain or lower extremity pain: Secondary | ICD-10-CM | POA: Insufficient documentation

## 2012-02-28 DIAGNOSIS — M549 Dorsalgia, unspecified: Secondary | ICD-10-CM | POA: Insufficient documentation

## 2012-02-28 NOTE — Telephone Encounter (Signed)
Noted and that is good

## 2012-02-29 ENCOUNTER — Other Ambulatory Visit (HOSPITAL_COMMUNITY): Payer: Medicare HMO

## 2012-03-12 ENCOUNTER — Ambulatory Visit: Payer: Medicare HMO | Admitting: Family Medicine

## 2012-03-15 DIAGNOSIS — R630 Anorexia: Secondary | ICD-10-CM | POA: Insufficient documentation

## 2012-03-15 DIAGNOSIS — R112 Nausea with vomiting, unspecified: Secondary | ICD-10-CM | POA: Insufficient documentation

## 2012-03-18 ENCOUNTER — Other Ambulatory Visit: Payer: Self-pay

## 2012-03-18 ENCOUNTER — Other Ambulatory Visit: Payer: Self-pay | Admitting: Family Medicine

## 2012-03-19 ENCOUNTER — Ambulatory Visit (INDEPENDENT_AMBULATORY_CARE_PROVIDER_SITE_OTHER): Payer: Medicare HMO | Admitting: Family Medicine

## 2012-03-19 ENCOUNTER — Encounter: Payer: Self-pay | Admitting: Family Medicine

## 2012-03-19 VITALS — BP 114/78 | HR 82 | Resp 16 | Ht 65.0 in | Wt 231.8 lb

## 2012-03-19 DIAGNOSIS — R5381 Other malaise: Secondary | ICD-10-CM

## 2012-03-19 DIAGNOSIS — R1011 Right upper quadrant pain: Secondary | ICD-10-CM

## 2012-03-19 DIAGNOSIS — E119 Type 2 diabetes mellitus without complications: Secondary | ICD-10-CM

## 2012-03-19 DIAGNOSIS — F411 Generalized anxiety disorder: Secondary | ICD-10-CM

## 2012-03-19 DIAGNOSIS — R5383 Other fatigue: Secondary | ICD-10-CM

## 2012-03-19 DIAGNOSIS — Z139 Encounter for screening, unspecified: Secondary | ICD-10-CM

## 2012-03-19 DIAGNOSIS — E785 Hyperlipidemia, unspecified: Secondary | ICD-10-CM

## 2012-03-19 DIAGNOSIS — E669 Obesity, unspecified: Secondary | ICD-10-CM

## 2012-03-19 MED ORDER — METFORMIN HCL 500 MG PO TABS: 500.0000 mg | ORAL_TABLET | Freq: Every day | ORAL | Status: DC

## 2012-03-19 MED ORDER — MIRTAZAPINE 15 MG PO TABS: 15.0000 mg | ORAL_TABLET | Freq: Every day | ORAL | Status: DC

## 2012-03-19 MED ORDER — ROSUVASTATIN CALCIUM 10 MG PO TABS: 10.0000 mg | ORAL_TABLET | Freq: Every day | ORAL | Status: DC

## 2012-03-19 NOTE — Patient Instructions (Addendum)
F/u in 7 weeks.  TSH and vit D levels today.  New medication sent to [facility] for sleep , anxiety and depression, will also help appetite. Do not take ambien/zolpidem if you sleep well with this.  Call if problems with the new medication please  Please start walking 10 to 15 minutes every day

## 2012-03-19 NOTE — Progress Notes (Signed)
  Subjective:    Patient ID: [name] [name], female    DOB: November 16, 1955, 56 y.o.   MRN: [id number]  HPI The PT is here for follow up and re-evaluation of chronic medical conditions, medication management and review of any available recent lab and radiology data.  Preventive health is updated, specifically  Cancer screening and Immunization.   Questions or concerns regarding consultations or procedures which the PT has had in the interim are  Addressed.Has been seen in GI and surgical clinic at [facility] for eval of abdominal pain The PT denies any adverse reactions to current medications since the last visit.  C/o worsening fatigue and poor sleep, continues to have extreme anxiety that "something is wrong ever since her cholecystectomy" Also increased anxiety in recent times due to ill health of her father , who is now recovering   Review of Systems See HPI Denies recent fever or chills. Denies sinus pressure, nasal congestion, ear pain or sore throat. Denies chest congestion, productive cough or wheezing. Denies chest pains, palpitations and leg swelling Denies vomiting,diarrhea or constipation.   Denies dysuria, frequency, hesitancy or incontinence. Denies joint pain, swelling and limitation in mobility. Denies headaches, seizures, numbness, or tingling. C/o  depression, anxiety and  insomnia. Denies skin break down or rash.        Objective:   Physical Exam Patient alert and oriented and in no cardiopulmonary distress.  HEENT: No facial asymmetry, EOMI, no sinus tenderness,  oropharynx pink and moist.  Neck supple no adenopathy.  Chest: Clear to auscultation bilaterally.  CVS: S1, S2 no murmurs, no S3.  ABD: Soft non tender. Bowel sounds normal.No palpable organomegaly or masses  Ext: No edema  MS: Adequate ROM spine, shoulders, hips and knees.  Skin: Intact, no ulcerations or rash noted.  Psych: Good eye contact, . Memory intact  anxious and  depressed appearing.  CNS:  CN 2-12 intact, power, tone and sensation normal throughout.        Assessment & Plan:

## 2012-03-20 LAB — VITAMIN D 25 HYDROXY (VIT D DEFICIENCY, FRACTURES): Vit D, 25-Hydroxy: 34 ng/mL (ref 30–89)

## 2012-03-20 LAB — TSH: TSH: 1.169 u[IU]/mL (ref 0.350–4.500)

## 2012-03-24 NOTE — Assessment & Plan Note (Signed)
Has been evaluated in the past 2 months at [facility] in surgery clinic then in GI clinic, advised to se topical med for relief of abdominal pain by GI

## 2012-03-24 NOTE — Assessment & Plan Note (Signed)
unchanged Patient re-educated about  the importance of commitment to a  minimum of 150 minutes of exercise per week. The importance of healthy food choices with portion control discussed. Encouraged to start a food diary, count calories and to consider  joining a support group. Sample diet sheets offered. Goals set by the patient for the next several months.    

## 2012-03-24 NOTE — Assessment & Plan Note (Signed)
Controlled, no change in medication  

## 2012-03-24 NOTE — Assessment & Plan Note (Signed)
Untreated and uncontrolled with de[pression and insomnia, pt to start remeron

## 2012-03-24 NOTE — Assessment & Plan Note (Signed)
C/o worsening fatigue, check thyroid and vit D levels

## 2012-04-23 ENCOUNTER — Ambulatory Visit (INDEPENDENT_AMBULATORY_CARE_PROVIDER_SITE_OTHER): Payer: Medicare HMO | Admitting: Internal Medicine

## 2012-05-07 ENCOUNTER — Ambulatory Visit (INDEPENDENT_AMBULATORY_CARE_PROVIDER_SITE_OTHER): Payer: Medicare HMO | Admitting: Family Medicine

## 2012-05-07 ENCOUNTER — Encounter: Payer: Self-pay | Admitting: Family Medicine

## 2012-05-07 ENCOUNTER — Other Ambulatory Visit: Payer: Self-pay | Admitting: Family Medicine

## 2012-05-07 VITALS — BP 140/78 | HR 70 | Resp 18 | Ht 65.0 in | Wt 215.1 lb

## 2012-05-07 DIAGNOSIS — I10 Essential (primary) hypertension: Secondary | ICD-10-CM

## 2012-05-07 DIAGNOSIS — E785 Hyperlipidemia, unspecified: Secondary | ICD-10-CM

## 2012-05-07 DIAGNOSIS — R519 Headache, unspecified: Secondary | ICD-10-CM | POA: Insufficient documentation

## 2012-05-07 DIAGNOSIS — F329 Major depressive disorder, single episode, unspecified: Secondary | ICD-10-CM

## 2012-05-07 DIAGNOSIS — E119 Type 2 diabetes mellitus without complications: Secondary | ICD-10-CM

## 2012-05-07 DIAGNOSIS — F3289 Other specified depressive episodes: Secondary | ICD-10-CM | POA: Insufficient documentation

## 2012-05-07 DIAGNOSIS — R51 Headache: Secondary | ICD-10-CM

## 2012-05-07 DIAGNOSIS — IMO0001 Reserved for inherently not codable concepts without codable children: Secondary | ICD-10-CM

## 2012-05-07 MED ORDER — LOSARTAN POTASSIUM 50 MG PO TABS: 100.0000 mg | ORAL_TABLET | Freq: Every day | ORAL | Status: DC

## 2012-05-07 MED ORDER — FLUOXETINE HCL 10 MG PO CAPS: 10.0000 mg | ORAL_CAPSULE | Freq: Every day | ORAL | Status: DC

## 2012-05-07 NOTE — Patient Instructions (Addendum)
F/u in 2 month  New medication starting today for depression, prozac, which you have used in the past.Based on your score this will help alot  I recommend you make an appointment for an eye exam since you report pain, blurry vision in your right eye.  You are referred to the headache clinic about headaches.   HBA1C cmp, lipid  and EGFR today

## 2012-05-07 NOTE — Assessment & Plan Note (Addendum)
Uncontrolled daily headache, refer to clinic

## 2012-05-07 NOTE — Progress Notes (Signed)
  Subjective:    Patient ID: [name] [name], female    DOB: 03/01/1956, 56 y.o.   MRN: [id number]  HPI Nagging drawing pain on right side of head for the past 2 weeks also reports pressure in right eye and blurry vision in the right eye intermittently  Since this started, Saw opthalmology last October. Has used hydrocodone she got from ortho for the headache this allowed her to sleep Continues to experience disabling anxiety with underlying depression, willing to use fluoxetine which she has used in the past. Not suicidal or homicidal, no hallucinations   Review of Systems See HPI Denies recent fever or chills. Denies sinus pressure, nasal congestion, ear pain or sore throat. Denies chest congestion, productive cough or wheezing. Denies chest pains, palpitations and leg swelling Chronic abdominal pain,denies , nausea, vomiting,diarrhea or constipation.   Denies dysuria, frequency, hesitancy or incontinence. Denies joint pain, swelling and limitation in mobility. Denies  seizures, numbness, or tingling.  Denies skin break down or rash.        Objective:   Physical Exam  Patient alert and oriented and in no cardiopulmonary distress.  HEENT: No facial asymmetry, EOMI, no sinus tenderness,  oropharynx pink and moist.  Neck supple no adenopathy.  Chest: Clear to auscultation bilaterally.  CVS: S1, S2 no murmurs, no S3.  ABD: Soft non tender. Bowel sounds normal.  Ext: No edema  MS: Adequate ROM spine, shoulders, hips and knees.  Skin: Intact, no ulcerations or rash noted.  Psych: Good eye contact,  anxious  And mildly  depressed appearing.  CNS: CN 2-12 intact, power, tone and sensation normal throughout.       Assessment & Plan:

## 2012-05-08 LAB — COMPLETE METABOLIC PANEL WITH GFR
Albumin: 4.4 g/dL (ref 3.5–5.2)
Alkaline Phosphatase: 133 U/L — ABNORMAL HIGH (ref 39–117)
CO2: 28 mEq/L (ref 19–32)
GFR, Est Non African American: 71 mL/min
Glucose, Bld: 89 mg/dL (ref 70–99)
Potassium: 4.7 mEq/L (ref 3.5–5.3)
Sodium: 139 mEq/L (ref 135–145)
Total Protein: 7.6 g/dL (ref 6.0–8.3)

## 2012-05-08 LAB — LIPID PANEL: Total CHOL/HDL Ratio: 2.8 Ratio

## 2012-05-08 LAB — HEMOGLOBIN A1C
Hgb A1c MFr Bld: 6.3 % — ABNORMAL HIGH (ref <5.7)
Mean Plasma Glucose: 134 mg/dL — ABNORMAL HIGH (ref <117)

## 2012-05-17 ENCOUNTER — Emergency Department (HOSPITAL_COMMUNITY)
Admission: EM | Admit: 2012-05-17 | Discharge: 2012-05-17 | Disposition: A | Discharge status: Home / Self Care | Payer: Medicare HMO | Attending: Emergency Medicine | Admitting: Emergency Medicine

## 2012-05-17 ENCOUNTER — Emergency Department (HOSPITAL_COMMUNITY): Payer: Medicare HMO

## 2012-05-17 ENCOUNTER — Encounter (HOSPITAL_COMMUNITY): Payer: Self-pay | Admitting: *Deleted

## 2012-05-17 DIAGNOSIS — F411 Generalized anxiety disorder: Secondary | ICD-10-CM | POA: Insufficient documentation

## 2012-05-17 DIAGNOSIS — R079 Chest pain, unspecified: Secondary | ICD-10-CM | POA: Insufficient documentation

## 2012-05-17 DIAGNOSIS — F329 Major depressive disorder, single episode, unspecified: Secondary | ICD-10-CM | POA: Insufficient documentation

## 2012-05-17 DIAGNOSIS — K219 Gastro-esophageal reflux disease without esophagitis: Secondary | ICD-10-CM | POA: Insufficient documentation

## 2012-05-17 DIAGNOSIS — R5381 Other malaise: Secondary | ICD-10-CM | POA: Insufficient documentation

## 2012-05-17 DIAGNOSIS — E782 Mixed hyperlipidemia: Secondary | ICD-10-CM | POA: Insufficient documentation

## 2012-05-17 DIAGNOSIS — F3289 Other specified depressive episodes: Secondary | ICD-10-CM | POA: Insufficient documentation

## 2012-05-17 DIAGNOSIS — E119 Type 2 diabetes mellitus without complications: Secondary | ICD-10-CM | POA: Insufficient documentation

## 2012-05-17 DIAGNOSIS — I10 Essential (primary) hypertension: Secondary | ICD-10-CM | POA: Insufficient documentation

## 2012-05-17 DIAGNOSIS — R209 Unspecified disturbances of skin sensation: Secondary | ICD-10-CM | POA: Insufficient documentation

## 2012-05-17 DIAGNOSIS — R5383 Other fatigue: Secondary | ICD-10-CM | POA: Insufficient documentation

## 2012-05-17 LAB — COMPREHENSIVE METABOLIC PANEL
Alkaline Phosphatase: 145 U/L — ABNORMAL HIGH (ref 39–117)
BUN: 11 mg/dL (ref 6–23)
Chloride: 98 mEq/L (ref 96–112)
GFR calc Af Amer: 78 mL/min — ABNORMAL LOW (ref >90)
Glucose, Bld: 99 mg/dL (ref 70–99)
Potassium: 4.1 mEq/L (ref 3.5–5.1)
Total Bilirubin: 0.4 mg/dL (ref 0.3–1.2)

## 2012-05-17 LAB — CBC WITH DIFFERENTIAL/PLATELET
Eosinophils Absolute: 0.2 10*3/uL (ref 0.0–0.7)
HCT: 36.2 % (ref 36.0–46.0)
Hemoglobin: 11.3 g/dL — ABNORMAL LOW (ref 12.0–15.0)
Lymphs Abs: 1.8 10*3/uL (ref 0.7–4.0)
MCH: 26.1 pg (ref 26.0–34.0)
Monocytes Relative: 7 % (ref 3–12)
Neutro Abs: 5.8 10*3/uL (ref 1.7–7.7)
Neutrophils Relative %: 68 % (ref 43–77)
RBC: 4.33 MIL/uL (ref 3.87–5.11)

## 2012-05-17 LAB — TROPONIN I: Troponin I: <0.3 ng/mL (ref <0.30)

## 2012-05-17 MED ORDER — SODIUM CHLORIDE 0.9 % IV BOLUS (SEPSIS)
500.0000 mL | Freq: Once | INTRAVENOUS | Status: AC
  Administered 2012-05-17: 500 mL via INTRAVENOUS

## 2012-05-17 NOTE — ED Notes (Signed)
Pt presents with left chest pain/tightness that began about 1430 while pt was at rest. Pt reports has left leg tingling at this time also. C/o SOB at times. Denies N/V. No acute distress noted at this time. Pt placed on cardiac monitor, EKG completed, PIV placed on first attempt and Labs drawn; sent to lab. Side rails up per safety, pt is alert, orientated x 4.  Family sitting at bedside.

## 2012-05-17 NOTE — ED Notes (Signed)
Chest tightness and tingling in lt leg, onset  230p.

## 2012-05-17 NOTE — ED Provider Notes (Signed)
History   This chart was scribed for [name] Lennert, MD by Melba Coon. The patient was seen in room APA15/APA15 and the patient's care was started at 4:11PM.    CSN: [id number]  Arrival date & time 05/17/12  1551   First MD Initiated Contact with Patient 05/17/12 1606      Chief Complaint  Patient presents with  . Chest Pain    (Consider location/radiation/quality/duration/timing/severity/associated sxs/prior treatment) Patient is a 56 y.o. female presenting with chest pain. The history is provided by the patient. No language interpreter was used.  Chest Pain The chest pain began 1 - 2 hours ago. Chest pain occurs constantly. The chest pain is unchanged. The pain is associated with breathing. The severity of the pain is moderate. The quality of the pain is described as pressure-like. The pain does not radiate. Primary symptoms include shortness of breath. Pertinent negatives for primary symptoms include no fever, no cough, no abdominal pain, no vomiting and no dizziness.  The shortness of breath began today. The shortness of breath developed suddenly. The shortness of breath is mild.  Associated symptoms include numbness (left leg and arm) and weakness. She tried nothing for the symptoms.  Her past medical history is significant for anxiety/panic attacks and hyperlipidemia. Procedure history comments: walked around with heart monitor for a month.   Pt states that the "squeezing" CP started suddenly, but states that the SOB has alleviated some since onset. Pt also complains of numbness and tingling in the left leg and arm, left leg cramping (not pain), and generalized weakness. Allergic to contrast media; Lisinopril; and Buspar. No other pertinent medical symptoms.  PCP: Dr. Lodema Hong  Past Medical History  Diagnosis Date  . Anxiety   . Depression   . GERD (gastroesophageal reflux disease)   . Mixed hyperlipidemia   . Essential hypertension, benign   . Low back pain   .  Osteoarthritis   . Constipation   . Varicose veins   . Positive H. pylori test   . Glaucoma     Last eye exam 9/08  . Hiatal hernia   . Schatzki's ring   . Diabetes mellitus, type 2   . TIA (transient ischemic attack)     6/10  . Iron deficiency anemia   . Chronic abdominal pain     Past Surgical History  Procedure Date  . Lumbar discetomy 10/01    Dr. Fabiola Backer   . Tubal ligation 1994  . Cholecystectomy Dec 26, 2010  . Umbilical hernia repair 12/26/2010  . Spine surgery   . Colonoscopy 01/2006  . Esophagogastroduodenoscopy     Esophageal dilation 2007    Family History  Problem Relation Age of Onset  . Hypertension Father   . Diabetes Father   . Diabetes Mother   . Arrhythmia Daughter     Defibrillator   . Scoliosis Daughter   . Healthy Sister   . Heart disease Daughter   . Healthy Daughter     History  Substance Use Topics  . Smoking status: Never Smoker   . Smokeless tobacco: Never Used  . Alcohol Use: No    OB History    Grav Para Term Preterm Abortions TAB SAB Ect Mult Living                  Review of Systems  Constitutional: Negative for fever.       10 Systems reviewed and are negative for acute change except as noted in the HPI.  HENT: Negative for sore throat and rhinorrhea.   Eyes: Negative for discharge and redness.  Respiratory: Positive for shortness of breath. Negative for cough.   Cardiovascular: Positive for chest pain.  Gastrointestinal: Negative for vomiting, abdominal pain and diarrhea.  Genitourinary: Negative for dysuria.  Musculoskeletal: Negative for back pain.  Skin: Negative for rash.  Neurological: Positive for weakness and numbness (left leg and arm). Negative for dizziness, syncope, speech difficulty and headaches.  Psychiatric/Behavioral: Negative for suicidal ideas, hallucinations and confusion.    Allergies  Contrast media; Lisinopril; and Buspar  Home Medications   Current Outpatient Rx  Name Route Sig Dispense  Refill  . AMLODIPINE BESYLATE 10 MG PO TABS Oral Take 10 mg by mouth daily.    . ASPIRIN 325 MG PO TABS Oral Take 325 mg by mouth daily.      . COD LIVER OIL W/VIT A & D PO CAPS Oral Take 1 capsule by mouth daily.      Marland Kitchen FLUOXETINE HCL 10 MG PO CAPS Oral Take 1 capsule (10 mg total) by mouth daily. 30 capsule 2  . FUROSEMIDE 40 MG PO TABS  TAKE 1 TABLET DAILY 90 tablet 1  . GABAPENTIN 100 MG PO CAPS Oral Take 100 mg by mouth 2 (two) times daily as needed. For numbness, tingling or burning sensation    . HYDROCODONE-ACETAMINOPHEN 5-500 MG PO TABS Oral Take 1 tablet by mouth every 4 (four) hours as needed. For pain    . LOSARTAN POTASSIUM 50 MG PO TABS Oral Take 2 tablets (100 mg total) by mouth daily. 30 tablet 3  . METFORMIN HCL 500 MG PO TABS Oral Take 1 tablet (500 mg total) by mouth daily with breakfast. 90 tablet 1  . NABUMETONE 750 MG PO TABS Oral Take 750 mg by mouth daily as needed. For pain/inflammation    . OMEPRAZOLE 20 MG PO CPDR  TAKE 1 CAPSULE TWICE DAILY ( DOSE INCREASE ) 180 capsule 2  . POLYETHYLENE GLYCOL 3350 PO POWD Oral Take 17 g by mouth daily.     Marland Kitchen ROSUVASTATIN CALCIUM 10 MG PO TABS Oral Take 1 tablet (10 mg total) by mouth daily. 90 tablet 1  . SPIRONOLACTONE 25 MG PO TABS Oral Take 25 mg by mouth daily.    Marland Kitchen ZOLPIDEM TARTRATE 10 MG PO TABS Oral Take 10 mg by mouth at bedtime as needed. For insomnia      BP 116/62  Pulse 74  Temp 98.2 F (36.8 C) (Oral)  Resp 16  Ht 5\' 5"  (1.651 m)  Wt 215 lb (97.523 kg)  BMI 35.78 kg/m2  SpO2 98%  Physical Exam  Nursing note and vitals reviewed. Constitutional: She is oriented to person, place, and time. She appears well-developed and well-nourished. No distress.  HENT:  Head: Normocephalic and atraumatic.  Right Ear: External ear normal.  Left Ear: External ear normal.  Eyes: EOM are normal.  Neck: Normal range of motion. No tracheal deviation present.  Cardiovascular: Normal rate, regular rhythm and normal heart sounds.    No murmur heard. Pulmonary/Chest: Effort normal and breath sounds normal. No respiratory distress.  Abdominal: Soft. There is no tenderness.  Musculoskeletal: Normal range of motion. She exhibits no edema and no tenderness.  Neurological: She is alert and oriented to person, place, and time.  Skin: Skin is warm and dry. No rash noted.  Psychiatric: She has a normal mood and affect. Her behavior is normal.    ED Course  Procedures (including critical care  time)  DIAGNOSTIC STUDIES: Oxygen Saturation is 98% on room air, normal by my interpretation.    COORDINATION OF CARE:  4:15PM - IV fluids, CXR, and blood w/u will be ordered for the pt. 6:16PM - lab and imaging results reviewed; results are negative; pt's symptoms are improved; pt ready to be d/c; should f/u with PCP.   Labs Reviewed  CBC WITH DIFFERENTIAL - Abnormal; Notable for the following:    Hemoglobin 11.3 (*)     All other components within normal limits  COMPREHENSIVE METABOLIC PANEL - Abnormal; Notable for the following:    Alkaline Phosphatase 145 (*)     GFR calc non Af Amer 67 (*)     GFR calc Af Amer 78 (*)     All other components within normal limits  TROPONIN I  D-DIMER, QUANTITATIVE   Dg Chest 2 View  05/17/2012  *RADIOLOGY REPORT*  Clinical Data: Chest pain, weakness  CHEST - 2 VIEW  Comparison: Chest x-ray of 01/25/2012  Findings: No active infiltrate or effusion is seen.  Mediastinal contours appear stable.  The heart is within normal limits in size. There are diffuse degenerative changes throughout the thoracic spine.  IMPRESSION: Stable chest x-ray.  No active lung disease.  Original Report Authenticated By: Juline Patch, M.D.     No diagnosis found.   Date: 05/17/2012  Rate:82  Rhythm: normal sinus rhythm  QRS Axis: normal  Intervals: normal  ST/T Wave abnormalities: nonspecific ST changes and nonspecific T wave changes  Conduction Disutrbances:none  Narrative Interpretation:   Old EKG  Reviewed: none available    MDM    The chart was scribed for me under my direct supervision.  I personally performed the history, physical, and medical decision making and all procedures in the evaluation of this patient.[name] Lennert, MD 05/17/12 [phone]

## 2012-05-23 ENCOUNTER — Encounter: Payer: Self-pay | Admitting: Family Medicine

## 2012-05-23 ENCOUNTER — Ambulatory Visit (HOSPITAL_COMMUNITY): Payer: Medicare HMO

## 2012-05-23 ENCOUNTER — Ambulatory Visit (INDEPENDENT_AMBULATORY_CARE_PROVIDER_SITE_OTHER): Payer: Medicare HMO | Admitting: Family Medicine

## 2012-05-23 VITALS — BP 118/70 | HR 81 | Resp 18 | Ht 65.0 in | Wt 223.1 lb

## 2012-05-23 DIAGNOSIS — G4700 Insomnia, unspecified: Secondary | ICD-10-CM

## 2012-05-23 DIAGNOSIS — R7303 Prediabetes: Secondary | ICD-10-CM

## 2012-05-23 DIAGNOSIS — I10 Essential (primary) hypertension: Secondary | ICD-10-CM

## 2012-05-23 DIAGNOSIS — F3289 Other specified depressive episodes: Secondary | ICD-10-CM

## 2012-05-23 DIAGNOSIS — R7309 Other abnormal glucose: Secondary | ICD-10-CM

## 2012-05-23 DIAGNOSIS — F329 Major depressive disorder, single episode, unspecified: Secondary | ICD-10-CM

## 2012-05-23 DIAGNOSIS — N950 Postmenopausal bleeding: Secondary | ICD-10-CM

## 2012-05-23 DIAGNOSIS — F411 Generalized anxiety disorder: Secondary | ICD-10-CM

## 2012-05-23 MED ORDER — ZOLPIDEM TARTRATE 5 MG PO TABS: 5.0000 mg | ORAL_TABLET | Freq: Every evening | ORAL | Status: DC | PRN

## 2012-05-23 NOTE — Patient Instructions (Addendum)
F/u as before.  No other changes in medication apart from those discussed.   Stop fluoxetine, since it makes you feel funny  Reduced dose of zolpidem to  5 mg

## 2012-05-23 NOTE — Progress Notes (Signed)
  Subjective:    Patient ID: [name] [name], female    DOB: 1956/02/06, 56 y.o.   MRN: [id number]  HPI Pt in for f/u recent ED visit where she presented with a c/o chest pain. At that time she was treated for dehydration and no evidence of ACS was found. States she made an appt to see gyne for episode of post menopausal bleed while in the [place] office with her daughter. States she has stopped fluoxetine since it "made her feel funny" Does not need as [place] a dose of zolpidem as prescribed so dose will be reduced    Review of Systems See HPI Denies recent fever or chills. Denies sinus pressure, nasal congestion, ear pain or sore throat. Denies chest congestion, productive cough or wheezing. Denies chest pains, palpitations and leg swelling Chronic abdominal pain essentially unchanged.   Denies dysuria, frequency, hesitancy or incontinence. Denies joint pain, swelling and limitation in mobility. Denies headaches, seizures, numbness, or tingling. Denies depression,has chronic  anxiety and  insomnia. Denies skin break down or rash.        Objective:   Physical Exam Patient alert and oriented and in no cardiopulmonary distress.  HEENT: No facial asymmetry, EOMI, no sinus tenderness,  oropharynx pink and moist.  Neck supple no adenopathy.  Chest: Clear to auscultation bilaterally.  CVS: S1, S2 no murmurs, no S3.  ABD: Soft non tender. Bowel sounds normal.  Ext: No edema  MS: Adequate ROM spine, shoulders, hips and knees.  Skin: Intact, no ulcerations or rash noted.  Psych: Good eye contact, normal affect. Memory intact not anxious or depressed appearing.  CNS: CN 2-12 intact, power, tone and sensation normal throughout.        Assessment & Plan:

## 2012-05-25 NOTE — Assessment & Plan Note (Signed)
Uncontrolled, with a lot of anxiety and chronic somatic complaints which are multiple. Has been on fluoxetine in the past, will start same again

## 2012-05-25 NOTE — Assessment & Plan Note (Signed)
Patient educated about the importance of limiting  Carbohydrate intake , the need to commit to daily physical activity for a minimum of 30 minutes , and to commit weight loss. The fact that changes in all these areas will reduce or eliminate all together the development of diabetes is stressed.   No med change

## 2012-05-25 NOTE — Assessment & Plan Note (Signed)
Controlled, no change in medication DASH diet and commitment to daily physical activity for a minimum of 30 minutes discussed and encouraged, as a part of hypertension management. The importance of attaining a healthy weight is also discussed.  

## 2012-05-29 ENCOUNTER — Other Ambulatory Visit: Payer: Self-pay

## 2012-05-29 MED ORDER — LOSARTAN POTASSIUM 50 MG PO TABS: 100.0000 mg | ORAL_TABLET | ORAL | Status: DC

## 2012-05-31 ENCOUNTER — Ambulatory Visit (HOSPITAL_COMMUNITY)
Admission: RE | Admit: 2012-05-31 | Discharge: 2012-05-31 | Disposition: A | Discharge status: Home / Self Care | Payer: Medicare HMO | Source: Ambulatory Visit | Attending: Family Medicine | Admitting: Family Medicine

## 2012-05-31 DIAGNOSIS — IMO0001 Reserved for inherently not codable concepts without codable children: Secondary | ICD-10-CM

## 2012-05-31 DIAGNOSIS — Z1231 Encounter for screening mammogram for malignant neoplasm of breast: Secondary | ICD-10-CM | POA: Insufficient documentation

## 2012-05-31 NOTE — Assessment & Plan Note (Signed)
Controlled, no change in medication DASH diet and commitment to daily physical activity for a minimum of 30 minutes discussed and encouraged, as a part of hypertension management. The importance of attaining a healthy weight is also discussed.  

## 2012-05-31 NOTE — Assessment & Plan Note (Addendum)
Improved, continue current management 

## 2012-05-31 NOTE — Assessment & Plan Note (Signed)
Unchanged intolerant of med prescribed

## 2012-05-31 NOTE — Assessment & Plan Note (Signed)
Pt would benefit from therapy , no interest at this   Time, non compliant with med,.dpoes "not tolerate"Not suicidal or homicidal

## 2012-06-05 ENCOUNTER — Ambulatory Visit: Payer: Medicare HMO | Admitting: Family Medicine

## 2012-06-05 ENCOUNTER — Other Ambulatory Visit: Payer: Self-pay | Admitting: Obstetrics and Gynecology

## 2012-06-05 ENCOUNTER — Telehealth: Payer: Self-pay | Admitting: Family Medicine

## 2012-06-06 ENCOUNTER — Encounter (HOSPITAL_COMMUNITY): Payer: Self-pay | Admitting: Pharmacist

## 2012-06-10 ENCOUNTER — Encounter (HOSPITAL_COMMUNITY): Payer: Self-pay

## 2012-06-10 ENCOUNTER — Encounter (HOSPITAL_COMMUNITY)
Admission: RE | Admit: 2012-06-10 | Discharge: 2012-06-10 | Disposition: A | Discharge status: Home / Self Care | Payer: Medicare HMO | Source: Ambulatory Visit | Attending: Obstetrics and Gynecology | Admitting: Obstetrics and Gynecology

## 2012-06-10 LAB — BASIC METABOLIC PANEL
Calcium: 9.7 mg/dL (ref 8.4–10.5)
GFR calc non Af Amer: 74 mL/min — ABNORMAL LOW (ref >90)
Glucose, Bld: 87 mg/dL (ref 70–99)
Sodium: 138 mEq/L (ref 135–145)

## 2012-06-10 LAB — CBC
MCH: 26.5 pg (ref 26.0–34.0)
MCHC: 31 g/dL (ref 30.0–36.0)
Platelets: 274 10*3/uL (ref 150–400)

## 2012-06-10 NOTE — Pre-Procedure Instructions (Signed)
Patient history and EKG reviewed by Gloriajean Dell, no orders given.

## 2012-06-10 NOTE — Patient Instructions (Addendum)
20 [name] [name]  06/10/2012   Your procedure is scheduled on:  06/11/12  Enter through the Main Entrance of [facility] at 10 AM.  Pick up the phone at the desk and dial [phone].   Call this number if you have problems the morning of surgery: [phone]   Remember:   Do not eat food:After Midnight.  Do not drink clear liquids: After Midnight.  Take these medicines the morning of surgery with A SIP OF WATER: all blood pressure medications except Lasix. Take Prilosec, do not take Metformin   Do not wear jewelry, make-up or nail polish.  Do not wear lotions, powders, or perfumes. You may wear deodorant.  Do not shave 48 hours prior to surgery.  Do not bring valuables to the hospital.  Contacts, dentures or bridgework may not be worn into surgery.  Leave suitcase in the car. After surgery it may be brought to your room.  For patients admitted to the hospital, checkout time is 11:00 AM the day of discharge.   Patients discharged the day of surgery will not be allowed to drive home.  Name and phone number of your driver: unsure  Cousin?  Special Instructions: CHG Shower Use Special Wash: 1/2 bottle night before surgery and 1/2 bottle morning of surgery.   Please read over the following fact sheets that you were given: Surgical Site Infection Prevention

## 2012-06-11 ENCOUNTER — Ambulatory Visit (HOSPITAL_COMMUNITY)
Admission: RE | Admit: 2012-06-11 | Discharge: 2012-06-11 | Disposition: A | Discharge status: Home / Self Care | Payer: Medicare HMO | Source: Ambulatory Visit | Attending: Obstetrics and Gynecology | Admitting: Obstetrics and Gynecology

## 2012-06-11 ENCOUNTER — Encounter (HOSPITAL_COMMUNITY)
Admission: RE | Disposition: A | Discharge status: Home / Self Care | Payer: Self-pay | Source: Ambulatory Visit | Attending: Obstetrics and Gynecology

## 2012-06-11 ENCOUNTER — Encounter (HOSPITAL_COMMUNITY): Payer: Self-pay | Admitting: Anesthesiology

## 2012-06-11 ENCOUNTER — Ambulatory Visit (HOSPITAL_COMMUNITY): Payer: Medicare HMO | Admitting: Anesthesiology

## 2012-06-11 ENCOUNTER — Encounter (HOSPITAL_COMMUNITY): Payer: Self-pay | Admitting: *Deleted

## 2012-06-11 DIAGNOSIS — R9389 Abnormal findings on diagnostic imaging of other specified body structures: Secondary | ICD-10-CM | POA: Insufficient documentation

## 2012-06-11 DIAGNOSIS — N840 Polyp of corpus uteri: Secondary | ICD-10-CM | POA: Insufficient documentation

## 2012-06-11 DIAGNOSIS — Z01812 Encounter for preprocedural laboratory examination: Secondary | ICD-10-CM | POA: Insufficient documentation

## 2012-06-11 DIAGNOSIS — N950 Postmenopausal bleeding: Secondary | ICD-10-CM | POA: Insufficient documentation

## 2012-06-11 DIAGNOSIS — Z01818 Encounter for other preprocedural examination: Secondary | ICD-10-CM | POA: Insufficient documentation

## 2012-06-11 LAB — GLUCOSE, CAPILLARY
Glucose-Capillary: 115 mg/dL — ABNORMAL HIGH (ref 70–99)
Glucose-Capillary: 99 mg/dL (ref 70–99)

## 2012-06-11 SURGERY — DILATATION & CURETTAGE/HYSTEROSCOPY WITH VERSAPOINT RESECTION
Anesthesia: General | Site: Vagina | Wound class: Clean Contaminated

## 2012-06-11 MED ORDER — GLYCOPYRROLATE 0.2 MG/ML IJ SOLN
INTRAMUSCULAR | Status: DC | PRN
  Administered 2012-06-11: 0.2 mg via INTRAVENOUS

## 2012-06-11 MED ORDER — ROCURONIUM BROMIDE 100 MG/10ML IV SOLN
INTRAVENOUS | Status: DC | PRN
  Administered 2012-06-11: 5 mg via INTRAVENOUS

## 2012-06-11 MED ORDER — CHLOROPROCAINE HCL 1 % IJ SOLN
INTRAMUSCULAR | Status: DC | PRN
  Administered 2012-06-11: 10 mL

## 2012-06-11 MED ORDER — MIDAZOLAM HCL 2 MG/2ML IJ SOLN
INTRAMUSCULAR | Status: AC
  Filled 2012-06-11: qty 2

## 2012-06-11 MED ORDER — KETOROLAC TROMETHAMINE 30 MG/ML IJ SOLN
INTRAMUSCULAR | Status: DC | PRN
  Administered 2012-06-11: 30 mg via INTRAVENOUS
  Administered 2012-06-11: 30 mg via INTRAMUSCULAR

## 2012-06-11 MED ORDER — SODIUM CHLORIDE 0.9 % IR SOLN
Status: DC | PRN
  Administered 2012-06-11: 3000 mL

## 2012-06-11 MED ORDER — KETOROLAC TROMETHAMINE 60 MG/2ML IM SOLN
INTRAMUSCULAR | Status: AC
  Filled 2012-06-11: qty 2

## 2012-06-11 MED ORDER — FENTANYL CITRATE 0.05 MG/ML IJ SOLN
INTRAMUSCULAR | Status: AC
  Filled 2012-06-11: qty 5

## 2012-06-11 MED ORDER — FENTANYL CITRATE 0.05 MG/ML IJ SOLN
INTRAMUSCULAR | Status: AC
  Administered 2012-06-11: 50 ug via INTRAVENOUS
  Filled 2012-06-11: qty 2

## 2012-06-11 MED ORDER — PROPOFOL 10 MG/ML IV EMUL
INTRAVENOUS | Status: AC
  Filled 2012-06-11: qty 20

## 2012-06-11 MED ORDER — PHENYLEPHRINE HCL 10 MG/ML IJ SOLN
INTRAMUSCULAR | Status: DC | PRN
  Administered 2012-06-11 (×2): 80 mg via INTRAVENOUS

## 2012-06-11 MED ORDER — FENTANYL CITRATE 0.05 MG/ML IJ SOLN
INTRAMUSCULAR | Status: DC | PRN
  Administered 2012-06-11: 100 ug via INTRAVENOUS

## 2012-06-11 MED ORDER — ONDANSETRON HCL 4 MG/2ML IJ SOLN
INTRAMUSCULAR | Status: AC
  Filled 2012-06-11: qty 2

## 2012-06-11 MED ORDER — LACTATED RINGERS IV SOLN
INTRAVENOUS | Status: DC
  Administered 2012-06-11: 10:00:00 via INTRAVENOUS

## 2012-06-11 MED ORDER — CHLOROPROCAINE HCL 1 % IJ SOLN
INTRAMUSCULAR | Status: AC
  Filled 2012-06-11: qty 30

## 2012-06-11 MED ORDER — EPHEDRINE SULFATE 50 MG/ML IJ SOLN
INTRAMUSCULAR | Status: DC | PRN
  Administered 2012-06-11: 10 mg via INTRAVENOUS

## 2012-06-11 MED ORDER — ONDANSETRON HCL 4 MG/2ML IJ SOLN
INTRAMUSCULAR | Status: DC | PRN
  Administered 2012-06-11: 4 mg via INTRAVENOUS

## 2012-06-11 MED ORDER — PHENYLEPHRINE 40 MCG/ML (10ML) SYRINGE FOR IV PUSH (FOR BLOOD PRESSURE SUPPORT)
PREFILLED_SYRINGE | INTRAVENOUS | Status: AC
  Filled 2012-06-11: qty 5

## 2012-06-11 MED ORDER — ROCURONIUM BROMIDE 50 MG/5ML IV SOLN
INTRAVENOUS | Status: AC
  Filled 2012-06-11: qty 1

## 2012-06-11 MED ORDER — SUCCINYLCHOLINE CHLORIDE 20 MG/ML IJ SOLN
INTRAMUSCULAR | Status: DC | PRN
  Administered 2012-06-11: 120 mg via INTRAVENOUS

## 2012-06-11 MED ORDER — MIDAZOLAM HCL 5 MG/5ML IJ SOLN
INTRAMUSCULAR | Status: DC | PRN
  Administered 2012-06-11 (×2): 1 mg via INTRAVENOUS

## 2012-06-11 MED ORDER — FENTANYL CITRATE 0.05 MG/ML IJ SOLN
25.0000 ug | INTRAMUSCULAR | Status: DC | PRN
  Administered 2012-06-11: 50 ug via INTRAVENOUS

## 2012-06-11 MED ORDER — SUCCINYLCHOLINE CHLORIDE 20 MG/ML IJ SOLN
INTRAMUSCULAR | Status: AC
  Filled 2012-06-11: qty 10

## 2012-06-11 MED ORDER — KETOROLAC TROMETHAMINE 30 MG/ML IJ SOLN: 15.0000 mg | Freq: Once | INTRAMUSCULAR | Status: DC | PRN

## 2012-06-11 MED ORDER — LIDOCAINE HCL (CARDIAC) 20 MG/ML IV SOLN
INTRAVENOUS | Status: DC | PRN
  Administered 2012-06-11: 50 mg via INTRAVENOUS

## 2012-06-11 MED ORDER — EPHEDRINE 5 MG/ML INJ
INTRAVENOUS | Status: AC
  Filled 2012-06-11: qty 10

## 2012-06-11 MED ORDER — LIDOCAINE HCL (CARDIAC) 20 MG/ML IV SOLN
INTRAVENOUS | Status: AC
  Filled 2012-06-11: qty 5

## 2012-06-11 MED ORDER — PROPOFOL 10 MG/ML IV EMUL
INTRAVENOUS | Status: DC | PRN
  Administered 2012-06-11: 150 mg via INTRAVENOUS

## 2012-06-11 SURGICAL SUPPLY — 17 items
CANISTER SUCTION 2500CC (MISCELLANEOUS) ×2 IMPLANT
CATH ROBINSON RED A/P 16FR (CATHETERS) ×2 IMPLANT
CLOTH BEACON ORANGE TIMEOUT ST (SAFETY) ×2 IMPLANT
CONTAINER PREFILL 10% NBF 60ML (FORM) ×4 IMPLANT
ELECT REM PT RETURN 9FT ADLT (ELECTROSURGICAL) ×2
ELECTRODE REM PT RTRN 9FT ADLT (ELECTROSURGICAL) ×1 IMPLANT
ELECTRODE ROLLER VERSAPOINT (ELECTRODE) IMPLANT
ELECTRODE RT ANGLE VERSAPOINT (CUTTING LOOP) ×1 IMPLANT
GLOVE BIO SURGEON STRL SZ 6.5 (GLOVE) ×2 IMPLANT
GLOVE BIOGEL PI IND STRL 7.0 (GLOVE) ×2 IMPLANT
GLOVE BIOGEL PI INDICATOR 7.0 (GLOVE) ×2
GOWN PREVENTION PLUS LG XLONG (DISPOSABLE) ×4 IMPLANT
GOWN STRL REIN XL XLG (GOWN DISPOSABLE) ×2 IMPLANT
LOOP ANGLED CUTTING 22FR (CUTTING LOOP) IMPLANT
PACK HYSTEROSCOPY LF (CUSTOM PROCEDURE TRAY) ×2 IMPLANT
TOWEL OR 17X24 6PK STRL BLUE (TOWEL DISPOSABLE) ×4 IMPLANT
WATER STERILE IRR 1000ML POUR (IV SOLUTION) ×2 IMPLANT

## 2012-06-11 NOTE — Brief Op Note (Signed)
06/11/2012  12:28 PM  PATIENT:  [name] [name]  56 y.o. female  PRE-OPERATIVE DIAGNOSIS:  Postmenopausal Bleeding, Endometrial Masses  POST-OPERATIVE DIAGNOSIS:  Postmenopausal Bleeding, Endometrial Masses  PROCEDURE:  Procedure(s) (LRB): DILATATION & CURETTAGE/DIAGNOSTIC HYSTEROSCOPY WITH HYSTEROSCOPIC VERSAPOINT RESECTION (N/A) OF ENDOMETRIAL MASS  SURGEON:  Surgeon(s) and Role:    * Saniyyah Elster Cathie Beams, MD - Primary  PHYSICIAN ASSISTANT:   ASSISTANTS: none   ANESTHESIA:   general and paracervical block FINDINGS: LARGE RIGHT LATERAL ENDOMETRIAL POLYPOID MASS, CALCIFIED APPEARANCE AND THICKENING OF ENDOMETRIUM, RV UTERUS EBL:  Total I/O In: 1100 [I.V.:1100] Out: -   BLOOD ADMINISTERED:none  DRAINS: none   LOCAL MEDICATIONS USED:  OTHER NESICAINE  SPECIMEN:  Source of Specimen:  ENDOM POLYP, [facility]  DISPOSITION OF SPECIMEN:  PATHOLOGY  COUNTS:  YES  TOURNIQUET:  * No tourniquets in log *  DICTATION: .Other Dictation: Dictation Number   PLAN OF CARE: Discharge to home after PACU  PATIENT DISPOSITION:  PACU - hemodynamically stable.   Delay start of Pharmacological VTE agent (>24hrs) due to surgical blood loss or risk of bleeding: no

## 2012-06-11 NOTE — Anesthesia Postprocedure Evaluation (Signed)
Anesthesia Post Note  Patient: [name] [name]  Procedure(s) Performed: Procedure(s) (LRB): DILATATION & CURETTAGE/HYSTEROSCOPY WITH VERSAPOINT RESECTION (N/A)  Anesthesia type: GA  Patient location: PACU  Post pain: Pain level controlled  Post assessment: Post-op Vital signs reviewed  Last Vitals:  Filed Vitals:   06/11/12 1245  BP: 111/66  Pulse: 72  Temp:   Resp: 17    Post vital signs: Reviewed  Level of consciousness: sedated  Complications: No apparent anesthesia complications

## 2012-06-11 NOTE — Transfer of Care (Signed)
Immediate Anesthesia Transfer of Care Note  Patient: [name] [name]  Procedure(s) Performed: Procedure(s) (LRB): DILATATION & CURETTAGE/HYSTEROSCOPY WITH VERSAPOINT RESECTION (N/A)  Patient Location: PACU  Anesthesia Type: General  Level of Consciousness: awake, alert  and oriented  Airway & Oxygen Therapy: Patient Spontanous Breathing and Patient connected to nasal cannula oxygen  Post-op Assessment: Report given to PACU RN and Post -op Vital signs reviewed and stable  Post vital signs: Reviewed and stable  Complications: No apparent anesthesia complications

## 2012-06-11 NOTE — Anesthesia Procedure Notes (Signed)
Procedure Name: Intubation Date/Time: 06/11/2012 11:31 AM Performed by: Shanon Payor Pre-anesthesia Checklist: Suction available, Emergency Drugs available, Timeout performed, Patient identified and Patient being monitored Patient Re-evaluated:Patient Re-evaluated prior to inductionOxygen Delivery Method: Circle system utilized Preoxygenation: Pre-oxygenation with 100% oxygen Intubation Type: Cricoid Pressure applied, Rapid sequence and IV induction Laryngoscope Size: Mac and 3 Grade View: Grade I Tube type: Oral Tube size: 7.0 mm Number of attempts: 1 Placement Confirmation: ETT inserted through vocal cords under direct vision,  positive ETCO2 and breath sounds checked- equal and bilateral Secured at: 20 cm Tube secured with: Tape Dental Injury: Teeth and Oropharynx as per pre-operative assessment

## 2012-06-11 NOTE — Anesthesia Preprocedure Evaluation (Addendum)
Anesthesia Evaluation  Patient identified by MRN, date of birth, ID band Patient awake    Reviewed: Allergy & Precautions, H&P , NPO status , Patient's Chart, lab work & pertinent test results, reviewed documented beta blocker date and time   History of Anesthesia Complications Negative for: history of anesthetic complications  Airway Mallampati: II TM Distance: >3 FB Neck ROM: full    Dental  (+) Partial Upper and Partial Lower   Pulmonary neg pulmonary ROS,  breath sounds clear to auscultation  Pulmonary exam normal       Cardiovascular Exercise Tolerance: Good hypertension, On Medications Rhythm:regular Rate:Normal  Seen at [facility] for chest pain last month - no MI. Echo 4/13 - EF 55-60%, wall motion and valves normal   Neuro/Psych  Headaches (daily headaches), PSYCHIATRIC DISORDERS (depression/anxiety - no meds) TIA (no residual symptoms)   GI/Hepatic Neg liver ROS, hiatal hernia, GERD-  Medicated,schatzki's ring   Endo/Other  Type 2, Oral Hypoglycemic AgentsMorbid obesity  Renal/GU negative Renal ROS  Female GU complaint (PMB)     Musculoskeletal  (+) Arthritis - (knees, lower back), Osteoarthritis,    Abdominal   Peds  Hematology negative hematology ROS (+)   Anesthesia Other Findings   Reproductive/Obstetrics negative OB ROS                          Anesthesia Physical Anesthesia Plan  ASA: III  Anesthesia Plan: General ETT   Post-op Pain Management:    Induction:   Airway Management Planned:   Additional Equipment:   Intra-op Plan:   Post-operative Plan:   Informed Consent: I have reviewed the patients History and Physical, chart, labs and discussed the procedure including the risks, benefits and alternatives for the proposed anesthesia with the patient or authorized representative who has indicated his/her understanding and acceptance.   Dental Advisory  Given  Plan Discussed with: CRNA and Surgeon  Anesthesia Plan Comments:         Anesthesia Quick Evaluation

## 2012-06-12 ENCOUNTER — Other Ambulatory Visit: Payer: Self-pay | Admitting: Family Medicine

## 2012-06-12 DIAGNOSIS — Z01810 Encounter for preprocedural cardiovascular examination: Secondary | ICD-10-CM

## 2012-06-12 HISTORY — PX: DILATION AND CURETTAGE OF UTERUS: SHX78

## 2012-06-12 NOTE — Telephone Encounter (Signed)
Please let pt know she is being referred to card for evaluation prior to her gyne surgery, also let Dr Cherly Hensen office know. Referral has been entered

## 2012-06-12 NOTE — Telephone Encounter (Signed)
Pt has appt with dr Dietrich Pates for 07/04/2012 11:15. Pt is aware of appt and time

## 2012-06-12 NOTE — Op Note (Signed)
NAME:  [name] [name], [name] [name]                ACCOUNT NO.:  [id number]  MEDICAL RECORD NO.:  [id number]  LOCATION:  [facility]                          FACILITY:  [facility]  PHYSICIAN:  Maxie Better, M.D.DATE OF BIRTH:  09/01/1956  DATE OF PROCEDURE:  06/11/2012 DATE OF DISCHARGE:  06/11/2012                              OPERATIVE REPORT   PREOPERATIVE DIAGNOSIS:  Postmenopausal bleeding, endometrial mass.  POSTOPERATIVE DIAGNOSIS:  Postmenopausal bleeding, endometrial polyp.  PROCEDURES:  Diagnostic hysteroscopy, hysteroscopic resection of endometrial polyp using VersaPoint, dilation and curettage.  ANESTHESIA:  General, paracervical block.  SURGEON:  Maxie Better, M.D.  ASSISTANT:  None.  PROCEDURE IN DETAIL:  Under adequate general anesthesia, the patient was placed in the dorsal lithotomy position.  She was sterilely prepped and draped in usual fashion.  Bladder was catheterized for moderate amount of urine.  Examination under anesthesia revealed a retroverted uterus. No adnexal masses could be appreciated.  A bivalve speculum was placed in the vagina.  10 mL of 1% Nesacaine was injected paracervically at 3 and 9 o'clock position.  The anterior lip of the cervix was grasped with a single-tooth tenaculum.  The cervix was serially dilated to a #31 Pratt dilator.  A glycine primed resectoscope using the VersaPoint was inserted into the uterine cavity without incident.  The endocervical canal was without any lesions.  On the right lateral wall, there was a large polypoid lesion with some abnormal appearing vessels that was carefully resected.  The rest of the cavity was notable for some thickening with some white questionable calcification.  The left tubal ostia could be seen and the right could not be seen clearly.  Once the polyp was resected, the cavity was then curetted.  The resectoscope was then reinserted.  Additional resections were then made of areas that were protruding into  the endometrium.  Once this was felt to be completed, all instruments were then removed from the vagina.  SPECIMEN:  Endometrial polyp and endometrial curettings sent to pathology.  ESTIMATED BLOOD LOSS:  Minimal.  FLUID DEFICIT:  75 mL.  COMPLICATION:  None.  The patient tolerated the procedure well, was transferred to recovery room in stable condition.     Maxie Better, M.D.     [place]/MEDQ  D:  06/11/2012  T:  06/12/2012  Job:  [id number]

## 2012-06-30 ENCOUNTER — Emergency Department (HOSPITAL_COMMUNITY)
Admission: EM | Admit: 2012-06-30 | Discharge: 2012-06-30 | Disposition: A | Discharge status: Home / Self Care | Payer: Medicare HMO | Attending: Emergency Medicine | Admitting: Emergency Medicine

## 2012-06-30 ENCOUNTER — Emergency Department (HOSPITAL_COMMUNITY): Payer: Medicare HMO

## 2012-06-30 ENCOUNTER — Encounter (HOSPITAL_COMMUNITY): Payer: Self-pay | Admitting: *Deleted

## 2012-06-30 DIAGNOSIS — K219 Gastro-esophageal reflux disease without esophagitis: Secondary | ICD-10-CM | POA: Insufficient documentation

## 2012-06-30 DIAGNOSIS — R079 Chest pain, unspecified: Secondary | ICD-10-CM | POA: Insufficient documentation

## 2012-06-30 DIAGNOSIS — I10 Essential (primary) hypertension: Secondary | ICD-10-CM | POA: Insufficient documentation

## 2012-06-30 DIAGNOSIS — G8929 Other chronic pain: Secondary | ICD-10-CM | POA: Insufficient documentation

## 2012-06-30 DIAGNOSIS — E119 Type 2 diabetes mellitus without complications: Secondary | ICD-10-CM | POA: Insufficient documentation

## 2012-06-30 DIAGNOSIS — E785 Hyperlipidemia, unspecified: Secondary | ICD-10-CM | POA: Insufficient documentation

## 2012-06-30 DIAGNOSIS — Z8673 Personal history of transient ischemic attack (TIA), and cerebral infarction without residual deficits: Secondary | ICD-10-CM | POA: Insufficient documentation

## 2012-06-30 DIAGNOSIS — M1990 Unspecified osteoarthritis, unspecified site: Secondary | ICD-10-CM | POA: Insufficient documentation

## 2012-06-30 DIAGNOSIS — D509 Iron deficiency anemia, unspecified: Secondary | ICD-10-CM | POA: Insufficient documentation

## 2012-06-30 LAB — D-DIMER, QUANTITATIVE: D-Dimer, Quant: <0.27 ug/mL-FEU (ref 0.00–0.48)

## 2012-06-30 LAB — CBC
Hemoglobin: 11.6 g/dL — ABNORMAL LOW (ref 12.0–15.0)
MCH: 27.4 pg (ref 26.0–34.0)
MCHC: 32.2 g/dL (ref 30.0–36.0)
Platelets: 298 10*3/uL (ref 150–400)

## 2012-06-30 LAB — BASIC METABOLIC PANEL
BUN: 16 mg/dL (ref 6–23)
Calcium: 9.9 mg/dL (ref 8.4–10.5)
GFR calc Af Amer: 77 mL/min — ABNORMAL LOW (ref >90)
GFR calc non Af Amer: 66 mL/min — ABNORMAL LOW (ref >90)
Glucose, Bld: 137 mg/dL — ABNORMAL HIGH (ref 70–99)
Potassium: 4.3 mEq/L (ref 3.5–5.1)
Sodium: 136 mEq/L (ref 135–145)

## 2012-06-30 MED ORDER — NAPROXEN 375 MG PO TABS: 375.0000 mg | ORAL_TABLET | Freq: Two times a day (BID) | ORAL | Status: DC

## 2012-06-30 MED ORDER — CYCLOBENZAPRINE HCL 10 MG PO TABS: 10.0000 mg | ORAL_TABLET | Freq: Two times a day (BID) | ORAL | Status: DC | PRN

## 2012-06-30 NOTE — ED Notes (Signed)
Chest pain, described as tightness, began this morning at 0200. Pt also states "feels like fluid in my chest", coughing up clear fluid earlier, per pt. . Numbness to legs bilat.

## 2012-06-30 NOTE — ED Provider Notes (Addendum)
History   This chart was scribed for Donnetta Hutching, MD by Toya Smothers. The patient was seen in room APA16A/APA16A. Patient's care was started at 1147.  CSN: [id number]  Arrival date & time 06/30/12  1147   First MD Initiated Contact with Patient 06/30/12 1210      Chief Complaint  Patient presents with  . Chest Pain   HPI  [name] [name] is a 56 y.o. female with a h/o diabetes, hypertension, and GERD who presents to the Emergency Department complaining of 10 hours of new sudden onset moderate constant chest pain. Pt was well until this morning. Pain is described as tightness, as if "fluid is within her chest." Tightness is aggravated with deep breathing. Pt endorses associate moderate cough producing clear phlegm and numbness of lower extremities bilaterally.  Pt denies fever, chills, emesis, and rash.    Past Medical History  Diagnosis Date  . Anxiety   . Depression   . GERD (gastroesophageal reflux disease)   . Mixed hyperlipidemia   . Essential hypertension, benign   . Low back pain   . Osteoarthritis   . Constipation   . Varicose veins   . Positive H. pylori test   . Glaucoma     Last eye exam 9/08  . Hiatal hernia   . Schatzki's ring   . Diabetes mellitus, type 2   . TIA (transient ischemic attack)     6/10  . Iron deficiency anemia   . Chronic abdominal pain     Past Surgical History  Procedure Date  . Lumbar discetomy 10/01    Dr. Fabiola Backer   . Tubal ligation 1994  . Cholecystectomy Dec 26, 2010  . Umbilical hernia repair 12/26/2010  . Spine surgery   . Colonoscopy 01/2006  . Esophagogastroduodenoscopy     Esophageal dilation 2007  . Dilation and curettage of uterus 06/12/12    Family History  Problem Relation Age of Onset  . Hypertension Father   . Diabetes Father   . Diabetes Mother   . Arrhythmia Daughter     Defibrillator   . Scoliosis Daughter   . Healthy Sister   . Heart disease Daughter   . Healthy Daughter     History  Substance Use Topics  .  Smoking status: Never Smoker   . Smokeless tobacco: Never Used  . Alcohol Use: No   Review of Systems  Respiratory: Positive for cough and shortness of breath.     Allergies  Contrast media; Lisinopril; and Buspar  Home Medications   Current Outpatient Rx  Name Route Sig Dispense Refill  . AMLODIPINE BESYLATE 10 MG PO TABS Oral Take 10 mg by mouth every morning.     . ASPIRIN 325 MG PO TABS Oral Take 325 mg by mouth every morning.     . FUROSEMIDE 40 MG PO TABS Oral Take 40 mg by mouth daily as needed. For swelling    . HYDROCODONE-ACETAMINOPHEN 7.5-325 MG PO TABS Oral Take 1 tablet by mouth every 6 (six) hours as needed. pain    . LOSARTAN POTASSIUM 50 MG PO TABS Oral Take 2 tablets (100 mg total) by mouth every morning. 60 tablet 6  . METFORMIN HCL 500 MG PO TABS Oral Take 1 tablet (500 mg total) by mouth daily with breakfast. 90 tablet 1  . NABUMETONE 750 MG PO TABS Oral Take 750 mg by mouth daily as needed. For pain/inflammation    . NYSTATIN-TRIAMCINOLONE 100000-0.1 UNIT/GM-% EX OINT Topical Apply 1 application  topically Twice daily.    Marland Kitchen OMEPRAZOLE 20 MG PO CPDR  TAKE 1 CAPSULE TWICE DAILY ( DOSE INCREASE ) 180 capsule 2  . POLYETHYLENE GLYCOL 3350 PO POWD Oral Take 17 g by mouth daily as needed. For constipation    . PROPYLENE GLYCOL 0.6 % OP SOLN Ophthalmic Apply 1 drop to eye daily.     Marland Kitchen ROSUVASTATIN CALCIUM 10 MG PO TABS Oral Take 10 mg by mouth at bedtime.    . SPIRONOLACTONE 25 MG PO TABS Oral Take 25 mg by mouth every morning.     Marland Kitchen ZOLPIDEM TARTRATE 5 MG PO TABS Oral Take 1 tablet (5 mg total) by mouth at bedtime as needed for sleep. 30 tablet 2    Dose reduction effective 05/23/2012    BP 123/73  Pulse 79  Resp 14  SpO2 95%  LMP 06/03/2012  Physical Exam  Nursing note and vitals reviewed. Constitutional: She is oriented to person, place, and time. She appears well-developed and well-nourished.  HENT:  Head: Normocephalic and atraumatic.  Eyes: Conjunctivae  normal and EOM are normal. Pupils are equal, round, and reactive to light.  Neck: Normal range of motion. Neck supple.  Cardiovascular: Normal rate, regular rhythm and normal heart sounds.   Pulmonary/Chest: Effort normal and breath sounds normal.  Abdominal: Soft. Bowel sounds are normal.  Musculoskeletal: Normal range of motion.  Neurological: She is alert and oriented to person, place, and time.  Skin: Skin is warm and dry.  Psychiatric: She has a normal mood and affect.    ED Course  Procedures (including critical care time) COORDINATION OF CARE: 12:24- Evaluated Pt. Pt is awake, alert, and oriented. 12:04- Ordered Troponin I STAT. 12:05- Ordered CBC STAT. 12:05- Ordered DG Chest 2 View 1 time imaging  12:05- Ordered Basic metabolic panel STAT. 12:41- Ordered D-dimer, quantitative Once.  Results for orders placed during the hospital encounter of 06/30/12  TROPONIN I      Component Value Range   Troponin I <0.30  <0.30 ng/mL  BASIC METABOLIC PANEL      Component Value Range   Sodium 136  135 - 145 mEq/L   Potassium 4.3  3.5 - 5.1 mEq/L   Chloride 95 (*) 96 - 112 mEq/L   CO2 30  19 - 32 mEq/L   Glucose, Bld 137 (*) 70 - 99 mg/dL   BUN 16  6 - 23 mg/dL   Creatinine, Ser 1.61  0.50 - 1.10 mg/dL   Calcium 9.9  8.4 - 09.6 mg/dL   GFR calc non Af Amer 66 (*) >90 mL/min   GFR calc Af Amer 77 (*) >90 mL/min  CBC      Component Value Range   WBC 9.2  4.0 - 10.5 K/uL   RBC 4.24  3.87 - 5.11 MIL/uL   Hemoglobin 11.6 (*) 12.0 - 15.0 g/dL   HCT 04.5  40.9 - 81.1 %   MCV 84.9  78.0 - 100.0 fL   MCH 27.4  26.0 - 34.0 pg   MCHC 32.2  30.0 - 36.0 g/dL   RDW 91.4  78.2 - 95.6 %   Platelets 298  150 - 400 K/uL  D-DIMER, QUANTITATIVE      Component Value Range   D-Dimer, Quant <0.27  0.00 - 0.48 ug/mL-FEU   Dg Chest 2 View  06/30/2012  *RADIOLOGY REPORT*  Clinical Data: Chest pain.  Short of breath.  CHEST - 2 VIEW  Comparison: May 17, 2012  Findings: Hyperaeration.  Normal  heart size.  No consolidation, mass, pneumothorax, pleural effusion.  IMPRESSION: Changes related to COPD.  No active cardiopulmonary disease.   Original Report Authenticated By: Donavan Burnet, M.D.      No diagnosis found.  Date: 06/30/2012  Rate: 77  Rhythm: normal sinus rhythm  QRS Axis: normal  Intervals: normal  ST/T Wave abnormalities: normal  Conduction Disutrbances: none  Narrative Interpretation: unremarkable      MDM  Patient no acute distress. Screening tests were normal. Patient looks well. She understands to followup with her primary care doctor for further testing and possible referral to a cardiologist    I personally performed the services described in this documentation, which was scribed in my presence. The recorded information has been reviewed and considered.      Donnetta Hutching, MD 06/30/12 1607  Donnetta Hutching, MD 07/25/12 [phone]

## 2012-07-02 ENCOUNTER — Ambulatory Visit: Payer: Self-pay | Admitting: Gynecologic Oncology

## 2012-07-04 ENCOUNTER — Ambulatory Visit: Payer: Medicare HMO | Admitting: Cardiology

## 2012-07-05 ENCOUNTER — Encounter: Payer: Self-pay | Admitting: Cardiovascular Disease

## 2012-07-05 ENCOUNTER — Other Ambulatory Visit: Payer: Self-pay | Admitting: Family Medicine

## 2012-07-05 ENCOUNTER — Ambulatory Visit (INDEPENDENT_AMBULATORY_CARE_PROVIDER_SITE_OTHER): Payer: Medicare HMO | Admitting: Cardiovascular Disease

## 2012-07-05 ENCOUNTER — Encounter: Payer: Self-pay | Admitting: *Deleted

## 2012-07-05 VITALS — BP 110/70 | HR 85 | Ht 65.0 in | Wt 219.0 lb

## 2012-07-05 DIAGNOSIS — R002 Palpitations: Secondary | ICD-10-CM

## 2012-07-05 DIAGNOSIS — Z01818 Encounter for other preprocedural examination: Secondary | ICD-10-CM

## 2012-07-05 DIAGNOSIS — I10 Essential (primary) hypertension: Secondary | ICD-10-CM

## 2012-07-05 DIAGNOSIS — R079 Chest pain, unspecified: Secondary | ICD-10-CM

## 2012-07-05 DIAGNOSIS — E785 Hyperlipidemia, unspecified: Secondary | ICD-10-CM

## 2012-07-05 NOTE — Progress Notes (Signed)
Patient ID: SEBASTIAN DZIK, female   DOB: [birth year], 56 y.o.   MRN: [id number] 56 yo referred for cardiac clearance by Dr Hyacinth Meeker for hysterectomy.  She was in ER 8/2 and 9/15 for atypical chest pain.  Records reviewed Both times R/O with no ECG changes and no abnormalities on CXR or telemetry.  Has seen Dr Dietrich Pates in past   Echo 01/23/12 normal  Seen by Dr Diona Browner for pal;pitations 8/12 with negative event monitor.  No recent ETT.  Chest pain is atypical nonexertional central in chest  No ppt factors  Feels fullness in chest Has had EGD with dilatation before no GI overtones  Study Conclusions  - Left ventricle: The cavity size was normal. Systolic function was normal. The estimated ejection fraction was in the range of 55% to 60%. Wall motion was normal; there were no regional wall motion abnormalities. - Aortic valve: Mildly calcified annulus. Trileaflet; normal thickness leaflets. - Mitral valve: Calcified annulus. - Atrial septum: No defect or patent foramen ovale was identified. Transthoracic echocardiography  ROS: Denies fever, malais, weight loss, blurry vision, decreased visual acuity, cough, sputum, SOB, hemoptysis, pleuritic pain, palpitaitons, heartburn, abdominal pain, melena, lower extremity edema, claudication, or rash.  All other systems reviewed and negative   General: Affect appropriate Healthy:  appears stated age HEENT: normal Neck supple with no adenopathy JVP normal no bruits no thyromegaly Lungs clear with no wheezing and good diaphragmatic motion Heart:  S1/S2 no murmur,rub, gallop or click PMI normal Abdomen: benighn, BS positve, no tenderness, no AAA no bruit.  No HSM or HJR Distal pulses intact with no bruits No edema Neuro non-focal Skin warm and dry No muscular weakness  Medications Current Outpatient Prescriptions  Medication Sig Dispense Refill  . amLODipine (NORVASC) 10 MG tablet Take 10 mg by mouth every morning.       Marland Kitchen aspirin 325 MG tablet  Take 325 mg by mouth every morning.       . cyclobenzaprine (FLEXERIL) 10 MG tablet as directed.      . furosemide (LASIX) 40 MG tablet Take 40 mg by mouth daily as needed. For swelling      . HYDROcodone-acetaminophen (NORCO) 7.5-325 MG per tablet Take 1 tablet by mouth every 6 (six) hours as needed. pain      . ibuprofen (ADVIL,MOTRIN) 800 MG tablet as directed.      Marland Kitchen losartan (COZAAR) 50 MG tablet Take 2 tablets (100 mg total) by mouth every morning.  60 tablet  6  . metFORMIN (GLUCOPHAGE) 500 MG tablet Take 1 tablet (500 mg total) by mouth daily with breakfast.  90 tablet  1  . nabumetone (RELAFEN) 750 MG tablet Take 750 mg by mouth daily as needed. For pain/inflammation      . naproxen (NAPROSYN) 375 MG tablet Take 1 tablet (375 mg total) by mouth 2 (two) times daily.  20 tablet  0  . nystatin-triamcinolone ointment (MYCOLOG) Apply 1 application topically Twice daily.      Marland Kitchen omeprazole (PRILOSEC) 20 MG capsule TAKE 1 CAPSULE TWICE DAILY ( DOSE INCREASE )  180 capsule  2  . polyethylene glycol powder (GLYCOLAX/MIRALAX) powder Take 17 g by mouth daily as needed. For constipation      . rosuvastatin (CRESTOR) 10 MG tablet Take 10 mg by mouth at bedtime.      Marland Kitchen spironolactone (ALDACTONE) 25 MG tablet Take 25 mg by mouth every morning.       . zolpidem (AMBIEN) 5 MG tablet Take 1  tablet (5 mg total) by mouth at bedtime as needed for sleep.  30 tablet  2    Allergies Contrast media; Lisinopril; and Buspar  Family History: Family History  Problem Relation Age of Onset  . Hypertension Father   . Diabetes Father   . Diabetes Mother   . Arrhythmia Daughter     Defibrillator   . Scoliosis Daughter   . Healthy Sister   . Heart disease Daughter   . Healthy Daughter     Social History: History   Social History  . Marital Status: Widowed    Spouse Name: N/A    Number of Children: N/A  . Years of Education: N/A   Occupational History  . Not on file.   Social History Main Topics    . Smoking status: Never Smoker   . Smokeless tobacco: Never Used  . Alcohol Use: No  . Drug Use: No  . Sexually Active: Not Currently   Other Topics Concern  . Not on file   Social History Narrative   Lives with children    Electrocardiogram:  NSR LVH no actue ischemic changes 06/30/12  Assessment and Plan

## 2012-07-05 NOTE — Patient Instructions (Addendum)
Your physician has requested that you have a lexiscan myoview. For further information please visit [email or web address]. Please follow instruction sheet, as given.   

## 2012-07-05 NOTE — Assessment & Plan Note (Signed)
Well controlled.  Continue current medications and low sodium Dash type diet.    

## 2012-07-05 NOTE — Assessment & Plan Note (Signed)
Atypical Recurrent ER visits and needs surgery  F/U stress myovue. Doubt significant CAD

## 2012-07-05 NOTE — Assessment & Plan Note (Signed)
Resolved Nothing on monitor  Normal echo 2012  Observe

## 2012-07-05 NOTE — Assessment & Plan Note (Signed)
Cholesterol is at goal.  Continue current dose of statin and diet Rx.  No myalgias or side effects.  F/U  LFT's in 6 months. Lab Results  Component Value Date   LDLCALC 67 05/07/2012

## 2012-07-08 ENCOUNTER — Ambulatory Visit: Payer: Medicare HMO | Admitting: Family Medicine

## 2012-07-08 LAB — CREATININE, SERUM
Creatinine: 1.14 mg/dL (ref 0.60–1.30)
EGFR (Non-African Amer.): 53 — ABNORMAL LOW

## 2012-07-10 ENCOUNTER — Encounter (HOSPITAL_COMMUNITY): Admission: RE | Admit: 2012-07-10 | Payer: Medicare HMO | Source: Ambulatory Visit

## 2012-07-10 ENCOUNTER — Other Ambulatory Visit: Payer: Self-pay | Admitting: Obstetrics and Gynecology

## 2012-07-10 ENCOUNTER — Encounter (HOSPITAL_COMMUNITY)
Admission: RE | Admit: 2012-07-10 | Discharge: 2012-07-10 | Disposition: A | Discharge status: Home / Self Care | Payer: Medicare HMO | Source: Ambulatory Visit | Attending: Cardiovascular Disease | Admitting: Cardiovascular Disease

## 2012-07-10 DIAGNOSIS — R079 Chest pain, unspecified: Secondary | ICD-10-CM

## 2012-07-10 DIAGNOSIS — C541 Malignant neoplasm of endometrium: Secondary | ICD-10-CM

## 2012-07-10 DIAGNOSIS — Z01818 Encounter for other preprocedural examination: Secondary | ICD-10-CM

## 2012-07-11 ENCOUNTER — Inpatient Hospital Stay: Admission: RE | Admit: 2012-07-11 | Payer: Medicare HMO | Source: Ambulatory Visit

## 2012-07-11 ENCOUNTER — Encounter (HOSPITAL_COMMUNITY)
Admission: RE | Admit: 2012-07-11 | Discharge: 2012-07-11 | Disposition: A | Discharge status: Home / Self Care | Payer: Medicare HMO | Source: Ambulatory Visit | Attending: Cardiovascular Disease | Admitting: Cardiovascular Disease

## 2012-07-11 ENCOUNTER — Encounter (HOSPITAL_COMMUNITY): Payer: Self-pay

## 2012-07-11 ENCOUNTER — Ambulatory Visit (INDEPENDENT_AMBULATORY_CARE_PROVIDER_SITE_OTHER): Payer: Medicare HMO

## 2012-07-11 DIAGNOSIS — Z01818 Encounter for other preprocedural examination: Secondary | ICD-10-CM

## 2012-07-11 DIAGNOSIS — R079 Chest pain, unspecified: Secondary | ICD-10-CM | POA: Insufficient documentation

## 2012-07-11 DIAGNOSIS — R002 Palpitations: Secondary | ICD-10-CM

## 2012-07-11 HISTORY — DX: Malignant (primary) neoplasm, unspecified: C80.1

## 2012-07-11 MED ORDER — TECHNETIUM TC 99M SESTAMIBI - CARDIOLITE
10.0000 | Freq: Once | INTRAVENOUS | Status: AC | PRN
  Administered 2012-07-11: 09:00:00 9.7 via INTRAVENOUS

## 2012-07-11 MED ORDER — TECHNETIUM TC 99M SESTAMIBI - CARDIOLITE
30.0000 | Freq: Once | INTRAVENOUS | Status: AC | PRN
  Administered 2012-07-11: 30 via INTRAVENOUS

## 2012-07-11 NOTE — Progress Notes (Signed)
Stress Lab Nurses Notes - Myrlene Chevis Fath 07/11/2012 Reason for doing test: Arrhythmia/Palpitations, Chest Pain and Surgical Clearance Type of test: Marlane Hatcher Nurse performing test: Parke Poisson, RN Nuclear Medicine Tech: Lou Cal Echo Tech: Not Applicable MD performing test: R. Rothbart & Jacolyn Reedy PA Family MD: Lodema Hong Test explained and consent signed: yes IV started: 22g jelco, Saline lock flushed, No redness or edema and Saline lock started in radiology Symptoms: " felt heart beating faster " Treatment/Intervention: None Reason test stopped: protocol completed After recovery IV was: Discontinued via X-ray tech and No redness or edema Patient to return to Nuc. Med at : 12:45 Patient discharged: Home Patient's Condition upon discharge was: stable Comments: During test BP 128/78 & HR 110.  Recovery BP 110/70 & HR 90.  Symptoms resolved in recovery. Erskine Speed T

## 2012-07-16 ENCOUNTER — Ambulatory Visit: Payer: Self-pay | Admitting: Gynecologic Oncology

## 2012-08-01 HISTORY — PX: ABDOMINAL HYSTERECTOMY: SHX81

## 2012-08-15 ENCOUNTER — Other Ambulatory Visit: Payer: Self-pay | Admitting: Family Medicine

## 2012-08-16 ENCOUNTER — Ambulatory Visit: Payer: Self-pay | Admitting: Gynecologic Oncology

## 2012-08-16 LAB — COMPLETE METABOLIC PANEL WITH GFR
ALT: <8 U/L (ref 0–35)
AST: 13 U/L (ref 0–37)
Alkaline Phosphatase: 123 U/L — ABNORMAL HIGH (ref 39–117)
Creat: 0.97 mg/dL (ref 0.50–1.10)
Sodium: 138 mEq/L (ref 135–145)
Total Bilirubin: 0.4 mg/dL (ref 0.3–1.2)
Total Protein: 6.8 g/dL (ref 6.0–8.3)

## 2012-08-16 LAB — LIPID PANEL
Cholesterol: 145 mg/dL (ref 0–200)
LDL Cholesterol: 85 mg/dL (ref 0–99)
Total CHOL/HDL Ratio: 3.7 Ratio
VLDL: 21 mg/dL (ref 0–40)

## 2012-08-16 LAB — CBC
HCT: 32.7 % — ABNORMAL LOW (ref 36.0–46.0)
Hemoglobin: 10.4 g/dL — ABNORMAL LOW (ref 12.0–15.0)
MCH: 26.1 pg (ref 26.0–34.0)
MCHC: 31.8 g/dL (ref 30.0–36.0)
MCV: 82.2 fL (ref 78.0–100.0)
RBC: 3.98 MIL/uL (ref 3.87–5.11)

## 2012-08-16 LAB — HEMOGLOBIN A1C: Hgb A1c MFr Bld: 6.3 % — ABNORMAL HIGH (ref <5.7)

## 2012-08-16 LAB — TSH: TSH: 1.89 u[IU]/mL (ref 0.350–4.500)

## 2012-08-20 ENCOUNTER — Ambulatory Visit (INDEPENDENT_AMBULATORY_CARE_PROVIDER_SITE_OTHER): Payer: Medicare HMO | Admitting: Family Medicine

## 2012-08-20 ENCOUNTER — Encounter: Payer: Self-pay | Admitting: Family Medicine

## 2012-08-20 VITALS — BP 118/78 | HR 71 | Resp 15 | Wt 217.0 lb

## 2012-08-20 DIAGNOSIS — C541 Malignant neoplasm of endometrium: Secondary | ICD-10-CM

## 2012-08-20 DIAGNOSIS — C549 Malignant neoplasm of corpus uteri, unspecified: Secondary | ICD-10-CM

## 2012-08-20 DIAGNOSIS — I10 Essential (primary) hypertension: Secondary | ICD-10-CM

## 2012-08-20 DIAGNOSIS — G4700 Insomnia, unspecified: Secondary | ICD-10-CM

## 2012-08-20 DIAGNOSIS — E8941 Symptomatic postprocedural ovarian failure: Secondary | ICD-10-CM

## 2012-08-20 MED ORDER — ALPRAZOLAM 0.25 MG PO TABS
ORAL_TABLET | ORAL | Status: DC
Start: 2012-08-20 — End: 2012-12-31

## 2012-08-20 MED ORDER — LOSARTAN POTASSIUM 50 MG PO TABS: 50.0000 mg | ORAL_TABLET | Freq: Every day | ORAL | Status: DC

## 2012-08-20 MED ORDER — CLONIDINE HCL 0.1 MG PO TABS: ORAL_TABLET | ORAL | Status: DC

## 2012-08-20 NOTE — Progress Notes (Signed)
  Subjective:    Patient ID: [name] [name], female    DOB: 07-Apr-1956, 56 y.o.   MRN: [id number]  HPI The PT is here for follow up and re-evaluation of chronic medical conditions, medication management and review of any available recent lab and radiology data.  Preventive health is updated, specifically  Cancer screening and Immunization.   She has had TAH and BSO due to localized endometrial cancer, in October, now experiencing disabling hot flashes and poor sleep. Has used some xanax in the past and is requesting short term use       Review of Systems See HPI Denies recent fever or chills. Denies sinus pressure, nasal congestion, ear pain or sore throat. Denies chest congestion, productive cough or wheezing. Denies chest pains, palpitations and leg swelling Denies abdominal pain, nausea, vomiting,diarrhea or constipation.   Denies dysuria, frequency, hesitancy or incontinence. Denies joint pain, swelling and limitation in mobility. Denies skin break down or rash. Increased anxiety and insomnia       Objective:   Physical Exam  Patient alert and oriented and in no cardiopulmonary distress.  HEENT: No facial asymmetry, EOMI, no sinus tenderness,  oropharynx pink and moist.  Neck supple no adenopathy.  Chest: Clear to auscultation bilaterally.  CVS: S1, S2 no murmurs, no S3.  ABD: Soft non tender. Bowel sounds normal.  Ext: No edema  MS: Adequate ROM spine, shoulders, hips and knees.  Skin: Intact, no ulcerations or rash noted.  Psych: Good eye contact, normal affect. Memory intact anxious not  depressed appearing.  CNS: CN 2-12 intact, power, tone and sensation normal throughout.       Assessment & Plan:

## 2012-08-20 NOTE — Patient Instructions (Addendum)
F/u in 5 to 6 weeks.Call if you need me before  I am truly thankful that your surgery went well and it was a cure  New medication clonidine at bedtime for hot flashes and sleep.  Stop zolpidem  New is xanax one at bedtime for sleep  Reduce cozaar 50mg  to ONE daily.  Blood sugar is excellent  With improved sleep, your neck pain should improve, and xanax also helps with muscle spasm

## 2012-08-25 ENCOUNTER — Encounter: Payer: Self-pay | Admitting: Family Medicine

## 2012-08-25 DIAGNOSIS — C541 Malignant neoplasm of endometrium: Secondary | ICD-10-CM | POA: Insufficient documentation

## 2012-08-25 NOTE — Assessment & Plan Note (Signed)
At goal however medication changes to help to address the hot flashes which are intense at this time

## 2012-08-25 NOTE — Assessment & Plan Note (Signed)
Experiencing hot flashes and poor sleep post surgery, gave general advice as to ow to deal with this and pt to start clonidine and xanax short term

## 2012-08-25 NOTE — Assessment & Plan Note (Signed)
Start clonidine at bedtime, reduce cozar dose and short term xanax to help with sleep and anxiety

## 2012-09-11 ENCOUNTER — Other Ambulatory Visit: Payer: Self-pay

## 2012-09-11 DIAGNOSIS — I10 Essential (primary) hypertension: Secondary | ICD-10-CM

## 2012-09-11 MED ORDER — LOSARTAN POTASSIUM 50 MG PO TABS: 50.0000 mg | ORAL_TABLET | Freq: Every day | ORAL | Status: DC

## 2012-09-20 ENCOUNTER — Other Ambulatory Visit: Payer: Self-pay | Admitting: Family Medicine

## 2012-09-30 ENCOUNTER — Encounter (HOSPITAL_COMMUNITY): Payer: Self-pay | Admitting: Cardiology

## 2012-10-01 ENCOUNTER — Encounter: Payer: Self-pay | Admitting: Family Medicine

## 2012-10-01 ENCOUNTER — Ambulatory Visit (INDEPENDENT_AMBULATORY_CARE_PROVIDER_SITE_OTHER): Payer: Medicare HMO | Admitting: Family Medicine

## 2012-10-01 VITALS — BP 130/82 | HR 63 | Resp 16 | Ht 65.0 in | Wt 224.0 lb

## 2012-10-01 DIAGNOSIS — I10 Essential (primary) hypertension: Secondary | ICD-10-CM

## 2012-10-01 DIAGNOSIS — E8941 Symptomatic postprocedural ovarian failure: Secondary | ICD-10-CM

## 2012-10-01 DIAGNOSIS — M545 Low back pain, unspecified: Secondary | ICD-10-CM

## 2012-10-01 DIAGNOSIS — R5383 Other fatigue: Secondary | ICD-10-CM

## 2012-10-01 DIAGNOSIS — R7303 Prediabetes: Secondary | ICD-10-CM

## 2012-10-01 DIAGNOSIS — E669 Obesity, unspecified: Secondary | ICD-10-CM

## 2012-10-01 DIAGNOSIS — K219 Gastro-esophageal reflux disease without esophagitis: Secondary | ICD-10-CM

## 2012-10-01 DIAGNOSIS — R7309 Other abnormal glucose: Secondary | ICD-10-CM

## 2012-10-01 DIAGNOSIS — M79609 Pain in unspecified limb: Secondary | ICD-10-CM

## 2012-10-01 DIAGNOSIS — R5381 Other malaise: Secondary | ICD-10-CM

## 2012-10-01 DIAGNOSIS — F411 Generalized anxiety disorder: Secondary | ICD-10-CM

## 2012-10-01 DIAGNOSIS — M79602 Pain in left arm: Secondary | ICD-10-CM

## 2012-10-01 DIAGNOSIS — E785 Hyperlipidemia, unspecified: Secondary | ICD-10-CM

## 2012-10-01 MED ORDER — OMEPRAZOLE 20 MG PO CPDR: 20.0000 mg | DELAYED_RELEASE_CAPSULE | Freq: Every day | ORAL | Status: DC

## 2012-10-01 MED ORDER — NABUMETONE 750 MG PO TABS: ORAL_TABLET | ORAL | Status: DC

## 2012-10-01 MED ORDER — LOSARTAN POTASSIUM 50 MG PO TABS: ORAL_TABLET | ORAL | Status: DC

## 2012-10-01 NOTE — Patient Instructions (Addendum)
F/u early March, please call if you need me before.  You are doing very well   Stop clonidine and lasix.  Reduce to once daily the prilosec and nabumetone   No need to test blood sugar , you are not diabetic, you are prediabetic, need to keep carbs and sugar intake low, and work on weight loss   Fasting lipid, cmp and EGFR and HBA1C in March

## 2012-10-01 NOTE — Assessment & Plan Note (Addendum)
Improved blood sugar control, no longer needs medication for blood sugar control, now prediabetic F/u lab in 2 monht

## 2012-10-01 NOTE — Assessment & Plan Note (Signed)
Improved, once daily PPI will taper off next year if possible

## 2012-10-01 NOTE — Assessment & Plan Note (Signed)
Improved and controlled with xanax at bedtime

## 2012-10-01 NOTE — Assessment & Plan Note (Signed)
Hyperlipidemia:Low fat diet discussed and encouraged.  Controlled, no change in medication   

## 2012-10-01 NOTE — Progress Notes (Signed)
  Subjective:    Patient ID: [name] [name], female    DOB: 1956/04/21, 56 y.o.   MRN: [id number]  HPI The PT is here for follow up and re-evaluation of chronic medical conditions, medication management and review of any available recent lab and radiology data.  Preventive health is updated, specifically  Cancer screening and Immunization.   Questions or concerns regarding consultations or procedures which the PT has had in the interim are  addressed. The PT denies any adverse reactions to current medications since the last visit.  Generally she has continued to feel better over time following the gyne surgery.  Abdominal pAIN MUCH IMPROVED, ALSO NIGHT SWEATS LESS, HAS DISCONTINUED CLONIDINE INDEPENDENTLY WITH NO ADVERSE EFFECT. C/o intermittent left shoulder and upper arm pain, no trauma, anti inflammatory relieves, has established arthritis and sidc disease in her upper and lower back   Review of Systems See HPI Denies recent fever or chills. Denies sinus pressure, nasal congestion, ear pain or sore throat. Denies chest congestion, productive cough or wheezing. Denies chest pains, palpitations and leg swelling Denies abdominal pain, nausea, vomiting,diarrhea or constipation.   Denies dysuria, frequency, hesitancy or incontinence. . Denies headaches, seizures, numbness, or tingling. Denies depression, anxiety and  insomnia are better controlled with xanax. Denies skin break down or rash.        Objective:   Physical Exam Patient alert and oriented and in no cardiopulmonary distress.  HEENT: No facial asymmetry, EOMI, no sinus tenderness,  oropharynx pink and moist.  Neck supple no adenopathy.  Chest: Clear to auscultation bilaterally.  CVS: S1, S2 no murmurs, no S3.  ABD: Soft non tender. Bowel sounds normal.  Ext: No edema  MS: Adequate ROM spine, shoulders, hips and knees.  Skin: Intact, no ulcerations or rash noted.  Psych: Good eye contact, normal affect. Memory  intact not anxious or depressed appearing.  CNS: CN 2-12 intact, power, tone and sensation normal throughout.        Assessment & Plan:

## 2012-10-01 NOTE — Assessment & Plan Note (Signed)
Controlled, no change in medication DASH diet and commitment to daily physical activity for a minimum of 30 minutes discussed and encouraged, as a part of hypertension management. The importance of attaining a healthy weight is also discussed.  

## 2012-10-01 NOTE — Assessment & Plan Note (Addendum)
Improved pt had discontinued clonidine, she does not need to resume clonidine

## 2012-10-01 NOTE — Assessment & Plan Note (Signed)
Deteriorated. Patient re-educated about  the importance of commitment to a  minimum of 150 minutes of exercise per week. The importance of healthy food choices with portion control discussed. Encouraged to start a food diary, count calories and to consider  joining a support group. Sample diet sheets offered. Goals set by the patient for the next several months.    

## 2012-10-01 NOTE — Assessment & Plan Note (Signed)
Chronic ongoing worse at night, likely due to djd cervical spine , anti inflammatory beneficial no othe management at this time

## 2012-10-22 ENCOUNTER — Ambulatory Visit: Payer: Self-pay | Admitting: Gynecologic Oncology

## 2012-10-22 LAB — URINALYSIS, COMPLETE
Bacteria: NONE SEEN
Bilirubin,UR: NEGATIVE
Hyaline Cast: 3
Ketone: NEGATIVE
Leukocyte Esterase: NEGATIVE
Nitrite: NEGATIVE
RBC,UR: 1 /HPF (ref 0–5)
Specific Gravity: 1.019 (ref 1.003–1.030)
Squamous Epithelial: 1

## 2012-10-24 LAB — URINE CULTURE

## 2012-11-16 ENCOUNTER — Ambulatory Visit: Payer: Self-pay | Admitting: Gynecologic Oncology

## 2012-12-03 ENCOUNTER — Telehealth: Payer: Self-pay | Admitting: Family Medicine

## 2012-12-03 ENCOUNTER — Other Ambulatory Visit: Payer: Self-pay

## 2012-12-03 MED ORDER — AMLODIPINE BESYLATE 10 MG PO TABS: 10.0000 mg | ORAL_TABLET | ORAL | Status: DC

## 2012-12-03 MED ORDER — ROSUVASTATIN CALCIUM 10 MG PO TABS: 10.0000 mg | ORAL_TABLET | Freq: Every day | ORAL | Status: DC

## 2012-12-03 NOTE — Telephone Encounter (Signed)
meds refilled 

## 2012-12-25 LAB — COMPLETE METABOLIC PANEL WITH GFR
ALT: 10 U/L (ref 0–35)
AST: 17 U/L (ref 0–37)
Albumin: 4 g/dL (ref 3.5–5.2)
Alkaline Phosphatase: 130 U/L — ABNORMAL HIGH (ref 39–117)
BUN: 15 mg/dL (ref 6–23)
Potassium: 4.2 mEq/L (ref 3.5–5.3)

## 2012-12-25 LAB — LIPID PANEL
Cholesterol: 137 mg/dL (ref 0–200)
LDL Cholesterol: 73 mg/dL (ref 0–99)
Total CHOL/HDL Ratio: 2.7 Ratio
VLDL: 14 mg/dL (ref 0–40)

## 2012-12-25 LAB — HEMOGLOBIN A1C
Hgb A1c MFr Bld: 6.4 % — ABNORMAL HIGH (ref <5.7)
Mean Plasma Glucose: 137 mg/dL — ABNORMAL HIGH (ref <117)

## 2012-12-30 ENCOUNTER — Other Ambulatory Visit: Payer: Self-pay | Admitting: Family Medicine

## 2012-12-31 ENCOUNTER — Encounter: Payer: Self-pay | Admitting: Family Medicine

## 2012-12-31 ENCOUNTER — Ambulatory Visit (INDEPENDENT_AMBULATORY_CARE_PROVIDER_SITE_OTHER): Payer: Medicare HMO | Admitting: Family Medicine

## 2012-12-31 VITALS — BP 120/80 | HR 82 | Resp 18 | Ht 65.0 in | Wt 239.0 lb

## 2012-12-31 DIAGNOSIS — IMO0002 Reserved for concepts with insufficient information to code with codable children: Secondary | ICD-10-CM

## 2012-12-31 DIAGNOSIS — I10 Essential (primary) hypertension: Secondary | ICD-10-CM

## 2012-12-31 DIAGNOSIS — R7309 Other abnormal glucose: Secondary | ICD-10-CM

## 2012-12-31 DIAGNOSIS — K219 Gastro-esophageal reflux disease without esophagitis: Secondary | ICD-10-CM

## 2012-12-31 DIAGNOSIS — G4700 Insomnia, unspecified: Secondary | ICD-10-CM

## 2012-12-31 DIAGNOSIS — R7303 Prediabetes: Secondary | ICD-10-CM

## 2012-12-31 DIAGNOSIS — E669 Obesity, unspecified: Secondary | ICD-10-CM

## 2012-12-31 DIAGNOSIS — F411 Generalized anxiety disorder: Secondary | ICD-10-CM

## 2012-12-31 DIAGNOSIS — B359 Dermatophytosis, unspecified: Secondary | ICD-10-CM | POA: Insufficient documentation

## 2012-12-31 DIAGNOSIS — M1710 Unilateral primary osteoarthritis, unspecified knee: Secondary | ICD-10-CM

## 2012-12-31 DIAGNOSIS — E785 Hyperlipidemia, unspecified: Secondary | ICD-10-CM

## 2012-12-31 DIAGNOSIS — M1711 Unilateral primary osteoarthritis, right knee: Secondary | ICD-10-CM | POA: Insufficient documentation

## 2012-12-31 MED ORDER — ALPRAZOLAM 0.25 MG PO TABS: ORAL_TABLET | ORAL | Status: DC

## 2012-12-31 MED ORDER — POLYETHYLENE GLYCOL 3350 17 GM/SCOOP PO POWD: ORAL | Status: DC

## 2012-12-31 MED ORDER — TERBINAFINE HCL 1 % EX CREA: TOPICAL_CREAM | Freq: Two times a day (BID) | CUTANEOUS | Status: DC

## 2012-12-31 NOTE — Assessment & Plan Note (Signed)
Controlled, continue med as before

## 2012-12-31 NOTE — Assessment & Plan Note (Signed)
Sleep hygiene reviewed, continue xanax

## 2012-12-31 NOTE — Assessment & Plan Note (Signed)
Controlled, no change in medication Hyperlipidemia:Low fat diet discussed and encouraged.  \ 

## 2012-12-31 NOTE — Assessment & Plan Note (Signed)
Reports instability at times, currently electing to delay furhter evaluation at this time. Fall precautions stressed

## 2012-12-31 NOTE — Assessment & Plan Note (Signed)
Deteriorated.  Pt advised of the need to commit to weight loss and low carb diet

## 2012-12-31 NOTE — Patient Instructions (Addendum)
Annual wellness in 4.5 month  Blood pressure and cholesterol are excellent, continue current medication  Weight is too [place], please reduce the amount of "good food' that you eat so that you lose weight.  Blood sugar has also increased  Weight loss goal of 10 pounds please  Medication sent in for rash in left armpit.  Please call if your right knee buckling worsens and you decide you are ready to see orthopedics, you will be referred, and be careful not to fall in the interim  Non fasting cbc and hBa1C and chem 7 in 4.5 month

## 2012-12-31 NOTE — Assessment & Plan Note (Signed)
Controlled, no change in medication  

## 2012-12-31 NOTE — Assessment & Plan Note (Signed)
Deteriorated. Patient re-educated about  the importance of commitment to a  minimum of 150 minutes of exercise per week. The importance of healthy food choices with portion control discussed. Encouraged to start a food diary, count calories and to consider  joining a support group. Sample diet sheets offered. Goals set by the patient for the next several months.    

## 2012-12-31 NOTE — Assessment & Plan Note (Signed)
Rash in left armpit

## 2012-12-31 NOTE — Progress Notes (Signed)
  Subjective:    Patient ID: [name] [name], female    DOB: 05-18-1956, 57 y.o.   MRN: [id number]  HPI The PT is here for follow up and re-evaluation of chronic medical conditions, medication management and review of any available recent lab and radiology data.  Preventive health is updated, specifically  Cancer screening and Immunization.   Questions or concerns regarding consultations or procedures which the PT has had in the interim are  addressed. The PT denies any adverse reactions to current medications since the last visit.  C/o pruritic rash under left armpit x approx 3 weeks, no change in toiletries or soap noted. Has been eating an excessive amount of "good food" with significant weight gain, hopefully she will seriously attempt weight loss,states her weight was down when she was sick, but also understands the fact that her health will deteriorate as she gains weight. C/o intermiitent instability, no falls, which she attributes to right knee buckling, does not want further evaluation for this at this time    Review of Systems See HPI Denies recent fever or chills. Denies sinus pressure, nasal congestion, ear pain or sore throat. Denies chest congestion, productive cough or wheezing. Denies chest pains, palpitations and leg swelling Denies abdominal pain, nausea, vomiting,diarrhea or constipation.   Denies dysuria, frequency, hesitancy or incontinence.  Denies headaches, seizures, numbness, or tingling. Denies depression, anxiety has chronic  insomniawhich she depends on xanax for .        Objective:   Physical Exam  Patient alert and oriented and in no cardiopulmonary distress.  HEENT: No facial asymmetry, EOMI, no sinus tenderness,  oropharynx pink and moist.  Neck supple no adenopathy.  Chest: Clear to auscultation bilaterally.  CVS: S1, S2 no murmurs, no S3.  ABD: Soft non tender. Bowel sounds normal.  Ext: No edema  MS: Adequate ROM spine, shoulders, hips  and reduced in right  Knee with tenderness lateral aspect.  Skin: Intact, fungal rash in left armpit  Psych: Good eye contact, normal affect. Memory intact not anxious or depressed appearing.  CNS: CN 2-12 intact, power, tone and sensation normal throughout.       Assessment & Plan:

## 2012-12-31 NOTE — Assessment & Plan Note (Signed)
Controlled, no change in medication DASH diet and commitment to daily physical activity for a minimum of 30 minutes discussed and encouraged, as a part of hypertension management. The importance of attaining a healthy weight is also discussed.  

## 2013-02-17 ENCOUNTER — Ambulatory Visit: Payer: Self-pay | Admitting: Gynecologic Oncology

## 2013-04-15 ENCOUNTER — Ambulatory Visit: Payer: Self-pay | Admitting: Gynecologic Oncology

## 2013-05-07 LAB — CBC
HCT: 34.7 % — ABNORMAL LOW (ref 36.0–46.0)
MCV: 81.6 fL (ref 78.0–100.0)
Platelets: 305 10*3/uL (ref 150–400)
RBC: 4.25 MIL/uL (ref 3.87–5.11)
RDW: 15.8 % — ABNORMAL HIGH (ref 11.5–15.5)
WBC: 7.7 10*3/uL (ref 4.0–10.5)

## 2013-05-07 LAB — BASIC METABOLIC PANEL
Calcium: 9.4 mg/dL (ref 8.4–10.5)
Glucose, Bld: 105 mg/dL — ABNORMAL HIGH (ref 70–99)
Potassium: 4.7 mEq/L (ref 3.5–5.3)
Sodium: 141 mEq/L (ref 135–145)

## 2013-05-07 LAB — HEMOGLOBIN A1C: Mean Plasma Glucose: 128 mg/dL — ABNORMAL HIGH (ref <117)

## 2013-05-09 ENCOUNTER — Other Ambulatory Visit: Payer: Self-pay

## 2013-05-09 DIAGNOSIS — K219 Gastro-esophageal reflux disease without esophagitis: Secondary | ICD-10-CM

## 2013-05-09 MED ORDER — ROSUVASTATIN CALCIUM 10 MG PO TABS: 10.0000 mg | ORAL_TABLET | Freq: Every day | ORAL | Status: DC

## 2013-05-09 MED ORDER — OMEPRAZOLE 20 MG PO CPDR: 20.0000 mg | DELAYED_RELEASE_CAPSULE | Freq: Every day | ORAL | Status: DC

## 2013-05-09 MED ORDER — AMLODIPINE BESYLATE 10 MG PO TABS: 10.0000 mg | ORAL_TABLET | ORAL | Status: DC

## 2013-05-15 ENCOUNTER — Encounter: Payer: Self-pay | Admitting: Family Medicine

## 2013-05-15 ENCOUNTER — Ambulatory Visit (INDEPENDENT_AMBULATORY_CARE_PROVIDER_SITE_OTHER): Payer: Medicare HMO | Admitting: Family Medicine

## 2013-05-15 VITALS — BP 120/78 | HR 67 | Resp 16 | Ht 65.0 in | Wt 237.1 lb

## 2013-05-15 DIAGNOSIS — R7301 Impaired fasting glucose: Secondary | ICD-10-CM

## 2013-05-15 DIAGNOSIS — R7309 Other abnormal glucose: Secondary | ICD-10-CM

## 2013-05-15 DIAGNOSIS — R7303 Prediabetes: Secondary | ICD-10-CM

## 2013-05-15 DIAGNOSIS — R5383 Other fatigue: Secondary | ICD-10-CM

## 2013-05-15 DIAGNOSIS — Z0000 Encounter for general adult medical examination without abnormal findings: Secondary | ICD-10-CM | POA: Insufficient documentation

## 2013-05-15 DIAGNOSIS — E785 Hyperlipidemia, unspecified: Secondary | ICD-10-CM

## 2013-05-15 DIAGNOSIS — R5381 Other malaise: Secondary | ICD-10-CM

## 2013-05-15 DIAGNOSIS — Z0001 Encounter for general adult medical examination with abnormal findings: Secondary | ICD-10-CM | POA: Insufficient documentation

## 2013-05-15 MED ORDER — POLYETHYLENE GLYCOL 3350 17 GM/SCOOP PO POWD: ORAL | Status: DC

## 2013-05-15 NOTE — Patient Instructions (Addendum)
F/u in December, weight loss goal of 3 pounds per month  Call if you need me before  Flu vaccine will be due in October   Fasting lipid, cmp and hBA1C in December before visit  Increase exercise and cut back on food quantitiy

## 2013-05-15 NOTE — Progress Notes (Signed)
Subjective:    Patient ID: [name] [name], female    DOB: [birth year], 57 y.o.   MRN: [id number]  HPI  Preventive Screening-Counseling & Management   Patient present here today for a Medicare annual wellness visit.   Current Problems (verified)   Medications Prior to Visit Allergies (verified)   PAST HISTORY  Family History no dementia or depression, 2 half sibs, both parents living  Social History Widow x 7 years, Mom of 2 children, no alcohol, nicotine or street drugs Disability from textile industry in 1996   Risk Factors  Current exercise habits:  Three days per week, needs to increase to at least 5 days per week  Dietary issues discussed:low carb low fat diet   Cardiac risk factors:   Depression Screen  (Note: if answer to either of the following is "Yes", a more complete depression screening is indicated)   Over the past two weeks, have you felt down, depressed or hopeless? No  Over the past two weeks, have you felt little interest or pleasure in doing things? No  Have you lost interest or pleasure in daily life? No  Do you often feel hopeless? No  Do you cry easily over simple problems? No   Activities of Daily Living  In your present state of health, do you have any difficulty performing the following activities?  Driving?: No Managing money?: No Feeding yourself?:No Getting from bed to chair?:No Climbing a flight of stairs?:No Preparing food and eating?:No Bathing or showering?:No Getting dressed?:No Getting to the toilet?:No Using the toilet?:No Moving around from place to place?: No  Fall Risk Assessment In the past year have you fallen or had a near fall?yes, one slipped on snow Are you currently taking any medications that make you dizzy?:No   Hearing Difficulties: No Do you often ask people to speak up or repeat themselves?:No Do you experience ringing or noises in your ears?:No Do you have difficulty understanding soft or whispered  voices?:No  Cognitive Testing  Alert? Yes Normal Appearance?Yes  Oriented to person? Yes Place? Yes  Time? Yes  Displays appropriate judgment?Yes  Can read the correct time from a watch face? yes Are you having problems remembering things?No  Advanced Directives have been discussed with the patient?Yes ,. Full code   List the Names of Other Physician/Practitioners you currently use: Dr Hyacinth Meeker (gyne) and Dr Kary Kos any recent Medical Services you may have received from other than [facility] providers in the past year (date may be approximate).   Assessment:    Annual Wellness Exam   Plan:    During the course of the visit the patient was educated and counseled about appropriate screening and preventive services including:  A healthy diet is rich in fruit, vegetables and whole grains. Poultry fish, nuts and beans are a healthy choice for protein rather then red meat. A low sodium diet and drinking 64 ounces of water daily is generally recommended. Oils and sweet should be limited. Carbohydrates especially for those who are diabetic or overweight, should be limited to 30-45 gram per meal. It is important to eat on a regular schedule, at least 3 times daily. Snacks should be primarily fruits, vegetables or nuts. It is important that you exercise regularly at least 30 minutes 5 times a week. If you develop chest pain, have severe difficulty breathing, or feel very tired, stop exercising immediately and seek medical attention  Immunization reviewed and updated. Cancer screening reviewed and updated    Patient Instructions (  the written plan) was given to the patient.  Medicare Attestation  I have personally reviewed:  The patient's medical and social history  Their use of alcohol, tobacco or illicit drugs  Their current medications and supplements  The patient's functional ability including ADLs,fall risks, home safety risks, cognitive, and hearing and visual impairment  Diet and  physical activities  Evidence for depression or mood disorders  The patient's weight, height, BMI, and visual acuity have been recorded in the chart. I have made referrals, counseling, and provided education to the patient based on review of the above and I have provided the patient with a written personalized care plan for preventive services.   Gyne, Dr Hyacinth Meeker  Review of Systems     Objective:   Physical Exam        Assessment & Plan:

## 2013-05-16 ENCOUNTER — Ambulatory Visit: Payer: Self-pay | Admitting: Gynecologic Oncology

## 2013-05-18 NOTE — Assessment & Plan Note (Signed)
Annual wellness as documented. Pt is fully functional and able to take care of all her ADl's Depression, dementia screen are negative she has low fall risk currently Immunization and cancer screening are up to date, actually was diagnosed with and treated for endometrial cancer last year after an approx 1 year c/o abdominal pain Doing well recently saw gyne onc with no evidence of residual disease She is encouraged to commit to regular physical activity and reduce intake to promote weight loss

## 2013-05-21 ENCOUNTER — Other Ambulatory Visit: Payer: Self-pay | Admitting: Family Medicine

## 2013-06-03 ENCOUNTER — Other Ambulatory Visit: Payer: Self-pay | Admitting: Family Medicine

## 2013-06-03 DIAGNOSIS — Z139 Encounter for screening, unspecified: Secondary | ICD-10-CM

## 2013-06-10 ENCOUNTER — Ambulatory Visit (HOSPITAL_COMMUNITY)
Admission: RE | Admit: 2013-06-10 | Discharge: 2013-06-10 | Disposition: A | Discharge status: Home / Self Care | Payer: Medicare HMO | Source: Ambulatory Visit | Attending: Family Medicine | Admitting: Family Medicine

## 2013-06-10 DIAGNOSIS — Z1231 Encounter for screening mammogram for malignant neoplasm of breast: Secondary | ICD-10-CM | POA: Insufficient documentation

## 2013-06-10 DIAGNOSIS — Z139 Encounter for screening, unspecified: Secondary | ICD-10-CM

## 2013-07-25 IMAGING — US US CAROTID DUPLEX BILAT
1 series · 13 of 24 positions shown · non-contrast
Comparison: None

CLINICAL DATA: Evaluate bruit, recurrent lightheadedness, minimal
visual disturbances, history hypertension, diabetes,
hypercholesterolemia

BILATERAL CAROTID DUPLEX ULTRASOUND
TECHNIQUE: Gray scale imaging, color Doppler and duplex ultrasound
was performed of bilateral carotid and vertebral arteries in the
neck.

[Series 1: us carotid duplex bilat · 0.06mm/px · 13 of 70 slices shown]
[im 1/70]
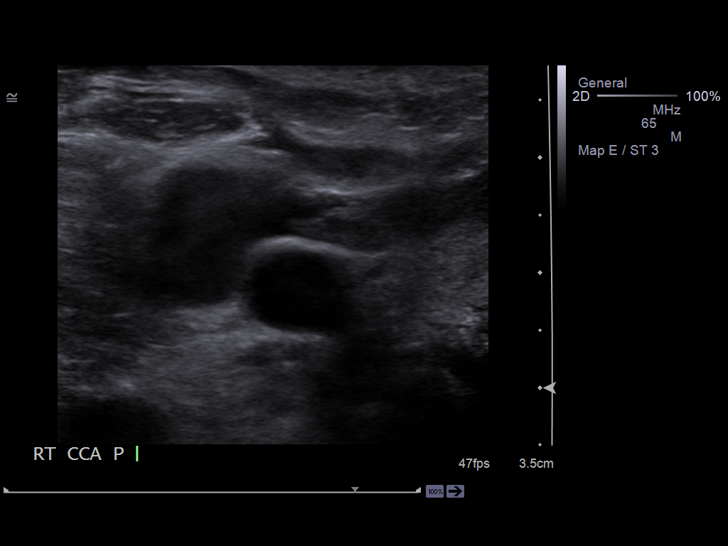
[im 7/70]
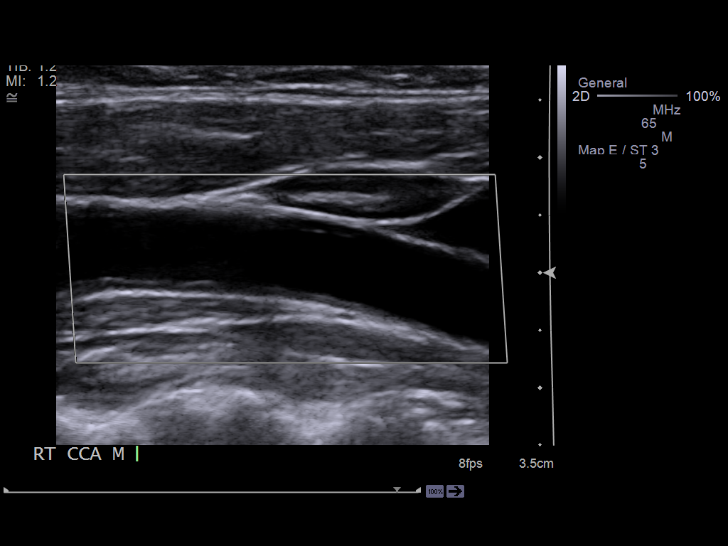
[im 13/70]
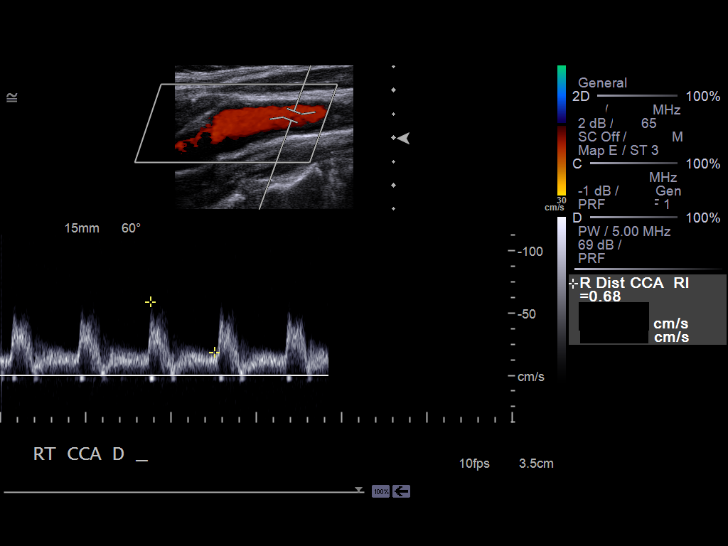
[im 19/70]
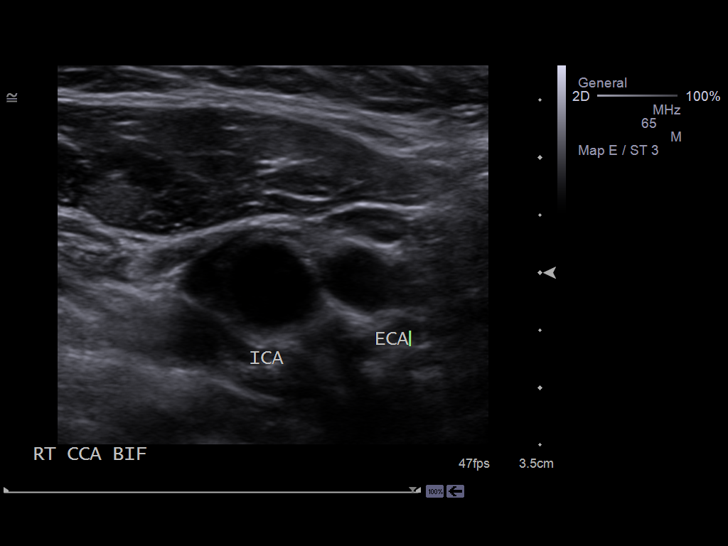
[im 25/70]
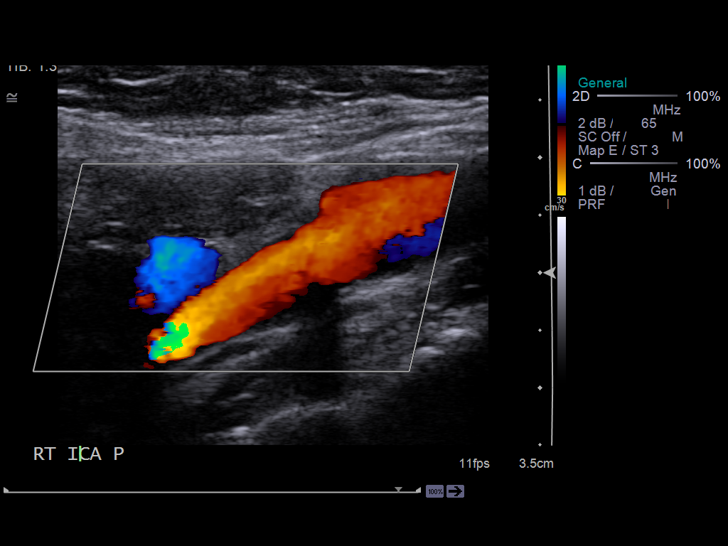
[im 31/70]
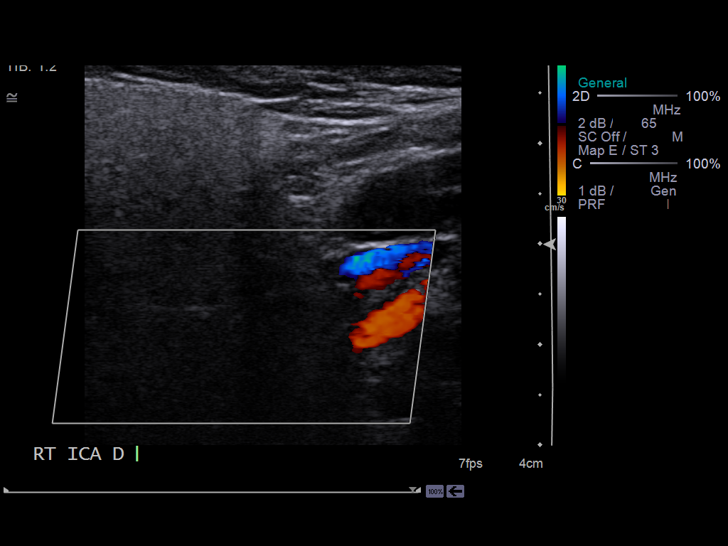
[im 37/70]
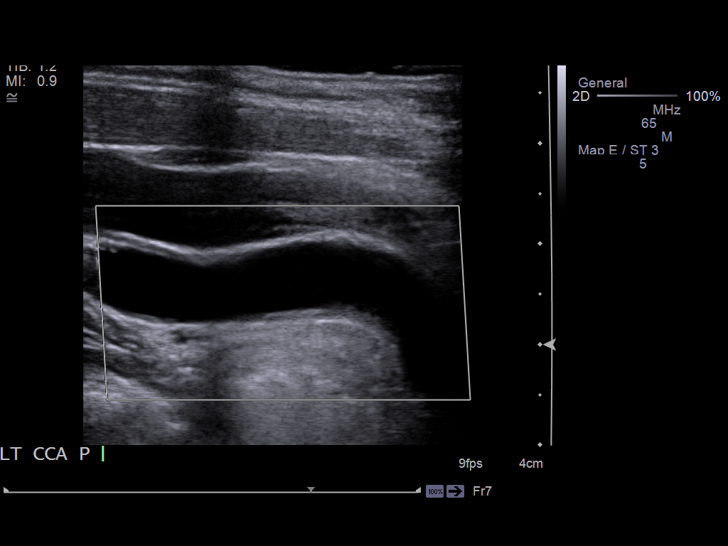
[im 40/70]
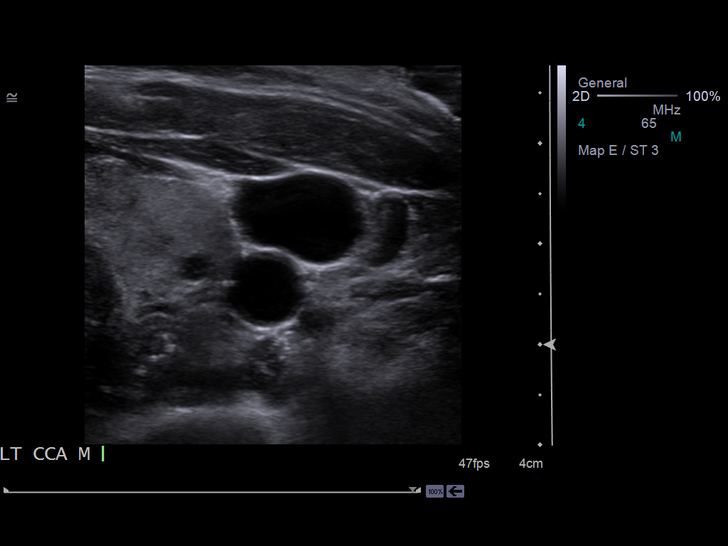
[im 46/70]
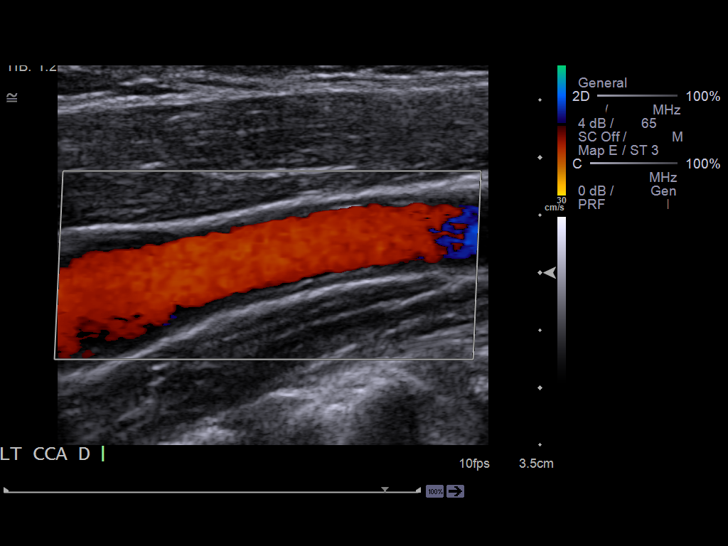
[im 52/70]
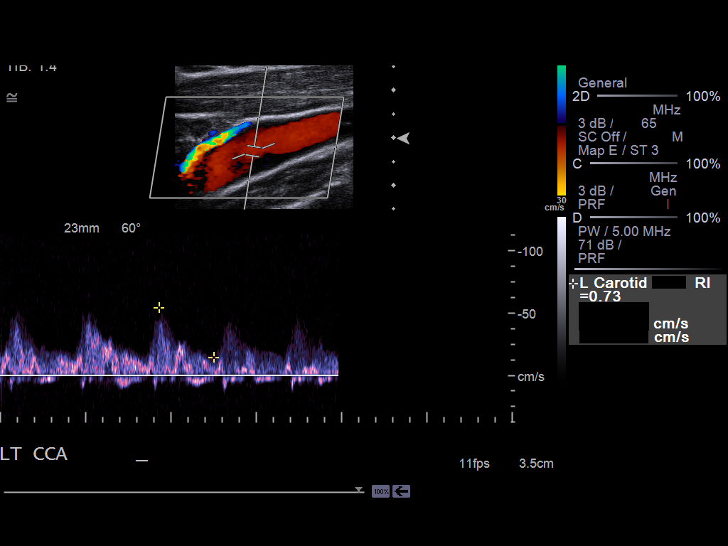
[im 58/70]
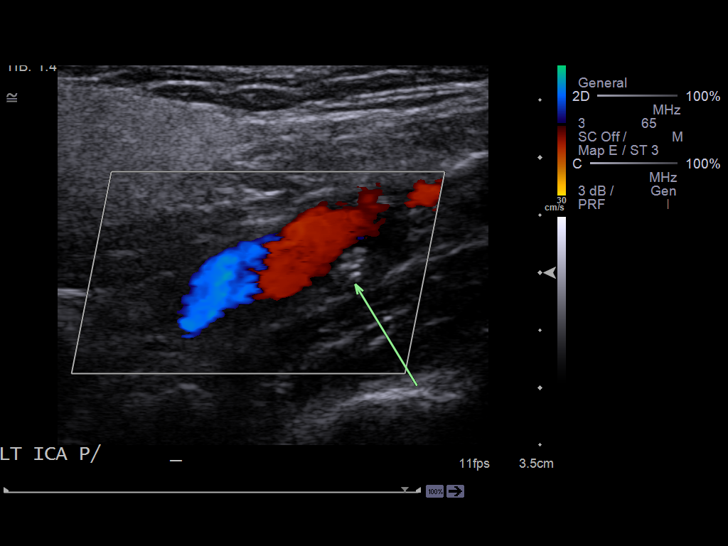
[im 64/70]
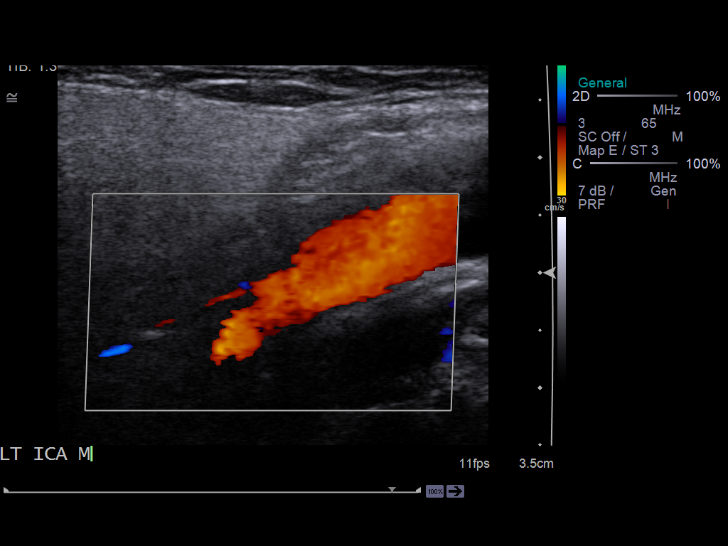
[im 70/70]
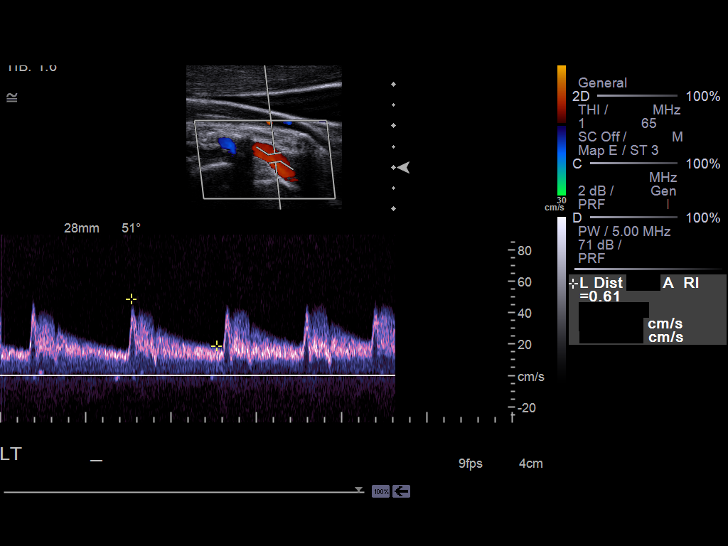

[13 of 24 positions shown; findings below may reference images not displayed]

Criteria:  Quantification of carotid stenosis is based on velocity
parameters that correlate the residual internal carotid diameter
with NASCET-based stenosis levels, using the diameter of the distal
internal carotid lumen as the denominator for stenosis measurement.

The following velocity measurements were obtained:

                 PEAK SYSTOLIC/END DIASTOLIC
RIGHT
ICA:                        79/31cm/sec
CCA:                        83/18cm/sec
SYSTOLIC ICA/CCA RATIO:
DIASTOLIC ICA/CCA RATIO:
ECA:                        77cm/sec

LEFT
ICA:                        70/26cm/sec
CCA:                        79/23cm/sec
SYSTOLIC ICA/CCA RATIO:
DIASTOLIC ICA/CCA RATIO:
ECA:                        70cm/sec
FINDINGS: RIGHT CAROTID ARTERY: Tortuous right carotid system.  Minimal
noncalcified plaque right CCA.  Additional noncalcified plaque
right carotid bulb into proximal right ICA.  Laminar flow by color
Doppler imaging.  Spectral broadening right ICA on waveform
analysis.  No high velocity jets.

RIGHT VERTEBRAL ARTERY:  Patent, antegrade

LEFT CAROTID ARTERY: Tortuous left carotid system.  Intimal
thickening left CCA.  Minimal noncalcified plaque left carotid bulb
into proximal left ICA.  Laminar flow on color Doppler imaging.
Small amount of echogenic plaque noted at origin of left ICA
without definite shadowing.  Spectral broadening proximal left ICA
on waveform analysis, distal to observed plaque.

LEFT VERTEBRAL ARTERY:  Patent, antegrade

Incidental findings:  Intermittently irregular heart rate
IMPRESSION: Minimal plaque formation at carotid bifurcations.
No evidence of hemodynamically significant stenosis.

## 2013-08-21 ENCOUNTER — Other Ambulatory Visit: Payer: Self-pay

## 2013-08-28 ENCOUNTER — Other Ambulatory Visit: Payer: Self-pay

## 2013-08-28 DIAGNOSIS — K219 Gastro-esophageal reflux disease without esophagitis: Secondary | ICD-10-CM

## 2013-08-28 MED ORDER — POLYETHYLENE GLYCOL 3350 17 GM/SCOOP PO POWD: ORAL | Status: DC

## 2013-08-28 MED ORDER — ROSUVASTATIN CALCIUM 10 MG PO TABS: 10.0000 mg | ORAL_TABLET | Freq: Every day | ORAL | Status: DC

## 2013-08-28 MED ORDER — OMEPRAZOLE 20 MG PO CPDR: 20.0000 mg | DELAYED_RELEASE_CAPSULE | Freq: Every day | ORAL | Status: DC

## 2013-08-28 MED ORDER — AMLODIPINE BESYLATE 10 MG PO TABS: 10.0000 mg | ORAL_TABLET | ORAL | Status: DC

## 2013-09-17 LAB — COMPREHENSIVE METABOLIC PANEL
ALT: 9 U/L (ref 0–35)
AST: 16 U/L (ref 0–37)
Albumin: 3.9 g/dL (ref 3.5–5.2)
Calcium: 9.4 mg/dL (ref 8.4–10.5)
Chloride: 105 mEq/L (ref 96–112)
Potassium: 4 mEq/L (ref 3.5–5.3)
Sodium: 138 mEq/L (ref 135–145)
Total Protein: 7.2 g/dL (ref 6.0–8.3)

## 2013-09-17 LAB — LIPID PANEL
Cholesterol: 117 mg/dL (ref 0–200)
HDL: 37 mg/dL — ABNORMAL LOW
LDL Cholesterol: 49 mg/dL (ref 0–99)
Total CHOL/HDL Ratio: 3.2 ratio
Triglycerides: 156 mg/dL — ABNORMAL HIGH
VLDL: 31 mg/dL (ref 0–40)

## 2013-09-24 ENCOUNTER — Ambulatory Visit (INDEPENDENT_AMBULATORY_CARE_PROVIDER_SITE_OTHER): Payer: Medicare HMO | Admitting: Family Medicine

## 2013-09-24 ENCOUNTER — Encounter: Payer: Self-pay | Admitting: Family Medicine

## 2013-09-24 VITALS — BP 118/74 | HR 73 | Resp 16 | Ht 65.0 in | Wt 236.1 lb

## 2013-09-24 DIAGNOSIS — M1711 Unilateral primary osteoarthritis, right knee: Secondary | ICD-10-CM

## 2013-09-24 DIAGNOSIS — R7309 Other abnormal glucose: Secondary | ICD-10-CM

## 2013-09-24 DIAGNOSIS — E669 Obesity, unspecified: Secondary | ICD-10-CM

## 2013-09-24 DIAGNOSIS — M545 Low back pain, unspecified: Secondary | ICD-10-CM

## 2013-09-24 DIAGNOSIS — M1710 Unilateral primary osteoarthritis, unspecified knee: Secondary | ICD-10-CM

## 2013-09-24 DIAGNOSIS — F411 Generalized anxiety disorder: Secondary | ICD-10-CM

## 2013-09-24 DIAGNOSIS — I10 Essential (primary) hypertension: Secondary | ICD-10-CM

## 2013-09-24 DIAGNOSIS — K219 Gastro-esophageal reflux disease without esophagitis: Secondary | ICD-10-CM

## 2013-09-24 DIAGNOSIS — K5909 Other constipation: Secondary | ICD-10-CM

## 2013-09-24 DIAGNOSIS — IMO0002 Reserved for concepts with insufficient information to code with codable children: Secondary | ICD-10-CM

## 2013-09-24 DIAGNOSIS — E785 Hyperlipidemia, unspecified: Secondary | ICD-10-CM

## 2013-09-24 DIAGNOSIS — R7303 Prediabetes: Secondary | ICD-10-CM

## 2013-09-24 MED ORDER — SPIRONOLACTONE 25 MG PO TABS: 25.0000 mg | ORAL_TABLET | ORAL | Status: DC

## 2013-09-24 MED ORDER — NABUMETONE 750 MG PO TABS: ORAL_TABLET | ORAL | Status: DC

## 2013-09-24 NOTE — Patient Instructions (Signed)
F/u in 3.5 month, call if  You need me before  You need to change eating as we discussed, so that you do not become a diabetic  Please commit to daily exercise and weight loss  HBA1C , chem 7 and EGFR  Fasting  In 3.5 month

## 2013-09-24 NOTE — Assessment & Plan Note (Signed)
Controlled, no change in medication DASH diet and commitment to daily physical activity for a minimum of 30 minutes discussed and encouraged, as a part of hypertension management. The importance of attaining a healthy weight is also discussed.  

## 2013-09-28 NOTE — Assessment & Plan Note (Addendum)
Continue mirilax and increase fiber in diet

## 2013-09-28 NOTE — Assessment & Plan Note (Signed)
Elevated TG and low HDL Pt encouraged to reduce fried and fatty food and commit to daily exercise

## 2013-09-28 NOTE — Assessment & Plan Note (Signed)
Deteriorated. Patient re-educated about  the importance of commitment to a  minimum of 150 minutes of exercise per week. The importance of healthy food choices with portion control discussed. Encouraged to start a food diary, count calories and to consider  joining a support group. Sample diet sheets offered. Goals set by the patient for the next several months.    

## 2013-09-28 NOTE — Assessment & Plan Note (Signed)
Controlled, no change in medication  

## 2013-09-28 NOTE — Progress Notes (Signed)
   Subjective:    Patient ID: [name] [name], female    DOB: [birth year], 57 y.o.   MRN: [id number]  HPI The PT is here for follow up and re-evaluation of chronic medical conditions, medication management and review of any available recent lab and radiology data.  Preventive health is updated, specifically  Cancer screening and Immunization.   Questions or concerns regarding consultations or procedures which the PT has had in the interim are  addressed. The PT denies any adverse reactions to current medications since the last visit.  There are no new concerns.  There are no specific complaints       Review of Systems See HPI Denies recent fever or chills. Denies sinus pressure, nasal congestion, ear pain or sore throat. Denies chest congestion, productive cough or wheezing. Denies chest pains, palpitations and leg swelling Denies abdominal pain, nausea, vomiting,diarrhea or constipation.   Denies dysuria, frequency, hesitancy or incontinence. Denies joint pain, swelling and limitation in mobility. Denies headaches, seizures, numbness, or tingling. Denies depression, anxiety or insomnia. Denies skin break down or rash.        Objective:   Physical Exam  Patient alert and oriented and in no cardiopulmonary distress.  HEENT: No facial asymmetry, EOMI, no sinus tenderness,  oropharynx pink and moist.  Neck supple no adenopathy.  Chest: Clear to auscultation bilaterally.  CVS: S1, S2 no murmurs, no S3.  ABD: Soft non tender. Bowel sounds normal.  Ext: No edema  MS: Adequate ROM spine, shoulders, hips and knees.  Skin: Intact, no ulcerations or rash noted.  Psych: Good eye contact, normal affect. Memory intact not anxious or depressed appearing.  CNS: CN 2-12 intact, power, tone and sensation normal throughout.       Assessment & Plan:

## 2013-09-28 NOTE — Assessment & Plan Note (Signed)
Deteriorated Patient educated about the importance of limiting  Carbohydrate intake , the need to commit to daily physical activity for a minimum of 30 minutes , and to commit weight loss. The fact that changes in all these areas will reduce or eliminate all together the development of diabetes is stressed.    

## 2013-09-28 NOTE — Assessment & Plan Note (Signed)
Unchanged, continue xanax at bedtime

## 2013-09-28 NOTE — Assessment & Plan Note (Signed)
No recent flare of pain, anti inflammatory as needed

## 2013-10-21 ENCOUNTER — Ambulatory Visit: Payer: Self-pay | Admitting: Gynecologic Oncology

## 2013-11-08 ENCOUNTER — Other Ambulatory Visit: Payer: Self-pay | Admitting: Family Medicine

## 2013-11-10 ENCOUNTER — Telehealth: Payer: Self-pay | Admitting: Family Medicine

## 2013-11-10 DIAGNOSIS — K219 Gastro-esophageal reflux disease without esophagitis: Secondary | ICD-10-CM

## 2013-11-10 MED ORDER — OMEPRAZOLE 20 MG PO CPDR: 20.0000 mg | DELAYED_RELEASE_CAPSULE | Freq: Every day | ORAL | Status: DC

## 2013-11-10 NOTE — Telephone Encounter (Signed)
I thought she got her meds through [facility]? Refilled to [facility] as requested

## 2013-11-16 ENCOUNTER — Ambulatory Visit: Payer: Self-pay | Admitting: Gynecologic Oncology

## 2013-11-25 ENCOUNTER — Other Ambulatory Visit: Payer: Self-pay

## 2013-11-25 DIAGNOSIS — I10 Essential (primary) hypertension: Secondary | ICD-10-CM

## 2013-11-25 MED ORDER — LOSARTAN POTASSIUM 50 MG PO TABS: ORAL_TABLET | ORAL | Status: DC

## 2013-11-27 ENCOUNTER — Other Ambulatory Visit: Payer: Self-pay

## 2013-11-27 DIAGNOSIS — I10 Essential (primary) hypertension: Secondary | ICD-10-CM

## 2013-11-27 MED ORDER — LOSARTAN POTASSIUM 50 MG PO TABS: ORAL_TABLET | ORAL | Status: DC

## 2014-01-30 ENCOUNTER — Other Ambulatory Visit: Payer: Self-pay | Admitting: Family Medicine

## 2014-01-31 LAB — COMPLETE METABOLIC PANEL WITH GFR
ALT: 9 U/L (ref 0–35)
AST: 17 U/L (ref 0–37)
Albumin: 3.9 g/dL (ref 3.5–5.2)
Alkaline Phosphatase: 129 U/L — ABNORMAL HIGH (ref 39–117)
BUN: 12 mg/dL (ref 6–23)
CO2: 27 mEq/L (ref 19–32)
Calcium: 9.2 mg/dL (ref 8.4–10.5)
Chloride: 100 mEq/L (ref 96–112)
Creat: 0.84 mg/dL (ref 0.50–1.10)
GFR, Est African American: 89 mL/min
GFR, Est Non African American: 77 mL/min
Glucose, Bld: 97 mg/dL (ref 70–99)
Potassium: 4.2 mEq/L (ref 3.5–5.3)
Sodium: 138 mEq/L (ref 135–145)
Total Bilirubin: 0.5 mg/dL (ref 0.2–1.2)
Total Protein: 7.1 g/dL (ref 6.0–8.3)

## 2014-01-31 LAB — HEMOGLOBIN A1C
Hgb A1c MFr Bld: 6.4 % — ABNORMAL HIGH (ref <5.7)
Mean Plasma Glucose: 137 mg/dL — ABNORMAL HIGH (ref <117)

## 2014-02-03 ENCOUNTER — Encounter: Payer: Self-pay | Admitting: Family Medicine

## 2014-02-03 ENCOUNTER — Ambulatory Visit (INDEPENDENT_AMBULATORY_CARE_PROVIDER_SITE_OTHER): Payer: Medicare HMO | Admitting: Family Medicine

## 2014-02-03 ENCOUNTER — Other Ambulatory Visit (HOSPITAL_COMMUNITY)
Admission: RE | Admit: 2014-02-03 | Discharge: 2014-02-03 | Disposition: A | Discharge status: Home / Self Care | Payer: Medicare HMO | Source: Ambulatory Visit | Attending: Family Medicine | Admitting: Family Medicine

## 2014-02-03 VITALS — BP 130/82 | HR 71 | Resp 16 | Ht 65.0 in | Wt 236.8 lb

## 2014-02-03 DIAGNOSIS — K219 Gastro-esophageal reflux disease without esophagitis: Secondary | ICD-10-CM

## 2014-02-03 DIAGNOSIS — R7303 Prediabetes: Secondary | ICD-10-CM

## 2014-02-03 DIAGNOSIS — F411 Generalized anxiety disorder: Secondary | ICD-10-CM

## 2014-02-03 DIAGNOSIS — R7309 Other abnormal glucose: Secondary | ICD-10-CM

## 2014-02-03 DIAGNOSIS — B356 Tinea cruris: Secondary | ICD-10-CM

## 2014-02-03 DIAGNOSIS — B3749 Other urogenital candidiasis: Secondary | ICD-10-CM

## 2014-02-03 DIAGNOSIS — Z113 Encounter for screening for infections with a predominantly sexual mode of transmission: Secondary | ICD-10-CM | POA: Insufficient documentation

## 2014-02-03 DIAGNOSIS — A6004 Herpesviral vulvovaginitis: Secondary | ICD-10-CM

## 2014-02-03 DIAGNOSIS — N760 Acute vaginitis: Secondary | ICD-10-CM

## 2014-02-03 DIAGNOSIS — I10 Essential (primary) hypertension: Secondary | ICD-10-CM

## 2014-02-03 DIAGNOSIS — E669 Obesity, unspecified: Secondary | ICD-10-CM

## 2014-02-03 DIAGNOSIS — E785 Hyperlipidemia, unspecified: Secondary | ICD-10-CM

## 2014-02-03 MED ORDER — FLUCONAZOLE 150 MG PO TABS: ORAL_TABLET | ORAL | Status: DC

## 2014-02-03 MED ORDER — TERBINAFINE HCL 1 % EX CREA: 1.0000 "application " | TOPICAL_CREAM | Freq: Two times a day (BID) | CUTANEOUS | Status: DC

## 2014-02-03 NOTE — Patient Instructions (Addendum)
F/u in 4 month, call if you need me before  New for vaginal itch is fluconazole 1 daily for 3 days and additional tests will be done , we will contact you with result.  New for groin itch is a cream to be used twice daily bP is good bnd blood sugar improved, keep it up  pls work on continued weight loss  Fasting lipid, cmp , hBa1C in 4 month  Alternate nabumetone with tylenol to lower potential kidney damamge and uncontrolled bP

## 2014-02-04 DIAGNOSIS — A6004 Herpesviral vulvovaginitis: Secondary | ICD-10-CM | POA: Insufficient documentation

## 2014-02-04 LAB — CERVICOVAGINAL ANCILLARY ONLY
Chlamydia: NEGATIVE
NEISSERIA GONORRHEA: NEGATIVE
WET PREP (BD AFFIRM): NEGATIVE
WET PREP (BD AFFIRM): NEGATIVE
Wet Prep (BD Affirm): NEGATIVE

## 2014-02-04 LAB — HSV 2 ANTIBODY, IGG: HSV 2 Glycoprotein G Ab, IgG: 11.34 IV — ABNORMAL HIGH

## 2014-02-05 ENCOUNTER — Other Ambulatory Visit: Payer: Self-pay

## 2014-02-05 DIAGNOSIS — A6004 Herpesviral vulvovaginitis: Secondary | ICD-10-CM

## 2014-02-05 MED ORDER — ACYCLOVIR 400 MG PO TABS: 400.0000 mg | ORAL_TABLET | Freq: Three times a day (TID) | ORAL | Status: DC

## 2014-02-08 NOTE — Assessment & Plan Note (Signed)
Slightly improved HBA1C , pt applauded on this and encouraged to continue same. Patient educated about the importance of limiting  Carbohydrate intake , the need to commit to daily physical activity for a minimum of 30 minutes , and to commit weight loss. The fact that changes in all these areas will reduce or eliminate all together the development of diabetes is stressed.

## 2014-02-08 NOTE — Assessment & Plan Note (Signed)
Controlled, no change in medication  

## 2014-02-08 NOTE — Assessment & Plan Note (Signed)
Fungal infection noted in inguinal crease, topical cream prescribed

## 2014-02-08 NOTE — Progress Notes (Signed)
Subjective:    Patient ID: [name] [name], female    DOB: 1956/10/05, 58 y.o.   MRN: [id number]  HPI The PT is here for follow up and re-evaluation of chronic medical conditions, medication management and review of any available recent lab and radiology data.  Preventive health is updated, specifically  Cancer screening and Immunization.   Questions or concerns regarding consultations or procedures which the PT has had in the interim are  Addressed.Saw her  Gyne oncologist earlier this year and all is well The PT denies any adverse reactions to current medications since the last visit.  C/o significant vaginal itch which she has had intermittently for years, very bothersome in the past few weeks , wants treatment for this Has worked on diet and increased activity, needs to do  Better with both but blood sugar is improved , which is great       Review of Systems See HPI Denies recent fever or chills. Denies sinus pressure, nasal congestion, ear pain or sore throat. Denies chest congestion, productive cough or wheezing. Denies chest pains, palpitations and leg swelling Denies abdominal pain, nausea, vomiting,diarrhea or constipation.   Denies dysuria, frequency, hesitancy or incontinence. Denies joint pain, swelling and limitation in mobility. Denies headaches, seizures, numbness, or tingling. Denies depression, anxiety or insomnia. Denies skin break down or rash.        Objective:   Physical Exam  BP 130/82  Pulse 71  Resp 16  Ht 5\' 5"  (1.651 m)  Wt 236 lb 12.8 oz (107.412 kg)  BMI 39.41 kg/m2  SpO2 100%  LMP 06/03/2012 Patient alert and oriented and in no cardiopulmonary distress.  HEENT: No facial asymmetry, EOMI, no sinus tenderness,  oropharynx pink and moist.  Neck supple no adenopathy.  Chest: Clear to auscultation bilaterally.  CVS: S1, S2 no murmurs, no S3.  ABD: Soft non tender. Bowel sounds normal. Pelvic tine cruris present in both inguinal creases,  scant white vaginal d/c seen, no associated odor. No ulcers seen in vaginal canal or in perineal area, mild eryhtema  Noted presumaby due to excessive irritation. Uterus absent, no adnexal masses palpated Ext: No edema  MS: Adequate ROM spine, shoulders, hips and knees.  Skin: tinea cruris present bilaterally, no skin breakdown or ulceration noted  Psych: Good eye contact, normal affect. Memory intact not anxious or depressed appearing.  CNS: CN 2-12 intact, power, tone and sensation normal throughout.       Assessment & Plan:  Vaginitis and vulvovaginitis Severe pruritus intermittently for months/years specimens sent , also hSV2  Serology done will f/u on both No ulcers on inspection no significant discharge or malodor, specimns are sent , presumptive treatment sent in for BV and yeastt  HTN, goal below 130/80 Controlled, no change in medication DASH diet and commitment to daily physical activity for a minimum of 30 minutes discussed and encouraged, as a part of hypertension management. The importance of attaining a healthy weight is also discussed.   Prediabetes Slightly improved HBA1C , pt applauded on this and encouraged to continue same. Patient educated about the importance of limiting  Carbohydrate intake , the need to commit to daily physical activity for a minimum of 30 minutes , and to commit weight loss. The fact that changes in all these areas will reduce or eliminate all together the development of diabetes is stressed.      HYPERLIPIDEMIA Hyperlipidemia:Low fat diet discussed and encouraged.  Updated lab needed at/ before next visit.  Obesity (BMI 30-39.9) Unchanged Patient re-educated about  the importance of commitment to a  minimum of 150 minutes of exercise per week. The importance of healthy food choices with portion control discussed. Encouraged to start a food diary, count calories and to consider  joining a support group. Sample diet sheets  offered. Goals set by the patient for the next several months.     ANXIETY Controlled, no change in medication   Tinea cruris Fungal infection noted in inguinal crease, topical cream prescribed  GERD Controlled, no change in medication

## 2014-02-08 NOTE — Assessment & Plan Note (Signed)
Hyperlipidemia:Low fat diet discussed and encouraged.  Updated lab needed at/ before next visit.  

## 2014-02-08 NOTE — Assessment & Plan Note (Signed)
Severe pruritus intermittently for months/years specimens sent , also hSV2  Serology done will f/u on both No ulcers on inspection no significant discharge or malodor, specimns are sent , presumptive treatment sent in for BV and yeastt

## 2014-02-08 NOTE — Assessment & Plan Note (Signed)
Unchanged. Patient re-educated about  the importance of commitment to a  minimum of 150 minutes of exercise per week. The importance of healthy food choices with portion control discussed. Encouraged to start a food diary, count calories and to consider  joining a support group. Sample diet sheets offered. Goals set by the patient for the next several months.    

## 2014-02-08 NOTE — Assessment & Plan Note (Signed)
Controlled, no change in medication DASH diet and commitment to daily physical activity for a minimum of 30 minutes discussed and encouraged, as a part of hypertension management. The importance of attaining a healthy weight is also discussed.  

## 2014-02-08 NOTE — Assessment & Plan Note (Signed)
New diagnosis confirmed by blood work done on day of exam, acyclovir prescribed for 10 days and pt educated about the disease, she was thankful to be getting the needed treatment, and her she is a widow and not currently sexually active

## 2014-02-13 IMAGING — CR DG ABDOMEN 1V
2 series · 2 of 2 positions shown · non-contrast
Comparison: CT 09/19/2011.

CLINICAL DATA: Pain in the mid abdomen.  Post cholecystectomy by
history.  History of umbilical hernia repair.

ABDOMEN - 1 VIEW

[view not recorded (1 of 2)]
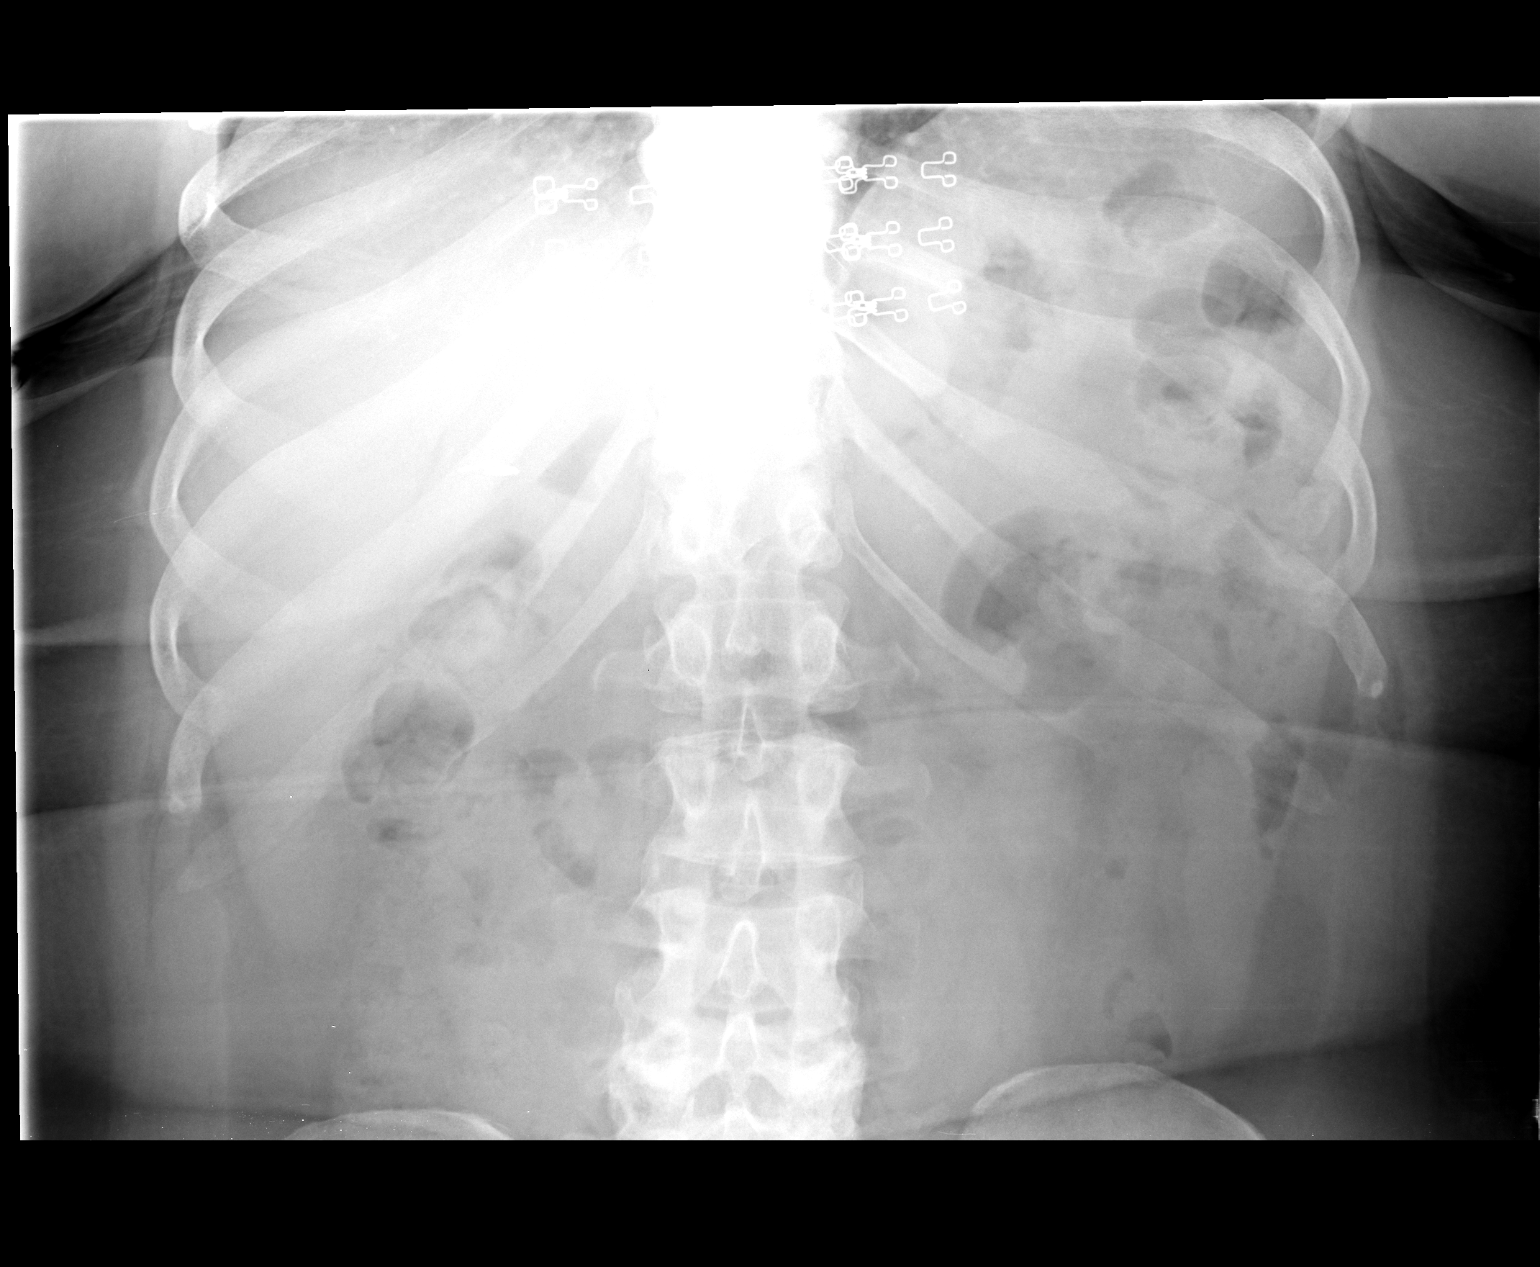

[view not recorded (2 of 2)]
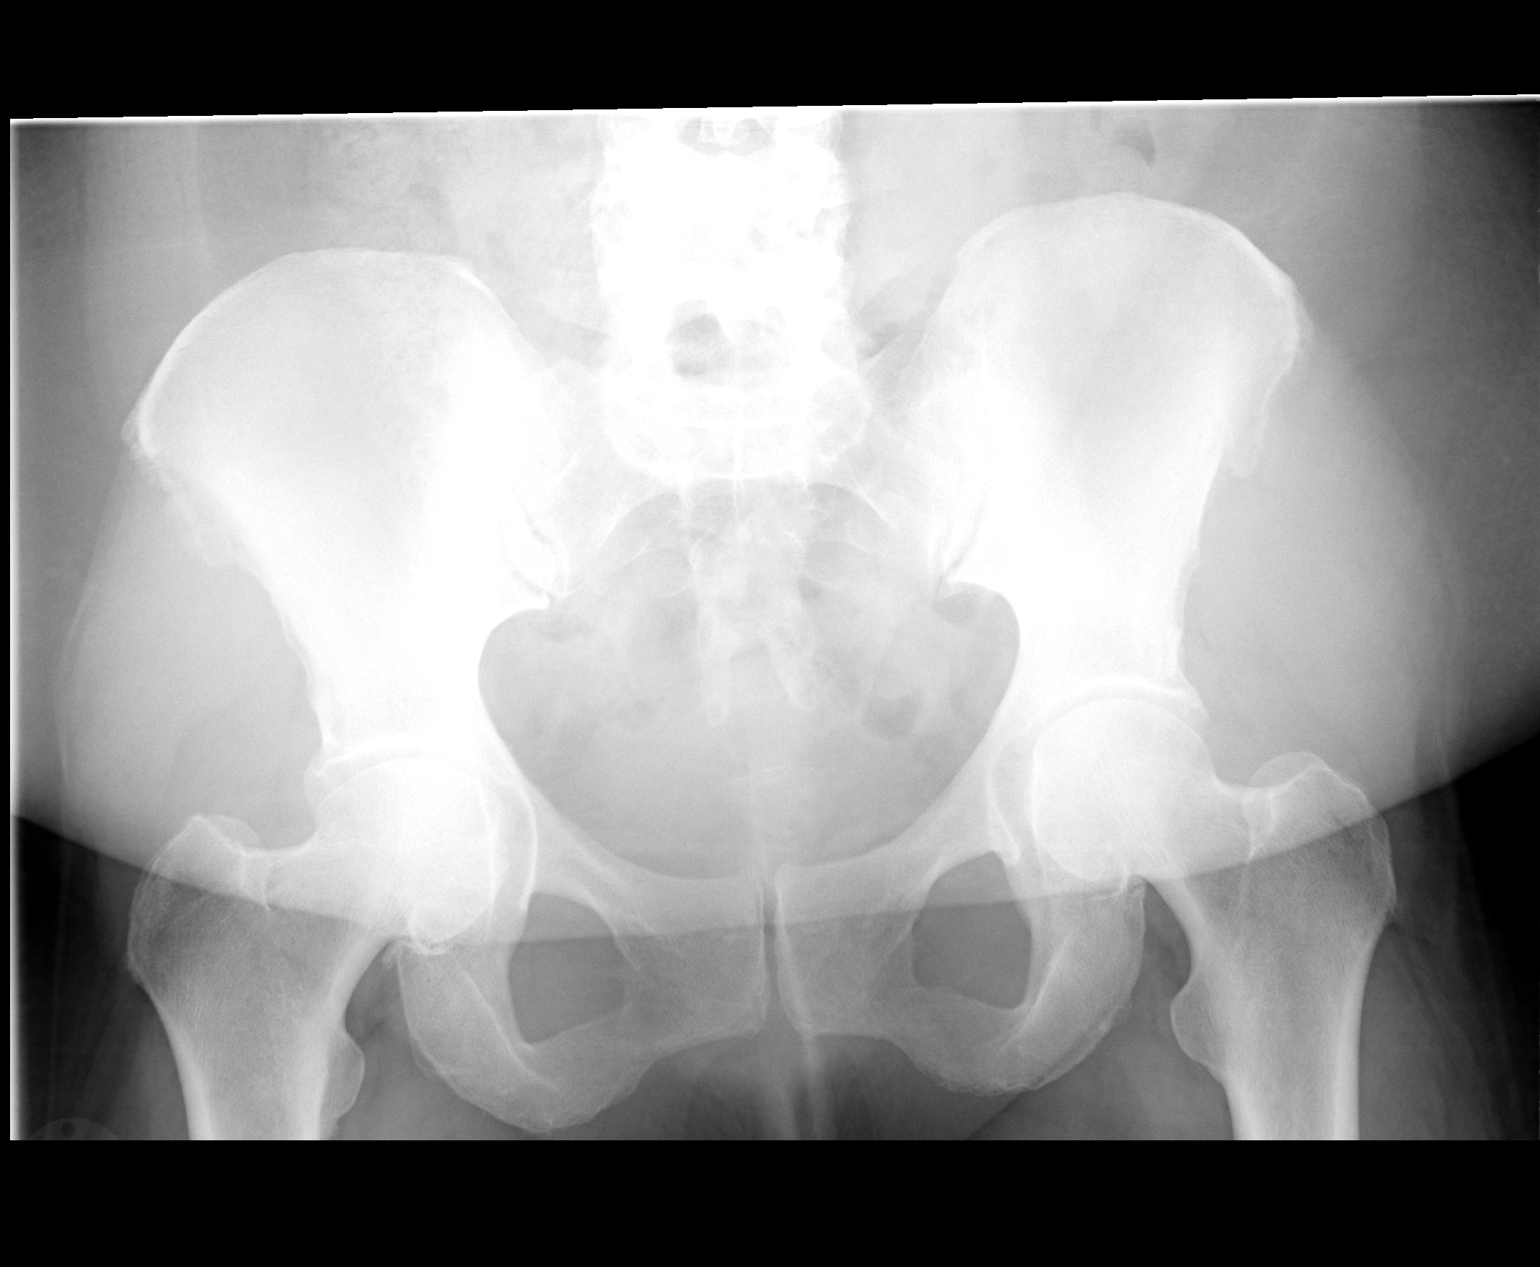

[2 of 2 positions shown; findings below may reference images not displayed]

FINDINGS: Cholecystectomy clips are seen.  There is moderate fecal
distention of portions of the colon.  Small bowel pattern is
nonobstructive.  No opaque calculi are seen.  There is minimal
degenerative spondylosis.
IMPRESSION: Moderate fecal distention of portions of the colon.  No opaque
calculi.  Post cholecystectomy.

## 2014-03-12 ENCOUNTER — Encounter: Payer: Self-pay | Admitting: *Deleted

## 2014-03-18 ENCOUNTER — Encounter: Payer: Self-pay | Admitting: Family Medicine

## 2014-03-18 ENCOUNTER — Ambulatory Visit (INDEPENDENT_AMBULATORY_CARE_PROVIDER_SITE_OTHER): Payer: Commercial Managed Care - HMO | Admitting: Family Medicine

## 2014-03-18 VITALS — BP 120/78 | HR 76 | Temp 97.8°F | Resp 16 | Ht 65.0 in | Wt 234.0 lb

## 2014-03-18 DIAGNOSIS — E785 Hyperlipidemia, unspecified: Secondary | ICD-10-CM

## 2014-03-18 DIAGNOSIS — N760 Acute vaginitis: Secondary | ICD-10-CM

## 2014-03-18 DIAGNOSIS — E669 Obesity, unspecified: Secondary | ICD-10-CM

## 2014-03-18 DIAGNOSIS — R7303 Prediabetes: Secondary | ICD-10-CM

## 2014-03-18 DIAGNOSIS — I10 Essential (primary) hypertension: Secondary | ICD-10-CM

## 2014-03-18 DIAGNOSIS — R7309 Other abnormal glucose: Secondary | ICD-10-CM

## 2014-03-18 MED ORDER — TRIAMCINOLONE ACETONIDE 0.1 % EX CREA: 1.0000 "application " | TOPICAL_CREAM | Freq: Two times a day (BID) | CUTANEOUS | Status: DC

## 2014-03-18 MED ORDER — FLUCONAZOLE 150 MG PO TABS: ORAL_TABLET | ORAL | Status: DC

## 2014-03-18 NOTE — Assessment & Plan Note (Signed)
Plan for cholesterol check before next visit continue Crestor

## 2014-03-18 NOTE — Assessment & Plan Note (Signed)
Blood pressure is well-controlled no change the medications 

## 2014-03-18 NOTE — Assessment & Plan Note (Signed)
Diet control and working on exercise and weight loss. Review last A1c which looks good no medications at this time we will recheck her A1c prior to her next visit

## 2014-03-18 NOTE — Patient Instructions (Signed)
Continue current medication Apply the cream to the vulva region for itching Take the yeast pill F/U 3 months , get the labs done 1 day

## 2014-03-18 NOTE — Progress Notes (Signed)
Patient ID: [name] [name], female   DOB: [birth year], 58 y.o.   MRN: [id number]   Subjective:    Patient ID: [name] [name], female    DOB: 07/01/1956, 58 y.o.   MRN: [id number]  Patient presents for [place] CPE and Itching on outside of vulva  patient here to establish care. She was actually previous patient at my previous office however I do not see her. She's a history of prediabetes hypertension TIA. She was followed by cardiology in the past and she was having some palpitations and chest pain GEN normal his cardiac stress test back in 2013 has not had any followup since then. She's been trying to monitor her diabetes and hypertension and is increased her activity by joining the [employer] program. She's no longer on oral diabetic medication. She does take her blood pressure medication as well as her lipid medication.  She also has a history of insomnia and she's currently on Xanax at bedtime and does well with this.  Vaginal itching she gets vaginal itching on and off. She was last seen by her previous PCP in April at that time she was given Diflucan which actually cleared up her symptoms however HSV titer was also done because she complained of some tingling there no rash and was positive for HSV 2. She was prescribed acyclovir but she never took the medication she's been very concerned about this diagnosis as she is seeing multiple GYNs including her own and they have never diagnosed her with herpes virus. She states that she was treated for the vaginal itching in the past by her GYN on multiple occasions with gentian violet blue stain as well as antifungals which have cleared up in the past She has an appointment with her GYN in a couple weeks will like me to see the area that is itching. Note no vaginal discharge or vaginal bleeding   Review Of Systems:  GEN- denies fatigue, fever, weight loss,weakness, recent illness HEENT- denies eye drainage, change in vision, nasal  discharge, CVS- denies chest pain, palpitations RESP- denies SOB, cough, wheeze ABD- denies N/V, change in stools, abd pain GU- denies dysuria, hematuria, dribbling, incontinence MSK- denies joint pain, muscle aches, injury Neuro- denies headache, dizziness, syncope, seizure activity       Objective:    BP 120/78  Pulse 76  Temp(Src) 97.8 F (36.6 C) (Oral)  Resp 16  Ht 5\' 5"  (1.651 m)  Wt 234 lb (106.142 kg)  BMI 38.94 kg/m2  LMP 06/03/2012 GEN- NAD, alert and oriented x3 HEENT- PERRL, EOMI, non injected sclera, pink conjunctiva, MMM, oropharynx clear Neck- Supple, no thyromegaly CVS- RRR, no murmur RESP-CTAB ABD-NABS,soft,NT,ND GU- normal external genitalia, skin- hypopigmentation scattered with mild erythema of labia majora vaginal mucosa pink and moist,  EXT- No edema Pulses- Radial, DP- 2+        Assessment & Plan:      Problem List Items Addressed This Visit   None      Note: This dictation was prepared with Dragon dictation along with smaller phrase technology. Any transcriptional errors that result from this process are unintentional.

## 2014-03-18 NOTE — Assessment & Plan Note (Signed)
She does have positive HSV titers and I explained to her what this means. Based on the examination this does not look like herpes outbreak. I queried the diagnosis of lichen sclerosus in her. The oral antifungal did work in the past I will prescribe the Diflucan again. I will also have her try triamcinolone to the labia to the area of the hypopigmentation and itching. She will followup with her gynecologist in a few weeks. I think we can hold on the acyclovir

## 2014-04-08 ENCOUNTER — Other Ambulatory Visit: Payer: Self-pay | Admitting: Family Medicine

## 2014-04-10 ENCOUNTER — Other Ambulatory Visit: Payer: Self-pay | Admitting: *Deleted

## 2014-04-10 DIAGNOSIS — M545 Low back pain: Secondary | ICD-10-CM

## 2014-04-10 MED ORDER — POLYETHYLENE GLYCOL 3350 17 GM/SCOOP PO POWD: ORAL | Status: DC

## 2014-04-10 MED ORDER — NABUMETONE 750 MG PO TABS: ORAL_TABLET | ORAL | Status: DC

## 2014-04-10 MED ORDER — SPIRONOLACTONE 25 MG PO TABS: 25.0000 mg | ORAL_TABLET | ORAL | Status: DC

## 2014-04-10 NOTE — Telephone Encounter (Signed)
Refill appropriate and filled per protocol. 

## 2014-05-25 ENCOUNTER — Other Ambulatory Visit: Payer: Self-pay | Admitting: Family Medicine

## 2014-05-25 DIAGNOSIS — Z1231 Encounter for screening mammogram for malignant neoplasm of breast: Secondary | ICD-10-CM

## 2014-06-12 ENCOUNTER — Ambulatory Visit: Payer: Medicare HMO | Admitting: Family Medicine

## 2014-06-15 ENCOUNTER — Ambulatory Visit (HOSPITAL_COMMUNITY)
Admission: RE | Admit: 2014-06-15 | Discharge: 2014-06-15 | Disposition: A | Discharge status: Home / Self Care | Payer: Medicare HMO | Source: Ambulatory Visit | Attending: Family Medicine | Admitting: Family Medicine

## 2014-06-15 DIAGNOSIS — Z1231 Encounter for screening mammogram for malignant neoplasm of breast: Secondary | ICD-10-CM | POA: Insufficient documentation

## 2014-06-24 ENCOUNTER — Ambulatory Visit: Payer: Commercial Managed Care - HMO | Admitting: Family Medicine

## 2014-07-21 ENCOUNTER — Other Ambulatory Visit: Payer: Commercial Managed Care - HMO

## 2014-07-21 DIAGNOSIS — E559 Vitamin D deficiency, unspecified: Secondary | ICD-10-CM

## 2014-07-21 DIAGNOSIS — E785 Hyperlipidemia, unspecified: Secondary | ICD-10-CM

## 2014-07-21 DIAGNOSIS — I10 Essential (primary) hypertension: Secondary | ICD-10-CM

## 2014-07-21 DIAGNOSIS — R7303 Prediabetes: Secondary | ICD-10-CM

## 2014-07-21 LAB — COMPLETE METABOLIC PANEL WITH GFR
ALT: 11 U/L (ref 0–35)
AST: 19 U/L (ref 0–37)
Albumin: 4 g/dL (ref 3.5–5.2)
Alkaline Phosphatase: 131 U/L — ABNORMAL HIGH (ref 39–117)
BUN: 17 mg/dL (ref 6–23)
CALCIUM: 9.2 mg/dL (ref 8.4–10.5)
CO2: 32 mEq/L (ref 19–32)
CREATININE: 0.98 mg/dL (ref 0.50–1.10)
Chloride: 100 mEq/L (ref 96–112)
GFR, Est African American: 73 mL/min
GFR, Est Non African American: 63 mL/min
Glucose, Bld: 111 mg/dL — ABNORMAL HIGH (ref 70–99)
Potassium: 4.4 mEq/L (ref 3.5–5.3)
Sodium: 138 mEq/L (ref 135–145)
Total Bilirubin: 0.6 mg/dL (ref 0.2–1.2)
Total Protein: 7.4 g/dL (ref 6.0–8.3)

## 2014-07-21 LAB — CBC WITH DIFFERENTIAL/PLATELET
Basophils Absolute: 0.1 10*3/uL (ref 0.0–0.1)
Basophils Relative: 1 % (ref 0–1)
EOS ABS: 0.3 10*3/uL (ref 0.0–0.7)
EOS PCT: 5 % (ref 0–5)
HCT: 33.5 % — ABNORMAL LOW (ref 36.0–46.0)
HEMOGLOBIN: 10.9 g/dL — AB (ref 12.0–15.0)
Lymphocytes Relative: 27 % (ref 12–46)
Lymphs Abs: 1.8 10*3/uL (ref 0.7–4.0)
MCH: 26.7 pg (ref 26.0–34.0)
MCHC: 32.5 g/dL (ref 30.0–36.0)
MCV: 82.1 fL (ref 78.0–100.0)
MONOS PCT: 8 % (ref 3–12)
Monocytes Absolute: 0.5 10*3/uL (ref 0.1–1.0)
Neutro Abs: 3.8 10*3/uL (ref 1.7–7.7)
Neutrophils Relative %: 59 % (ref 43–77)
PLATELETS: 237 10*3/uL (ref 150–400)
RBC: 4.08 MIL/uL (ref 3.87–5.11)
RDW: 15.1 % (ref 11.5–15.5)
WBC: 6.5 10*3/uL (ref 4.0–10.5)

## 2014-07-21 LAB — HEMOGLOBIN A1C
Hgb A1c MFr Bld: 6.6 % — ABNORMAL HIGH (ref <5.7)
Mean Plasma Glucose: 143 mg/dL — ABNORMAL HIGH (ref <117)

## 2014-07-21 LAB — LIPID PANEL
Cholesterol: 124 mg/dL (ref 0–200)
HDL: 48 mg/dL (ref >39)
LDL CALC: 55 mg/dL (ref 0–99)
Total CHOL/HDL Ratio: 2.6 Ratio
Triglycerides: 103 mg/dL (ref <150)
VLDL: 21 mg/dL (ref 0–40)

## 2014-07-22 ENCOUNTER — Encounter: Payer: Self-pay | Admitting: Family Medicine

## 2014-07-22 ENCOUNTER — Ambulatory Visit (INDEPENDENT_AMBULATORY_CARE_PROVIDER_SITE_OTHER): Payer: Commercial Managed Care - HMO | Admitting: Family Medicine

## 2014-07-22 VITALS — BP 128/72 | HR 64 | Temp 98.3°F | Resp 16 | Ht 62.0 in | Wt 233.0 lb

## 2014-07-22 DIAGNOSIS — E785 Hyperlipidemia, unspecified: Secondary | ICD-10-CM

## 2014-07-22 DIAGNOSIS — R490 Dysphonia: Secondary | ICD-10-CM | POA: Insufficient documentation

## 2014-07-22 DIAGNOSIS — Z23 Encounter for immunization: Secondary | ICD-10-CM

## 2014-07-22 DIAGNOSIS — R7309 Other abnormal glucose: Secondary | ICD-10-CM

## 2014-07-22 DIAGNOSIS — I10 Essential (primary) hypertension: Secondary | ICD-10-CM

## 2014-07-22 DIAGNOSIS — F411 Generalized anxiety disorder: Secondary | ICD-10-CM

## 2014-07-22 DIAGNOSIS — R7303 Prediabetes: Secondary | ICD-10-CM

## 2014-07-22 LAB — VITAMIN D 25 HYDROXY (VIT D DEFICIENCY, FRACTURES): Vit D, 25-Hydroxy: 55 ng/mL (ref 30–89)

## 2014-07-22 MED ORDER — PREDNISONE 20 MG PO TABS: 20.0000 mg | ORAL_TABLET | Freq: Every day | ORAL | Status: DC

## 2014-07-22 MED ORDER — ALPRAZOLAM 1 MG PO TABS: ORAL_TABLET | ORAL | Status: DC

## 2014-07-22 NOTE — Progress Notes (Signed)
Patient ID: [name] [name], female   DOB: 01-31-1956, 58 y.o.   MRN: [id number]   Subjective:    Patient ID: [name] [name], female    DOB: 28-Jul-1956, 58 y.o.   MRN: [id number]  Patient presents for 3 month F/U, Medication Review/ Refill and Hoarseness  patient here for followup of chronic medical problems. Her father passed away in 06-24-23 she's been having difficulty sleeping and her nerves have been bad some days. She's now taking care of her mother as she lives with her parents who is 59 years old and has taken the loss very hard. She is on Xanax 0.25 mg however she did take Xanax 1 mg tablet and this did better for her she would like to increase her medication if possible.  She's felt worse voice for the past 4 weeks she states that she stayed at her father's funeral and since then her voice has not come back in. She's not had any sinus drainage or pressure she initially had sore throat but this is now resolved.  Hypertension pre- diabetes her fasting labs were reviewed    Review Of Systems:  GEN- denies fatigue, fever, weight loss,weakness, recent illness HEENT- denies eye drainage, change in vision, nasal discharge, CVS- denies chest pain, palpitations RESP- denies SOB, cough, wheeze ABD- denies N/V, change in stools, abd pain GU- denies dysuria, hematuria, dribbling, incontinence MSK- denies joint pain, muscle aches, injury Neuro- denies headache, dizziness, syncope, seizure activity       Objective:    BP 128/72  Pulse 64  Temp(Src) 98.3 F (36.8 C) (Oral)  Resp 16  Ht 5\' 2"  (1.575 m)  Wt 233 lb (105.688 kg)  BMI 42.61 kg/m2  LMP 06/03/2012 GEN- NAD, alert and oriented x3, hoarse voice HEENT- PERRL, EOMI, non injected sclera, pink conjunctiva, MMM, oropharynx clear, nares clear, TM clear no effusion, canals clear Neck- Supple, no thyromegaly, no LAD CVS- RRR, no murmur RESP-CTAB Psych- normal affect and mood, no SI EXT- Trace right pedal  edema Pulses- Radial,  DP- 2+        Assessment & Plan:      Problem List Items Addressed This Visit   Prediabetes     A1c up at 6.6%, discussed dietary changes and weight loss, will not start metformin, will recheck at next visit, if she trends up start meds    Hyperlipidemia     Cholesterol at goal no change to crestor    HTN, goal below 130/80     Bp looks good, no changes    Hoarse voice quality     Hoarse voice for past 4 weeks, will give trial of low dose prednisone, salt water gargle On PPI No improvement in 2 weeks, refer to ENT    Anxiety state     Worsened recently with passing of father, xanax increased to 1mg , this will help with sleep as well    Relevant Medications      ALPRAZolam (XANAX) tablet    Other Visit Diagnoses   Need for prophylactic vaccination and inoculation against influenza    -  Primary    Relevant Orders       Flu Vaccine QUAD 36+ mos PF IM (Fluarix Quad PF) (Completed)       Note: This dictation was prepared with Dragon dictation along with smaller phrase technology. Any transcriptional errors that result from this process are unintentional.

## 2014-07-22 NOTE — Assessment & Plan Note (Signed)
Worsened recently with passing of father, xanax increased to 1mg , this will help with sleep as well

## 2014-07-22 NOTE — Patient Instructions (Signed)
Xanax changed to 1mg  Take the steroids Call if voice does not come in  F/U 3 months

## 2014-07-22 NOTE — Assessment & Plan Note (Signed)
Hoarse voice for past 4 weeks, will give trial of low dose prednisone, salt water gargle On PPI No improvement in 2 weeks, refer to ENT

## 2014-07-22 NOTE — Assessment & Plan Note (Signed)
Cholesterol at goal no change to crestor

## 2014-07-22 NOTE — Assessment & Plan Note (Signed)
Bp looks good, no changes

## 2014-07-22 NOTE — Assessment & Plan Note (Signed)
A1c up at 6.6%, discussed dietary changes and weight loss, will not start metformin, will recheck at next visit, if she trends up start meds

## 2014-07-26 IMAGING — CR DG CHEST 2V
2 series · 2 of 2 positions shown · non-contrast
Comparison: Chest x-ray of 01/25/2012

CLINICAL DATA: Chest pain, weakness

CHEST - 2 VIEW

[view not recorded (1 of 2)]
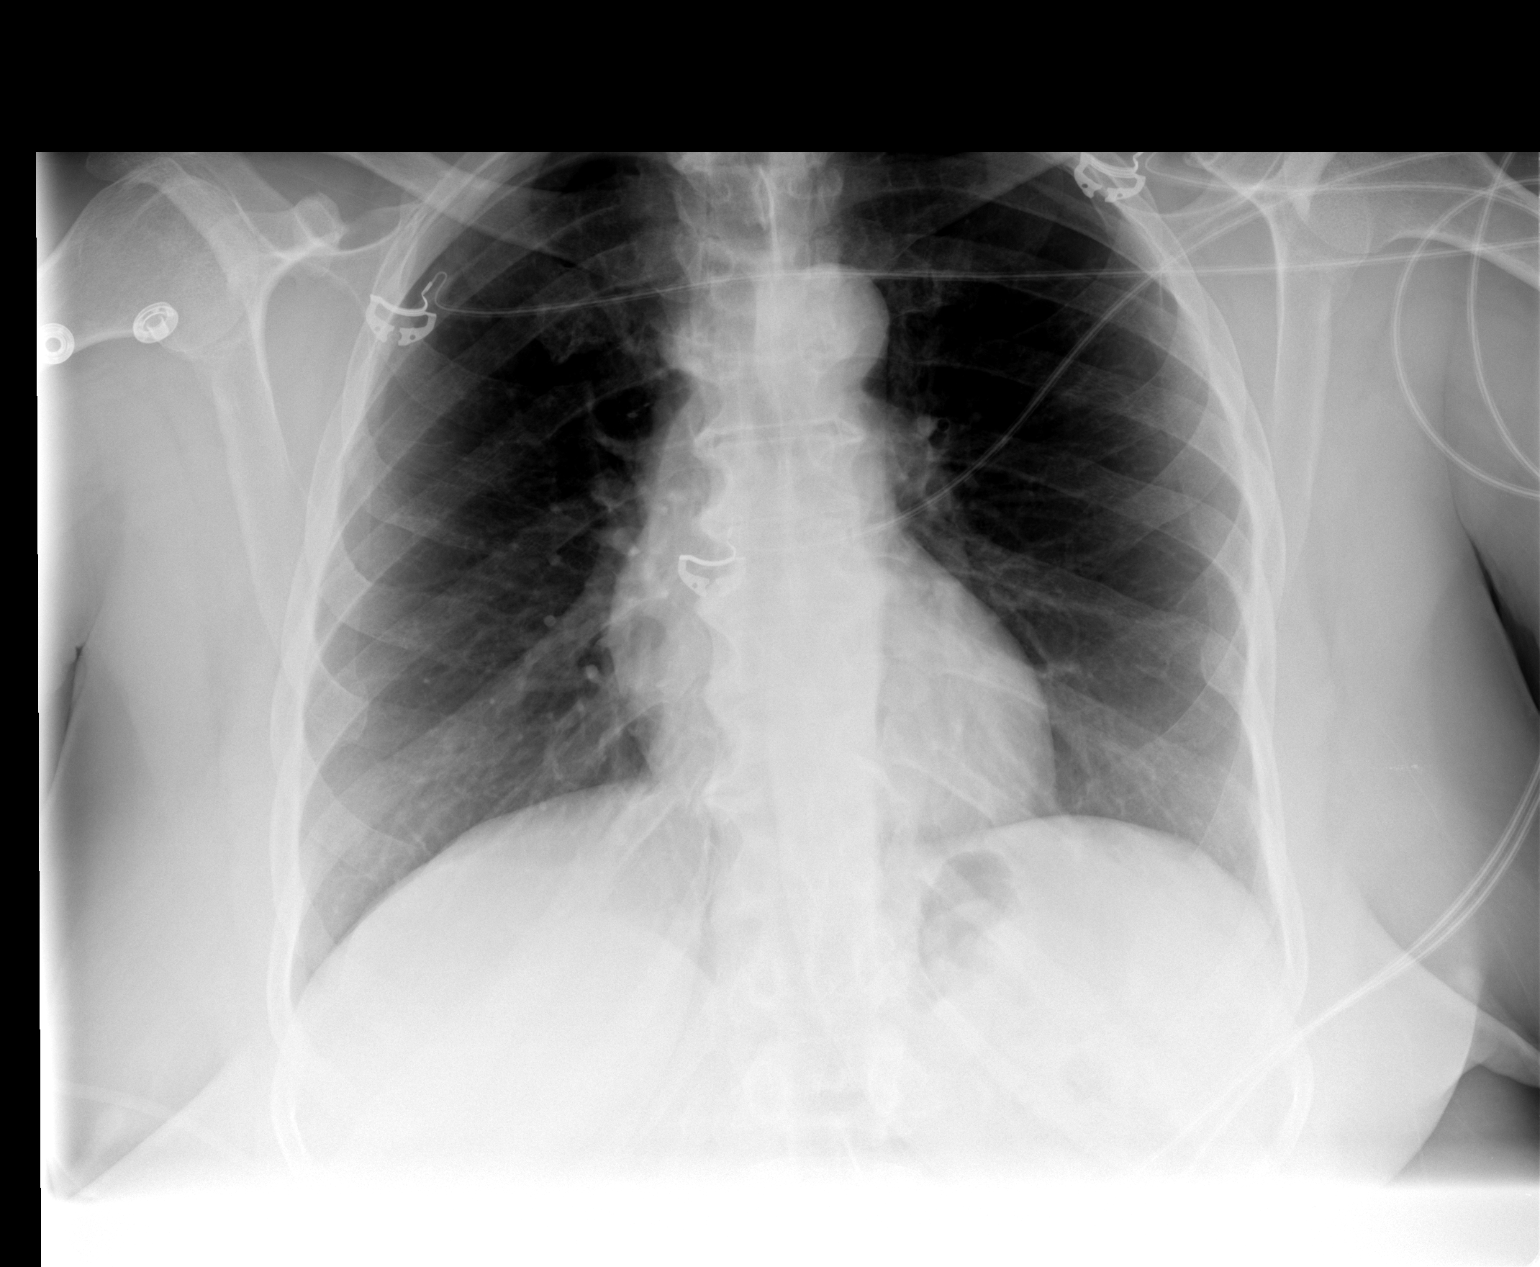

[view not recorded (2 of 2)]
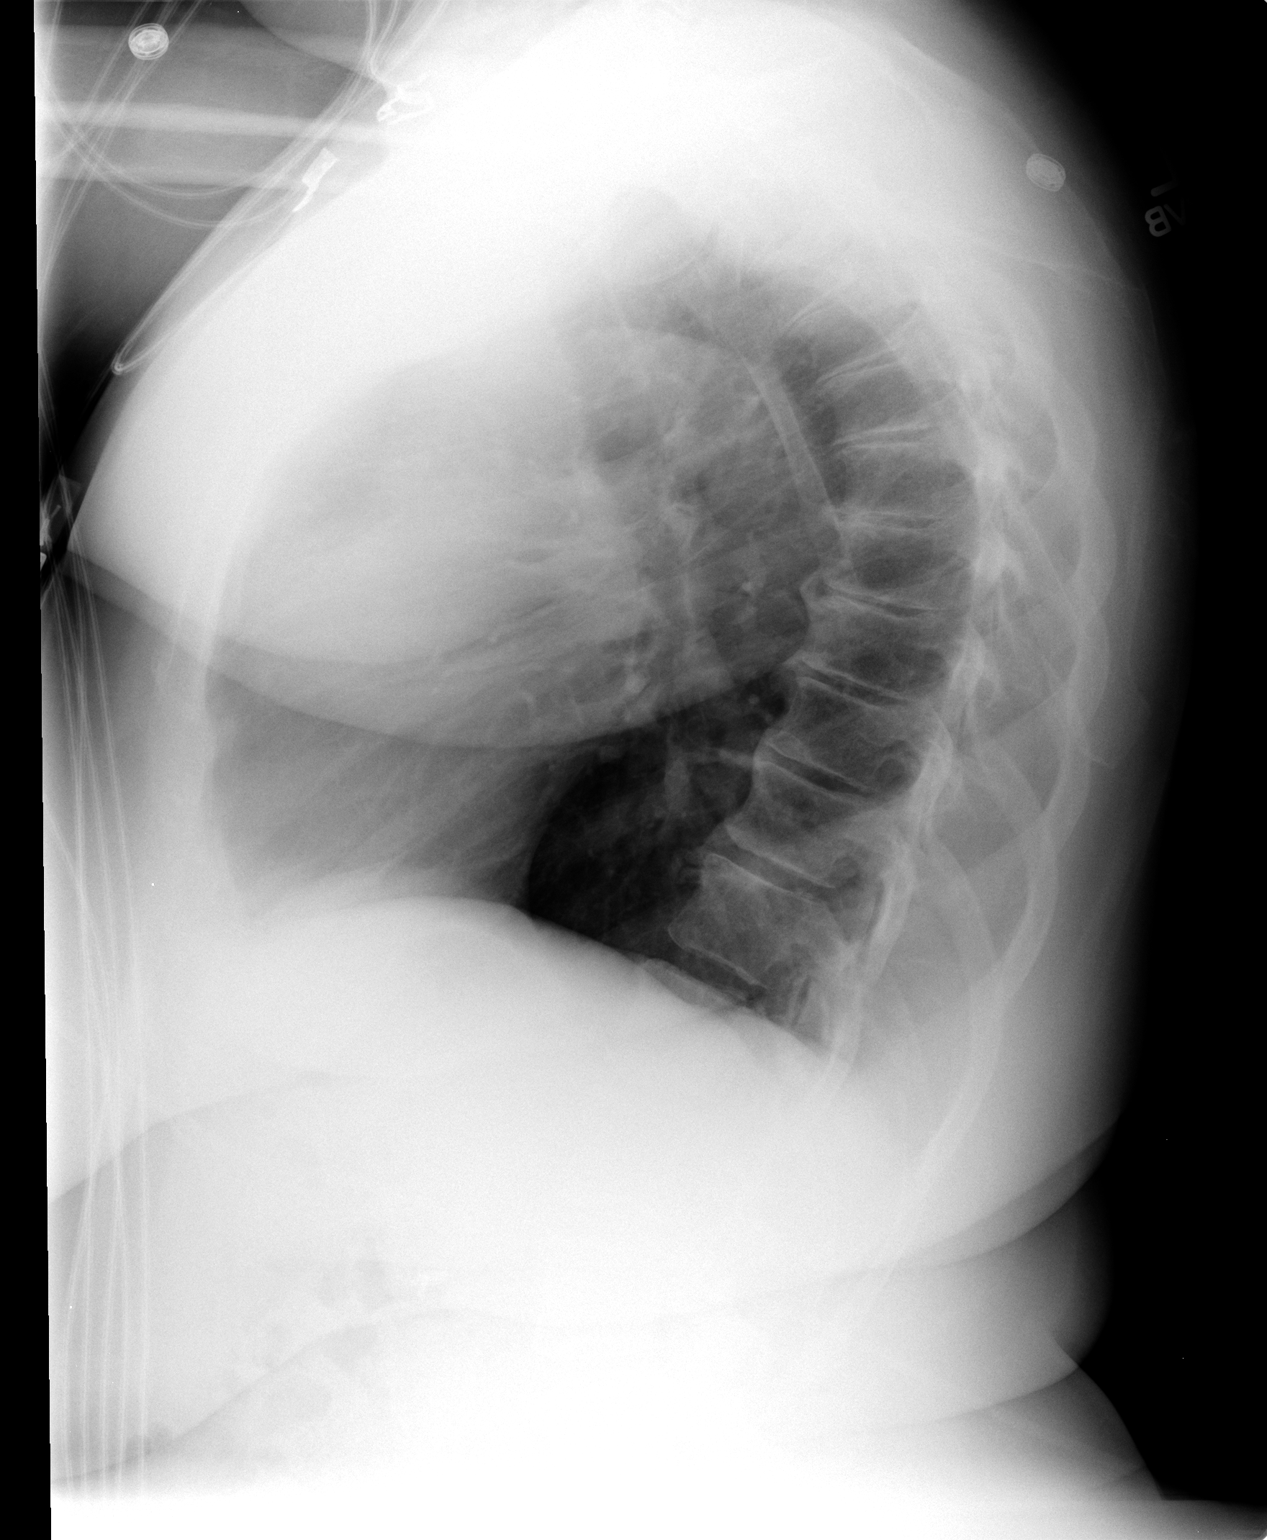

[2 of 2 positions shown; findings below may reference images not displayed]

FINDINGS: No active infiltrate or effusion is seen.  Mediastinal
contours appear stable.  The heart is within normal limits in size.
There are diffuse degenerative changes throughout the thoracic
spine.
IMPRESSION: Stable chest x-ray.  No active lung disease.

## 2014-07-28 ENCOUNTER — Other Ambulatory Visit: Payer: Self-pay | Admitting: Family Medicine

## 2014-07-29 ENCOUNTER — Other Ambulatory Visit: Payer: Self-pay

## 2014-07-29 DIAGNOSIS — I10 Essential (primary) hypertension: Secondary | ICD-10-CM

## 2014-07-29 DIAGNOSIS — K219 Gastro-esophageal reflux disease without esophagitis: Secondary | ICD-10-CM

## 2014-07-29 MED ORDER — LOSARTAN POTASSIUM 50 MG PO TABS: ORAL_TABLET | ORAL | Status: DC

## 2014-07-29 MED ORDER — OMEPRAZOLE 20 MG PO CPDR: 20.0000 mg | DELAYED_RELEASE_CAPSULE | Freq: Every day | ORAL | Status: DC

## 2014-07-29 MED ORDER — ALPRAZOLAM 1 MG PO TABS: ORAL_TABLET | ORAL | Status: DC

## 2014-07-29 MED ORDER — AMLODIPINE BESYLATE 10 MG PO TABS: 10.0000 mg | ORAL_TABLET | ORAL | Status: DC

## 2014-07-29 MED ORDER — ROSUVASTATIN CALCIUM 10 MG PO TABS
10.0000 mg | ORAL_TABLET | Freq: Every day | ORAL | Status: DC
Start: 2014-07-29 — End: 2014-08-24

## 2014-07-29 MED ORDER — POLYETHYLENE GLYCOL 3350 17 GM/SCOOP PO POWD: ORAL | Status: DC

## 2014-07-29 NOTE — Telephone Encounter (Signed)
Medication refilled per protocol. 

## 2014-08-24 ENCOUNTER — Telehealth: Payer: Self-pay | Admitting: Family Medicine

## 2014-08-24 DIAGNOSIS — I10 Essential (primary) hypertension: Secondary | ICD-10-CM

## 2014-08-24 MED ORDER — LOSARTAN POTASSIUM 50 MG PO TABS: ORAL_TABLET | ORAL | Status: DC

## 2014-08-24 MED ORDER — SPIRONOLACTONE 25 MG PO TABS: ORAL_TABLET | ORAL | Status: DC

## 2014-08-24 MED ORDER — POLYETHYLENE GLYCOL 3350 17 GM/SCOOP PO POWD: ORAL | Status: DC

## 2014-08-24 MED ORDER — ROSUVASTATIN CALCIUM 10 MG PO TABS: 10.0000 mg | ORAL_TABLET | Freq: Every day | ORAL | Status: DC

## 2014-08-24 NOTE — Telephone Encounter (Signed)
Prescription sent to pharmacy.

## 2014-08-24 NOTE — Telephone Encounter (Signed)
[place], [facility]   Pt is needing refill on  losartan (COZAAR) 50 MG tablet rosuvastatin (CRESTOR) 10 MG tablet spironolactone (ALDACTONE) 25 MG tablet polyethylene glycol powder (GLYCOLAX/MIRALAX) powder

## 2014-08-25 ENCOUNTER — Other Ambulatory Visit: Payer: Self-pay | Admitting: *Deleted

## 2014-08-25 DIAGNOSIS — I10 Essential (primary) hypertension: Secondary | ICD-10-CM

## 2014-08-25 MED ORDER — ROSUVASTATIN CALCIUM 10 MG PO TABS: 10.0000 mg | ORAL_TABLET | Freq: Every day | ORAL | Status: DC

## 2014-08-25 MED ORDER — POLYETHYLENE GLYCOL 3350 17 GM/SCOOP PO POWD: ORAL | Status: DC

## 2014-08-25 MED ORDER — LOSARTAN POTASSIUM 50 MG PO TABS: ORAL_TABLET | ORAL | Status: DC

## 2014-08-25 NOTE — Telephone Encounter (Signed)
Received prescription refill request on 08/24/2014.  Noted prescriptions for Crestor, Miralax, and Cozaar had not been successfully transmitted.   Prescriptions re-sent to [facility].

## 2014-10-22 ENCOUNTER — Other Ambulatory Visit: Payer: Self-pay | Admitting: Family Medicine

## 2014-10-22 NOTE — Telephone Encounter (Signed)
Refill appropriate and filled per protocol. 

## 2014-10-23 ENCOUNTER — Ambulatory Visit: Payer: Commercial Managed Care - HMO | Admitting: Family Medicine

## 2014-11-04 DIAGNOSIS — C541 Malignant neoplasm of endometrium: Secondary | ICD-10-CM | POA: Diagnosis not present

## 2014-11-10 ENCOUNTER — Ambulatory Visit: Payer: Commercial Managed Care - HMO | Admitting: Family Medicine

## 2014-11-17 ENCOUNTER — Ambulatory Visit (INDEPENDENT_AMBULATORY_CARE_PROVIDER_SITE_OTHER): Payer: Commercial Managed Care - HMO | Admitting: Family Medicine

## 2014-11-17 ENCOUNTER — Encounter: Payer: Self-pay | Admitting: Family Medicine

## 2014-11-17 VITALS — BP 130/70 | HR 72 | Temp 97.7°F | Resp 18 | Ht 62.0 in | Wt 238.0 lb

## 2014-11-17 DIAGNOSIS — R7309 Other abnormal glucose: Secondary | ICD-10-CM | POA: Diagnosis not present

## 2014-11-17 DIAGNOSIS — R0989 Other specified symptoms and signs involving the circulatory and respiratory systems: Secondary | ICD-10-CM | POA: Insufficient documentation

## 2014-11-17 DIAGNOSIS — E669 Obesity, unspecified: Secondary | ICD-10-CM | POA: Diagnosis not present

## 2014-11-17 DIAGNOSIS — F411 Generalized anxiety disorder: Secondary | ICD-10-CM | POA: Diagnosis not present

## 2014-11-17 DIAGNOSIS — E785 Hyperlipidemia, unspecified: Secondary | ICD-10-CM | POA: Diagnosis not present

## 2014-11-17 DIAGNOSIS — K219 Gastro-esophageal reflux disease without esophagitis: Secondary | ICD-10-CM | POA: Diagnosis not present

## 2014-11-17 DIAGNOSIS — I10 Essential (primary) hypertension: Secondary | ICD-10-CM | POA: Diagnosis not present

## 2014-11-17 DIAGNOSIS — R7303 Prediabetes: Secondary | ICD-10-CM

## 2014-11-17 DIAGNOSIS — N760 Acute vaginitis: Secondary | ICD-10-CM | POA: Diagnosis not present

## 2014-11-17 MED ORDER — ALPRAZOLAM 1 MG PO TABS: ORAL_TABLET | ORAL | Status: DC

## 2014-11-17 MED ORDER — AMLODIPINE BESYLATE 10 MG PO TABS: 10.0000 mg | ORAL_TABLET | ORAL | Status: DC

## 2014-11-17 MED ORDER — TRIAMCINOLONE ACETONIDE 0.1 % EX CREA: 1.0000 "application " | TOPICAL_CREAM | Freq: Two times a day (BID) | CUTANEOUS | Status: DC

## 2014-11-17 MED ORDER — ROSUVASTATIN CALCIUM 10 MG PO TABS: 10.0000 mg | ORAL_TABLET | Freq: Every day | ORAL | Status: DC

## 2014-11-17 MED ORDER — OMEPRAZOLE 20 MG PO CPDR: 20.0000 mg | DELAYED_RELEASE_CAPSULE | Freq: Every day | ORAL | Status: DC

## 2014-11-17 MED ORDER — SPIRONOLACTONE 25 MG PO TABS: ORAL_TABLET | ORAL | Status: DC

## 2014-11-17 MED ORDER — NABUMETONE 750 MG PO TABS: 750.0000 mg | ORAL_TABLET | Freq: Every day | ORAL | Status: DC

## 2014-11-17 MED ORDER — FLUCONAZOLE 150 MG PO TABS: 150.0000 mg | ORAL_TABLET | Freq: Once | ORAL | Status: DC

## 2014-11-17 MED ORDER — LOSARTAN POTASSIUM 50 MG PO TABS: ORAL_TABLET | ORAL | Status: DC

## 2014-11-17 NOTE — Assessment & Plan Note (Signed)
Continue low dose xanax

## 2014-11-17 NOTE — Assessment & Plan Note (Signed)
Will give diflucan, recent GYN exam

## 2014-11-17 NOTE — Assessment & Plan Note (Signed)
BP at goal no change to meds

## 2014-11-17 NOTE — Patient Instructions (Signed)
Take the diflucan as prescribed We will call with lab results COntinue all other medications F/U 3 months

## 2014-11-17 NOTE — Progress Notes (Signed)
Patient ID: [name] [name], female   DOB: 15-Jan-1956, 59 y.o.   MRN: [id number]   Subjective:    Patient ID: [name] [name], female    DOB: July 09, 1956, 59 y.o.   MRN: [id number]  Patient presents for 3 month F/U; Chest Congestion; and Vaginal Irritation  patient here to follow chronic medical problems. She is due for recheck on her A1c isn't was slightly elevated at 6.6 she has glucose intolerance. She and her daughter trying any new diet low fat low carb to help with weight loss and her blood sugars. She continues to get a little bit of chest congestion but she has not had any cough and her voice still goes hoarse on and off but she does not want to see ear nose and throat this time.  She's had some vaginal irritation she was recently seen by her GYN she does not have a lot of vaginal discharge but states it feels like typical yeast infection about to start and she would like Diflucan refilled.    Review Of Systems:  GEN- denies fatigue, fever, weight loss,weakness, recent illness HEENT- denies eye drainage, change in vision, nasal discharge, CVS- denies chest pain, palpitations RESP- denies SOB, cough, wheeze ABD- denies N/V, change in stools, abd pain GU- denies dysuria, hematuria, dribbling, incontinence MSK- denies joint pain, muscle aches, injury Neuro- denies headache, dizziness, syncope, seizure activity       Objective:    BP 130/70 mmHg  Pulse 72  Temp(Src) 97.7 F (36.5 C) (Oral)  Resp 18  Ht 5\' 2"  (1.575 m)  Wt 238 lb (107.956 kg)  BMI 43.52 kg/m2  LMP 06/03/2012 GEN- NAD, alert and oriented x3,obese HEENT- PERRL, EOMI, non injected sclera, pink conjunctiva, MMM, oropharynx clear Neck- Supple, no LAD CVS- RRR, no murmur RESP-CTAB EXT- No edema Pulses- Radial, DP- 2+        Assessment & Plan:      Problem List Items Addressed This Visit      Unprioritized   Prediabetes   Relevant Orders   Hemoglobin I3H   Basic metabolic panel   Hyperlipidemia -  Primary      Note: This dictation was prepared with Dragon dictation along with smaller phrase technology. Any transcriptional errors that result from this process are unintentional.

## 2014-11-17 NOTE — Assessment & Plan Note (Signed)
Discussed importance dietary changes and weight loss

## 2014-11-17 NOTE — Assessment & Plan Note (Signed)
Normal exam, trial of mucinex DM

## 2014-11-17 NOTE — Assessment & Plan Note (Signed)
Recheck A1C, discussed dietary changes

## 2014-11-18 ENCOUNTER — Other Ambulatory Visit: Payer: Self-pay | Admitting: *Deleted

## 2014-11-18 DIAGNOSIS — K219 Gastro-esophageal reflux disease without esophagitis: Secondary | ICD-10-CM

## 2014-11-18 LAB — BASIC METABOLIC PANEL
BUN: 13 mg/dL (ref 6–23)
CHLORIDE: 100 meq/L (ref 96–112)
CO2: 32 mEq/L (ref 19–32)
Calcium: 9.6 mg/dL (ref 8.4–10.5)
Creat: 0.82 mg/dL (ref 0.50–1.10)
Glucose, Bld: 84 mg/dL (ref 70–99)
POTASSIUM: 4.3 meq/L (ref 3.5–5.3)
SODIUM: 136 meq/L (ref 135–145)

## 2014-11-18 LAB — HEMOGLOBIN A1C
Hgb A1c MFr Bld: 6.6 % — ABNORMAL HIGH (ref <5.7)
Mean Plasma Glucose: 143 mg/dL — ABNORMAL HIGH (ref <117)

## 2014-11-18 MED ORDER — OMEPRAZOLE 20 MG PO CPDR: 20.0000 mg | DELAYED_RELEASE_CAPSULE | Freq: Every day | ORAL | Status: DC

## 2014-11-18 NOTE — Telephone Encounter (Signed)
Patient seen in office.   Requested refill on Prilosec.   Prescription sent to pharmacy.

## 2014-11-20 ENCOUNTER — Other Ambulatory Visit: Payer: Self-pay | Admitting: *Deleted

## 2014-11-20 MED ORDER — GLIPIZIDE 5 MG PO TABS: 5.0000 mg | ORAL_TABLET | Freq: Every day | ORAL | Status: DC

## 2014-11-24 ENCOUNTER — Telehealth: Payer: Self-pay | Admitting: Family Medicine

## 2014-11-24 DIAGNOSIS — K219 Gastro-esophageal reflux disease without esophagitis: Secondary | ICD-10-CM

## 2014-11-24 MED ORDER — LANCETS MISC: Status: DC

## 2014-11-24 MED ORDER — BLOOD GLUCOSE MONITORING SUPPL W/DEVICE KIT: PACK | Status: DC

## 2014-11-24 MED ORDER — BLOOD GLUCOSE TEST VI STRP: ORAL_STRIP | Status: DC

## 2014-11-24 MED ORDER — OMEPRAZOLE 20 MG PO CPDR: 20.0000 mg | DELAYED_RELEASE_CAPSULE | Freq: Every day | ORAL | Status: DC

## 2014-11-24 MED ORDER — ALCOHOL SWABS PADS: MEDICATED_PAD | Status: DC

## 2014-11-24 NOTE — Telephone Encounter (Signed)
[phone]  PT is needing to speak to you about her diabetic medication

## 2014-11-24 NOTE — Telephone Encounter (Signed)
Call returned to patient.   States that she received refill on Omeprazole from [facility]. Requested to have refill sent to [facility].   Also requested glucometer to monitor FSBS 1x daily.   Prescription sent to pharmacy.

## 2014-11-24 NOTE — Telephone Encounter (Signed)
Received call from patient.   Reports that she began Glipizide 5mg  today. States that she took medication at 1pm. Reports that she began to feel dizzy and lightheaded, so she borrowed her daughter's glucometer. States that her FSBS noted at 26.  Advised to eat snack at this time and to drink either juice or milk.   MD made aware and advised to only take 1/2 tab of Glipizide PO Q AM.  Verbalized understanding.

## 2014-12-04 ENCOUNTER — Telehealth: Payer: Self-pay | Admitting: Family Medicine

## 2014-12-18 ENCOUNTER — Telehealth: Payer: Self-pay | Admitting: Family Medicine

## 2014-12-18 NOTE — Telephone Encounter (Signed)
Call placed to patient.   Reports that FSBS has been running low since she has been taking Glipizide 2.5mg . Reports that her sugars have ranged from 40-50's in the morning. Reports that she feels weak and nauseous, tired and sweaty when her sugars are so low.   Advised to stop medication completely.   MD made aware.

## 2014-12-18 NOTE — Telephone Encounter (Signed)
Patient thinks the glipizide is making her sick, is going to stop taking this until she gets a call back from [place] about what to do  [phone]

## 2014-12-21 NOTE — Telephone Encounter (Signed)
Patient returned call.   States that CBG readings since stopping Glipizide are as follows: 99 128 120  Advised to keep off Glipizide and continue to monitor. Call office if fasting FSBS >140.

## 2015-02-16 ENCOUNTER — Other Ambulatory Visit: Payer: Self-pay | Admitting: Family Medicine

## 2015-02-16 ENCOUNTER — Encounter: Payer: Self-pay | Admitting: Family Medicine

## 2015-02-16 ENCOUNTER — Ambulatory Visit (INDEPENDENT_AMBULATORY_CARE_PROVIDER_SITE_OTHER): Payer: Commercial Managed Care - HMO | Admitting: Family Medicine

## 2015-02-16 VITALS — BP 132/78 | HR 72 | Temp 98.4°F | Resp 16 | Ht 62.0 in | Wt 246.0 lb

## 2015-02-16 DIAGNOSIS — Z79899 Other long term (current) drug therapy: Secondary | ICD-10-CM | POA: Diagnosis not present

## 2015-02-16 DIAGNOSIS — E785 Hyperlipidemia, unspecified: Secondary | ICD-10-CM

## 2015-02-16 DIAGNOSIS — I10 Essential (primary) hypertension: Secondary | ICD-10-CM

## 2015-02-16 DIAGNOSIS — R7309 Other abnormal glucose: Secondary | ICD-10-CM | POA: Diagnosis not present

## 2015-02-16 DIAGNOSIS — R7303 Prediabetes: Secondary | ICD-10-CM

## 2015-02-16 DIAGNOSIS — E669 Obesity, unspecified: Secondary | ICD-10-CM | POA: Diagnosis not present

## 2015-02-16 MED ORDER — AMLODIPINE BESYLATE 10 MG PO TABS: 10.0000 mg | ORAL_TABLET | ORAL | Status: DC

## 2015-02-16 MED ORDER — POLYETHYLENE GLYCOL 3350 17 GM/SCOOP PO POWD: ORAL | Status: DC

## 2015-02-16 NOTE — Addendum Note (Signed)
Addended by: Vic Blackbird F on: 02/16/2015 02:09 PM   Modules accepted: Orders

## 2015-02-16 NOTE — Assessment & Plan Note (Addendum)
Blood pressure is well controlled 

## 2015-02-16 NOTE — Patient Instructions (Signed)
Keep working on diet and weight loss We will call with lab results F/U 3 months

## 2015-02-16 NOTE — Progress Notes (Signed)
Patient ID: [name] [name], female   DOB: 08/22/1956, 59 y.o.   MRN: [id number]   Subjective:    Patient ID: [name] [name], female    DOB: [birth year], 59 y.o.   MRN: [id number]  Patient presents for 3 month F/U  patient follow-up chronic medical problems. She is no particular concerns today. I tried her on so far urea however this caused hypo-glycemia symptoms therefore this was discontinued. She has her meter with her today off of the medication and her blood sugars on average have been 120 or less. She's been trying to watch her diet though she has gained 6 pounds since her last visit.  medications reviewed    Review Of Systems:  GEN- denies fatigue, fever, weight loss,weakness, recent illness HEENT- denies eye drainage, change in vision, nasal discharge, CVS- denies chest pain, palpitations RESP- denies SOB, cough, wheeze ABD- denies N/V, change in stools, abd pain GU- denies dysuria, hematuria, dribbling, incontinence MSK- + joint pain, muscle aches, injury Neuro- denies headache, dizziness, syncope, seizure activity       Objective:    LMP 06/03/2012 GEN- NAD, alert and oriented x3,obese HEENT- PERRL, EOMI, non injected sclera, pink conjunctiva, MMM, oropharynx clear CVS- RRR, no murmur RESP-CTAB EXT- trace ankle edema Pulses- Radial, DP- 2+        Assessment & Plan:      Problem List Items Addressed This Visit    None      Note: This dictation was prepared with Dragon dictation along with smaller phrase technology. Any transcriptional errors that result from this process are unintentional.

## 2015-02-16 NOTE — Assessment & Plan Note (Signed)
Recheck lipid panel and liver function she is currently on Crestor

## 2015-02-16 NOTE — Assessment & Plan Note (Signed)
I'm going to repeat an A1c today. Her last A1c was at 6.6% she's been working on dietary changes though there's been no significant weight loss. Identifies her that if her A1c was greater than 6.5 that she would be classified as diabetic type II

## 2015-02-17 LAB — CBC WITH DIFFERENTIAL/PLATELET
Basophils Absolute: 0.1 10*3/uL (ref 0.0–0.1)
Basophils Relative: 1 % (ref 0–1)
EOS PCT: 3 % (ref 0–5)
Eosinophils Absolute: 0.2 10*3/uL (ref 0.0–0.7)
HEMATOCRIT: 34.8 % — AB (ref 36.0–46.0)
HEMOGLOBIN: 10.9 g/dL — AB (ref 12.0–15.0)
LYMPHS ABS: 1.6 10*3/uL (ref 0.7–4.0)
LYMPHS PCT: 25 % (ref 12–46)
MCH: 26.3 pg (ref 26.0–34.0)
MCHC: 31.3 g/dL (ref 30.0–36.0)
MCV: 83.9 fL (ref 78.0–100.0)
MONO ABS: 0.4 10*3/uL (ref 0.1–1.0)
MPV: 8.8 fL (ref 8.6–12.4)
Monocytes Relative: 6 % (ref 3–12)
NEUTROS ABS: 4.1 10*3/uL (ref 1.7–7.7)
Neutrophils Relative %: 65 % (ref 43–77)
Platelets: 276 10*3/uL (ref 150–400)
RBC: 4.15 MIL/uL (ref 3.87–5.11)
RDW: 15.5 % (ref 11.5–15.5)
WBC: 6.3 10*3/uL (ref 4.0–10.5)

## 2015-02-17 LAB — COMPREHENSIVE METABOLIC PANEL
ALK PHOS: 124 U/L — AB (ref 39–117)
ALT: 10 U/L (ref 0–35)
AST: 17 U/L (ref 0–37)
Albumin: 3.9 g/dL (ref 3.5–5.2)
BUN: 12 mg/dL (ref 6–23)
CO2: 28 meq/L (ref 19–32)
Calcium: 9 mg/dL (ref 8.4–10.5)
Chloride: 101 mEq/L (ref 96–112)
Creat: 0.81 mg/dL (ref 0.50–1.10)
Glucose, Bld: 86 mg/dL (ref 70–99)
Potassium: 4.7 mEq/L (ref 3.5–5.3)
SODIUM: 139 meq/L (ref 135–145)
Total Bilirubin: 0.5 mg/dL (ref 0.2–1.2)
Total Protein: 7.4 g/dL (ref 6.0–8.3)

## 2015-02-17 LAB — HEMOGLOBIN A1C
Hgb A1c MFr Bld: 6.6 % — ABNORMAL HIGH (ref <5.7)
Mean Plasma Glucose: 143 mg/dL — ABNORMAL HIGH (ref <117)

## 2015-02-17 LAB — MICROALBUMIN / CREATININE URINE RATIO
CREATININE, URINE: 190 mg/dL
MICROALB/CREAT RATIO: 2.6 mg/g (ref 0.0–30.0)
Microalb, Ur: 0.5 mg/dL (ref <2.0)

## 2015-02-17 LAB — LIPID PANEL
CHOL/HDL RATIO: 2.5 ratio
Cholesterol: 112 mg/dL (ref 0–200)
HDL: 44 mg/dL — ABNORMAL LOW (ref >=46)
LDL CALC: 53 mg/dL (ref 0–99)
Triglycerides: 73 mg/dL (ref <150)
VLDL: 15 mg/dL (ref 0–40)

## 2015-02-18 LAB — IRON AND TIBC
%SAT: 11 % — ABNORMAL LOW (ref 20–55)
IRON: 34 ug/dL — AB (ref 42–145)
TIBC: 311 ug/dL (ref 250–470)
UIBC: 277 ug/dL (ref 125–400)

## 2015-02-18 LAB — VITAMIN B12: Vitamin B-12: 443 pg/mL (ref 211–911)

## 2015-03-10 ENCOUNTER — Other Ambulatory Visit: Payer: Self-pay | Admitting: *Deleted

## 2015-03-10 MED ORDER — BLOOD GLUCOSE TEST VI STRP: ORAL_STRIP | Status: DC

## 2015-03-10 MED ORDER — ADJUSTABLE LANCING DEVICE MISC: Status: DC

## 2015-03-10 MED ORDER — LANCETS MISC: Status: DC

## 2015-03-10 MED ORDER — BLOOD GLUCOSE MONITORING SUPPL W/DEVICE KIT: PACK | Status: DC

## 2015-04-01 ENCOUNTER — Other Ambulatory Visit: Payer: Self-pay | Admitting: Family Medicine

## 2015-05-05 ENCOUNTER — Telehealth: Payer: Self-pay | Admitting: Family Medicine

## 2015-05-05 MED ORDER — FLUCONAZOLE 150 MG PO TABS: 150.0000 mg | ORAL_TABLET | Freq: Once | ORAL | Status: DC

## 2015-05-05 NOTE — Telephone Encounter (Signed)
Returned call to patient.   Reports that she has increased vaginal itching. Denies discharge at this time. States that she has frequent yeast infections.   Requested refill on Diflucan.   Prescription sent to pharmacy. .   Advised that if S/Sx persist or worsen by Thursday to contact office for OV.

## 2015-05-05 NOTE — Telephone Encounter (Signed)
[phone]  PT is needing a prescription for yeast infection [facility]

## 2015-05-11 ENCOUNTER — Other Ambulatory Visit: Payer: Self-pay | Admitting: Family Medicine

## 2015-05-11 DIAGNOSIS — Z1231 Encounter for screening mammogram for malignant neoplasm of breast: Secondary | ICD-10-CM

## 2015-05-25 ENCOUNTER — Encounter: Payer: Self-pay | Admitting: Family Medicine

## 2015-05-25 ENCOUNTER — Ambulatory Visit (INDEPENDENT_AMBULATORY_CARE_PROVIDER_SITE_OTHER): Payer: Commercial Managed Care - HMO | Admitting: Family Medicine

## 2015-05-25 VITALS — BP 134/76 | HR 78 | Temp 97.8°F | Resp 16 | Ht 62.0 in | Wt 244.0 lb

## 2015-05-25 DIAGNOSIS — Z0000 Encounter for general adult medical examination without abnormal findings: Secondary | ICD-10-CM | POA: Diagnosis not present

## 2015-05-25 DIAGNOSIS — R7309 Other abnormal glucose: Secondary | ICD-10-CM | POA: Diagnosis not present

## 2015-05-25 DIAGNOSIS — R7303 Prediabetes: Secondary | ICD-10-CM

## 2015-05-25 DIAGNOSIS — I868 Varicose veins of other specified sites: Secondary | ICD-10-CM | POA: Diagnosis not present

## 2015-05-25 DIAGNOSIS — E669 Obesity, unspecified: Secondary | ICD-10-CM

## 2015-05-25 DIAGNOSIS — E785 Hyperlipidemia, unspecified: Secondary | ICD-10-CM | POA: Diagnosis not present

## 2015-05-25 DIAGNOSIS — M7989 Other specified soft tissue disorders: Secondary | ICD-10-CM | POA: Diagnosis not present

## 2015-05-25 DIAGNOSIS — I8390 Asymptomatic varicose veins of unspecified lower extremity: Secondary | ICD-10-CM

## 2015-05-25 DIAGNOSIS — I10 Essential (primary) hypertension: Secondary | ICD-10-CM

## 2015-05-25 LAB — CBC WITH DIFFERENTIAL/PLATELET
BASOS ABS: 0.1 10*3/uL (ref 0.0–0.1)
BASOS PCT: 1 % (ref 0–1)
EOS ABS: 0.4 10*3/uL (ref 0.0–0.7)
EOS PCT: 6 % — AB (ref 0–5)
HEMATOCRIT: 35.8 % — AB (ref 36.0–46.0)
Hemoglobin: 11.5 g/dL — ABNORMAL LOW (ref 12.0–15.0)
LYMPHS ABS: 1.5 10*3/uL (ref 0.7–4.0)
LYMPHS PCT: 25 % (ref 12–46)
MCH: 26.8 pg (ref 26.0–34.0)
MCHC: 32.1 g/dL (ref 30.0–36.0)
MCV: 83.4 fL (ref 78.0–100.0)
MPV: 8.7 fL (ref 8.6–12.4)
Monocytes Absolute: 0.4 10*3/uL (ref 0.1–1.0)
Monocytes Relative: 6 % (ref 3–12)
NEUTROS ABS: 3.7 10*3/uL (ref 1.7–7.7)
NEUTROS PCT: 62 % (ref 43–77)
PLATELETS: 260 10*3/uL (ref 150–400)
RBC: 4.29 MIL/uL (ref 3.87–5.11)
RDW: 15.3 % (ref 11.5–15.5)
WBC: 5.9 10*3/uL (ref 4.0–10.5)

## 2015-05-25 LAB — COMPREHENSIVE METABOLIC PANEL
ALT: 10 U/L (ref 6–29)
AST: 19 U/L (ref 10–35)
Albumin: 3.8 g/dL (ref 3.6–5.1)
Alkaline Phosphatase: 129 U/L (ref 33–130)
BUN: 14 mg/dL (ref 7–25)
CO2: 30 mmol/L (ref 20–31)
Calcium: 9.5 mg/dL (ref 8.6–10.4)
Chloride: 100 mmol/L (ref 98–110)
Creat: 0.85 mg/dL (ref 0.50–0.99)
Glucose, Bld: 110 mg/dL — ABNORMAL HIGH (ref 70–99)
POTASSIUM: 4.2 mmol/L (ref 3.5–5.3)
Sodium: 142 mmol/L (ref 135–146)
Total Bilirubin: 0.5 mg/dL (ref 0.2–1.2)
Total Protein: 7.4 g/dL (ref 6.1–8.1)

## 2015-05-25 LAB — HEMOGLOBIN A1C
Hgb A1c MFr Bld: 6.8 % — ABNORMAL HIGH (ref <5.7)
MEAN PLASMA GLUCOSE: 148 mg/dL — AB (ref <117)

## 2015-05-25 MED ORDER — ZOSTER VACCINE LIVE 19400 UNT/0.65ML ~~LOC~~ SOLR: 0.6500 mL | Freq: Once | SUBCUTANEOUS | Status: DC

## 2015-05-25 MED ORDER — FUROSEMIDE 20 MG PO TABS: 20.0000 mg | ORAL_TABLET | Freq: Every day | ORAL | Status: DC | PRN

## 2015-05-25 MED ORDER — ALPRAZOLAM 1 MG PO TABS: ORAL_TABLET | ORAL | Status: DC

## 2015-05-25 NOTE — Assessment & Plan Note (Signed)
Recheck A1c discussed the importance of dietary adherence and weight loss.

## 2015-05-25 NOTE — Patient Instructions (Addendum)
Continue current medications Follow-up with Mammogram Medications refilled We will call with lab results Shingles Vaccine  Wear the compression hose Use lasix as needed F/U 4 months

## 2015-05-25 NOTE — Assessment & Plan Note (Signed)
Blood pressure well controlled no change in medication 

## 2015-05-25 NOTE — Assessment & Plan Note (Signed)
She has varicose veins as well. I'll have her use the compression hose will also give her Lasix 20 mg as needed

## 2015-05-25 NOTE — Progress Notes (Signed)
Patient ID: [name] [name], female   DOB: 1955/11/30, 59 y.o.   MRN: [id number] Subjective:   Patient presents for Medicare Annual/Subsequent preventive examination.   patient here for a low wellness exam. Her only concern is that she has been getting some foot swelling on and off which she has had in the past. She does have compression hose but has been very hot she has not been wearing them. She tried it diaphoretic that she had at home and did well with this. She denies any shortness of breath no chest pain.    Borderline diabetes last A1c was 6.6% medication was discontinued because she had hypoglycemia symptoms. She states she has been working on her diet in her weight her weight is down 2 pounds. Her seven-day average is 122 her 30 day average 121 fasting Review Past Medical/Family/Social: Per EMR   Risk Factors  Current exercise habits: walks Dietary issues discussed: Yes  Cardiac risk factors: Obesity (BMI >= 30 kg/m2). TIA  Depression Screen  (Note: if answer to either of the following is "Yes", a more complete depression screening is indicated)  Over the past two weeks, have you felt down, depressed or hopeless? No Over the past two weeks, have you felt little interest or pleasure in doing things? No Have you lost interest or pleasure in daily life? No Do you often feel hopeless? No Do you cry easily over simple problems? No   Activities of Daily Living  In your present state of health, do you have any difficulty performing the following activities?:  Driving? No  Managing money? No  Feeding yourself? No  Getting from bed to chair? No  Climbing a flight of stairs? No  Preparing food and eating?: No  Bathing or showering? No  Getting dressed: No  Getting to the toilet? No  Using the toilet:No  Moving around from place to place: No  In the past year have you fallen or had a near fall?:No  Are you sexually active? No  Do you have more than one partner? No   Hearing  Difficulties: No  Do you often ask people to speak up or repeat themselves? No  Do you experience ringing or noises in your ears? No Do you have difficulty understanding soft or whispered voices? No  Do you feel that you have a problem with memory? No Do you often misplace items? No  Do you feel safe at home? Yes  Cognitive Testing  Alert? Yes Normal Appearance?Yes  Oriented to person? Yes Place? Yes  Time? Yes  Recall of three objects? Yes  Can perform simple calculations? Yes  Displays appropriate judgment?Yes  Can read the correct time from a watch face?Yes   List the Names of Other Physician/Practitioners you currently use: Dr. Garwin Brothers- OB/GYN   Screening Tests / Date Colonoscopy   -UTD                  Zostavax - Due Mammogram - UTD Influenza Vaccine  Tetanus/tdapUTD  ROS: GEN- denies fatigue, fever, weight loss,weakness, recent illness HEENT- denies eye drainage, change in vision, nasal discharge, CVS- denies chest pain, palpitations RESP- denies SOB, cough, wheeze ABD- denies N/V, change in stools, abd pain GU- denies dysuria, hematuria, dribbling, incontinence MSK- denies joint pain, muscle aches, injury Neuro- denies headache, dizziness, syncope, seizure activity  Physical:  GEN- NAD, alert and oriented x3 HEENT- PERRL, EOMI, non injected sclera, pink conjunctiva, MMM, oropharynx clear Neck- Supple, no thryomegaly, no JVD, no bruit CVS-  RRR, no murmur RESP-CTAB ABD-NABS,soft,NT,ND EXT- pedal edema Pulses- Radial, DP- 2+    Assessment:    Annual wellness medicare exam   Plan:    During the course of the visit the patient was educated and counseled about appropriate screening and preventive services including:  Screening mammography  Shingles vaccine. Prescription given to that she can get the vaccine at the pharmacy or Medicare part D.  Screen NEG for depression.   Diet review for nutrition referral? Yes ____ Not Indicated __x__  Patient  Instructions (the written plan) was given to the patient.  Medicare Attestation  I have personally reviewed:  The patient's medical and social history  Their use of alcohol, tobacco or illicit drugs  Their current medications and supplements  The patient's functional ability including ADLs,fall risks, home safety risks, cognitive, and hearing and visual impairment  Diet and physical activities  Evidence for depression or mood disorders  The patient's weight, height, BMI, and visual acuity have been recorded in the chart. I have made referrals, counseling, and provided education to the patient based on review of the above and I have provided the patient with a written personalized care plan for preventive services.

## 2015-06-23 ENCOUNTER — Ambulatory Visit (HOSPITAL_COMMUNITY)
Admission: RE | Admit: 2015-06-23 | Discharge: 2015-06-23 | Disposition: A | Discharge status: Home / Self Care | Payer: Commercial Managed Care - HMO | Source: Ambulatory Visit | Attending: Family Medicine | Admitting: Family Medicine

## 2015-06-23 DIAGNOSIS — Z1231 Encounter for screening mammogram for malignant neoplasm of breast: Secondary | ICD-10-CM | POA: Insufficient documentation

## 2015-06-30 ENCOUNTER — Telehealth: Payer: Self-pay | Admitting: Family Medicine

## 2015-06-30 ENCOUNTER — Other Ambulatory Visit: Payer: Self-pay | Admitting: Family Medicine

## 2015-06-30 NOTE — Telephone Encounter (Signed)
Refill appropriate and filled per protocol. 

## 2015-06-30 NOTE — Telephone Encounter (Addendum)
Pt is calling to request a 90 day rx of Alazophram 1 mg and faxed to [facility]. Pt's number is [phone]

## 2015-07-01 NOTE — Telephone Encounter (Signed)
Per chart review, noted prescription faxed on 05/25/2015 with #2 refills.   Call placed to patient. States that she is requesting 90 day supply.   MD please advise.

## 2015-07-02 MED ORDER — ALPRAZOLAM 1 MG PO TABS: ORAL_TABLET | ORAL | Status: DC

## 2015-07-02 NOTE — Telephone Encounter (Signed)
[facility]

## 2015-07-02 NOTE — Telephone Encounter (Signed)
Call placed to patient to make aware.   States that she is having difficulty getting (1) month prescription from [employer] [facility].   Requested for Xanax prescription to be sent to local [facility].   MD please advise.

## 2015-07-02 NOTE — Telephone Encounter (Signed)
Medication called to pharmacy. 

## 2015-07-02 NOTE — Telephone Encounter (Signed)
No 90 day on controlled

## 2015-08-11 ENCOUNTER — Other Ambulatory Visit: Payer: Self-pay | Admitting: Family Medicine

## 2015-08-11 NOTE — Telephone Encounter (Signed)
Refill appropriate and filled per protocol. 

## 2015-08-25 ENCOUNTER — Ambulatory Visit: Payer: Commercial Managed Care - HMO | Admitting: Family Medicine

## 2015-08-27 ENCOUNTER — Encounter: Payer: Self-pay | Admitting: Family Medicine

## 2015-08-27 ENCOUNTER — Ambulatory Visit (INDEPENDENT_AMBULATORY_CARE_PROVIDER_SITE_OTHER): Payer: Commercial Managed Care - HMO | Admitting: Family Medicine

## 2015-08-27 VITALS — BP 136/80 | HR 82 | Temp 97.6°F | Resp 14 | Ht 62.0 in | Wt 246.0 lb

## 2015-08-27 DIAGNOSIS — I10 Essential (primary) hypertension: Secondary | ICD-10-CM | POA: Diagnosis not present

## 2015-08-27 DIAGNOSIS — F411 Generalized anxiety disorder: Secondary | ICD-10-CM | POA: Diagnosis not present

## 2015-08-27 DIAGNOSIS — R490 Dysphonia: Secondary | ICD-10-CM

## 2015-08-27 DIAGNOSIS — Z299 Encounter for prophylactic measures, unspecified: Secondary | ICD-10-CM

## 2015-08-27 DIAGNOSIS — E669 Obesity, unspecified: Secondary | ICD-10-CM | POA: Diagnosis not present

## 2015-08-27 DIAGNOSIS — R7303 Prediabetes: Secondary | ICD-10-CM | POA: Diagnosis not present

## 2015-08-27 DIAGNOSIS — E785 Hyperlipidemia, unspecified: Secondary | ICD-10-CM

## 2015-08-27 DIAGNOSIS — Z418 Encounter for other procedures for purposes other than remedying health state: Secondary | ICD-10-CM

## 2015-08-27 DIAGNOSIS — R7309 Other abnormal glucose: Secondary | ICD-10-CM | POA: Diagnosis not present

## 2015-08-27 LAB — COMPREHENSIVE METABOLIC PANEL
ALBUMIN: 4.1 g/dL (ref 3.6–5.1)
ALT: 13 U/L (ref 6–29)
AST: 20 U/L (ref 10–35)
Alkaline Phosphatase: 145 U/L — ABNORMAL HIGH (ref 33–130)
BILIRUBIN TOTAL: 0.6 mg/dL (ref 0.2–1.2)
BUN: 18 mg/dL (ref 7–25)
CALCIUM: 9.6 mg/dL (ref 8.6–10.4)
CHLORIDE: 106 mmol/L (ref 98–110)
CO2: 30 mmol/L (ref 20–31)
Creat: 0.95 mg/dL (ref 0.50–0.99)
Glucose, Bld: 95 mg/dL (ref 70–99)
POTASSIUM: 5.1 mmol/L (ref 3.5–5.3)
SODIUM: 146 mmol/L (ref 135–146)
Total Protein: 8.1 g/dL (ref 6.1–8.1)

## 2015-08-27 LAB — CBC WITH DIFFERENTIAL/PLATELET
BASOS ABS: 0.1 10*3/uL (ref 0.0–0.1)
BASOS PCT: 1 % (ref 0–1)
Eosinophils Absolute: 0.3 10*3/uL (ref 0.0–0.7)
Eosinophils Relative: 4 % (ref 0–5)
HEMATOCRIT: 36.7 % (ref 36.0–46.0)
HEMOGLOBIN: 11.9 g/dL — AB (ref 12.0–15.0)
LYMPHS PCT: 29 % (ref 12–46)
Lymphs Abs: 1.9 10*3/uL (ref 0.7–4.0)
MCH: 27.2 pg (ref 26.0–34.0)
MCHC: 32.4 g/dL (ref 30.0–36.0)
MCV: 84 fL (ref 78.0–100.0)
MONO ABS: 0.5 10*3/uL (ref 0.1–1.0)
MPV: 8.8 fL (ref 8.6–12.4)
Monocytes Relative: 7 % (ref 3–12)
NEUTROS ABS: 3.8 10*3/uL (ref 1.7–7.7)
NEUTROS PCT: 59 % (ref 43–77)
Platelets: 292 10*3/uL (ref 150–400)
RBC: 4.37 MIL/uL (ref 3.87–5.11)
RDW: 14.8 % (ref 11.5–15.5)
WBC: 6.5 10*3/uL (ref 4.0–10.5)

## 2015-08-27 LAB — LIPID PANEL
CHOL/HDL RATIO: 2.6 ratio (ref <=5.0)
CHOLESTEROL: 126 mg/dL (ref 125–200)
HDL: 48 mg/dL (ref >=46)
LDL Cholesterol: 61 mg/dL (ref <130)
TRIGLYCERIDES: 84 mg/dL (ref <150)
VLDL: 17 mg/dL (ref <30)

## 2015-08-27 LAB — HEMOGLOBIN A1C
Hgb A1c MFr Bld: 6.8 % — ABNORMAL HIGH (ref <5.7)
MEAN PLASMA GLUCOSE: 148 mg/dL — AB (ref <117)

## 2015-08-27 NOTE — Patient Instructions (Addendum)
Referral to ENT  We will call with lab results Flu shot given  F/U 4 months

## 2015-08-27 NOTE — Progress Notes (Signed)
Addendum patient has also had chronic hoarse voice on and off for   A year now. I tried prednisone she was treated for infection she was also given proton pump inhibitor none have helpedShe would now like to proceed with  ENT referral

## 2015-08-27 NOTE — Addendum Note (Signed)
Addended by: Vic Blackbird F on: 08/27/2015 01:17 PM   Modules accepted: Orders

## 2015-08-27 NOTE — Assessment & Plan Note (Signed)
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

## 2015-08-27 NOTE — Assessment & Plan Note (Signed)
Well controlled, no change to meds, check renal function Flu shot given

## 2015-08-27 NOTE — Assessment & Plan Note (Addendum)
Diet controlled recheck A1C, she did not do well with lower doses of anti-glycemic meds, so will treat if  A1C are > 7 %

## 2015-08-27 NOTE — Progress Notes (Signed)
Patient ID: KADIN SABEY, female   DOB: 01-15-1956, 59 y.o.   MRN: [id number]   Subjective:    Patient ID: ALEIGHNA TELLES, female    DOB: [birth year], 59 y.o.   MRN: [id number]  Patient presents for 3 month F/U  patient here to follow-up chronic medical problems. She has hyperlipidemia hypertension and prediabetes. History no significant change in her weight. She still has difficulty with her meal planning. She has been checking her blood sugars they have been 129 based on her 30 day average. This morning her blood sugars 154. She has a lot of stress at home she is caring for her mother since her father passed away. Her mother has some dementia and she is not getting any help from her other siblings. She does take her Xanax every night to help her sleep.   she still gets some vaginal irritation on the outside after she wipes at times. When she uses nystatin cream this helps.  Review Of Systems:  GEN- denies fatigue, fever, weight loss,weakness, recent illness HEENT- denies eye drainage, change in vision, nasal discharge, CVS- denies chest pain, palpitations RESP- denies SOB, cough, wheeze ABD- denies N/V, change in stools, abd pain GU- denies dysuria, hematuria, dribbling, incontinence MSK- denies joint pain, muscle aches, injury Neuro- denies headache, dizziness, syncope, seizure activity       Objective:    BP 136/80 mmHg  Pulse 82  Temp(Src) 97.6 F (36.4 C) (Oral)  Resp 14  Ht 5\' 2"  (1.575 m)  Wt 246 lb (111.585 kg)  BMI 44.98 kg/m2  LMP 06/03/2012 GEN- NAD, alert and oriented x3 HEENT- PERRL, EOMI, non injected sclera, pink conjunctiva, MMM, oropharynx clear Neck- Supple, no thyromegaly CVS- RRR, no murmur RESP-CTAB EXT- No edema Pysch- very animated discussing mother, not depressed or anxious appearing, no SI Pulses- Radial, DP- 2+        Assessment & Plan:      Problem List Items Addressed This Visit    Prediabetes    Diet controlled recheck A1C, she did not  do well with lower doses of anti-glycemic meds, so will treat if  A1C are > 7 %      Relevant Orders   Hemoglobin A1c   Obesity (BMI 30-39.9)   Hyperlipidemia    On crestor      Relevant Orders   Lipid panel   HTN, goal below 130/80    Well controlled, no change to meds, check renal function Flu shot given      Relevant Orders   CBC with Differential/Platelet   Comprehensive metabolic panel   Anxiety state    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 Any transcriptional errors that result from this process are unintentional.

## 2015-08-27 NOTE — Assessment & Plan Note (Signed)
On crestor.   

## 2015-09-14 ENCOUNTER — Other Ambulatory Visit: Payer: Self-pay | Admitting: Family Medicine

## 2015-09-14 NOTE — Telephone Encounter (Signed)
Refill appropriate and filled per protocol. 

## 2015-10-04 DIAGNOSIS — H5210 Myopia, unspecified eye: Secondary | ICD-10-CM | POA: Diagnosis not present

## 2015-10-04 DIAGNOSIS — H5213 Myopia, bilateral: Secondary | ICD-10-CM | POA: Diagnosis not present

## 2015-10-04 DIAGNOSIS — Z0100 Encounter for examination of eyes and vision without abnormal findings: Secondary | ICD-10-CM | POA: Diagnosis not present

## 2015-11-30 DIAGNOSIS — Z01419 Encounter for gynecological examination (general) (routine) without abnormal findings: Secondary | ICD-10-CM | POA: Diagnosis not present

## 2015-12-10 ENCOUNTER — Other Ambulatory Visit: Payer: Self-pay | Admitting: Family Medicine

## 2015-12-10 MED ORDER — ALPRAZOLAM 1 MG PO TABS: ORAL_TABLET | ORAL | Status: DC

## 2015-12-10 NOTE — Telephone Encounter (Signed)
Ok to refill??  Last office visit 08/27/2015.  Last refill 07/02/2015, #2 refills.

## 2015-12-10 NOTE — Telephone Encounter (Signed)
Routine medications sent to [facility].   Difulcan refused.   Xanax called to [facility].

## 2015-12-10 NOTE — Telephone Encounter (Signed)
Okay to refill? 

## 2015-12-27 ENCOUNTER — Ambulatory Visit: Payer: Commercial Managed Care - HMO | Admitting: Family Medicine

## 2015-12-31 ENCOUNTER — Ambulatory Visit: Payer: Commercial Managed Care - HMO | Admitting: Family Medicine

## 2016-01-04 ENCOUNTER — Ambulatory Visit (INDEPENDENT_AMBULATORY_CARE_PROVIDER_SITE_OTHER): Payer: Commercial Managed Care - HMO | Admitting: Family Medicine

## 2016-01-04 VITALS — BP 134/78 | HR 72 | Temp 98.8°F | Resp 14 | Ht 62.0 in | Wt 231.0 lb

## 2016-01-04 DIAGNOSIS — J309 Allergic rhinitis, unspecified: Secondary | ICD-10-CM | POA: Insufficient documentation

## 2016-01-04 DIAGNOSIS — I10 Essential (primary) hypertension: Secondary | ICD-10-CM

## 2016-01-04 DIAGNOSIS — R7303 Prediabetes: Secondary | ICD-10-CM

## 2016-01-04 DIAGNOSIS — J301 Allergic rhinitis due to pollen: Secondary | ICD-10-CM

## 2016-01-04 DIAGNOSIS — E669 Obesity, unspecified: Secondary | ICD-10-CM

## 2016-01-04 DIAGNOSIS — R490 Dysphonia: Secondary | ICD-10-CM

## 2016-01-04 DIAGNOSIS — R7309 Other abnormal glucose: Secondary | ICD-10-CM | POA: Diagnosis not present

## 2016-01-04 LAB — CBC WITH DIFFERENTIAL/PLATELET
Basophils Absolute: 0.1 10*3/uL (ref 0.0–0.1)
Basophils Relative: 1 % (ref 0–1)
EOS PCT: 4 % (ref 0–5)
Eosinophils Absolute: 0.2 10*3/uL (ref 0.0–0.7)
HEMATOCRIT: 36 % (ref 36.0–46.0)
HEMOGLOBIN: 12.1 g/dL (ref 12.0–15.0)
LYMPHS ABS: 1.5 10*3/uL (ref 0.7–4.0)
LYMPHS PCT: 27 % (ref 12–46)
MCH: 28.6 pg (ref 26.0–34.0)
MCHC: 33.6 g/dL (ref 30.0–36.0)
MCV: 85.1 fL (ref 78.0–100.0)
MONO ABS: 0.4 10*3/uL (ref 0.1–1.0)
MONOS PCT: 7 % (ref 3–12)
MPV: 8.7 fL (ref 8.6–12.4)
NEUTROS ABS: 3.3 10*3/uL (ref 1.7–7.7)
Neutrophils Relative %: 61 % (ref 43–77)
Platelets: 265 10*3/uL (ref 150–400)
RBC: 4.23 MIL/uL (ref 3.87–5.11)
RDW: 14.7 % (ref 11.5–15.5)
WBC: 5.4 10*3/uL (ref 4.0–10.5)

## 2016-01-04 LAB — COMPLETE METABOLIC PANEL WITH GFR
ALT: 13 U/L (ref 6–29)
AST: 22 U/L (ref 10–35)
Albumin: 4.1 g/dL (ref 3.6–5.1)
Alkaline Phosphatase: 119 U/L (ref 33–130)
BUN: 17 mg/dL (ref 7–25)
CHLORIDE: 102 mmol/L (ref 98–110)
CO2: 28 mmol/L (ref 20–31)
CREATININE: 0.97 mg/dL (ref 0.50–0.99)
Calcium: 9.7 mg/dL (ref 8.6–10.4)
GFR, Est African American: 73 mL/min (ref >=60)
GFR, Est Non African American: 64 mL/min (ref >=60)
Glucose, Bld: 89 mg/dL (ref 70–99)
Potassium: 4.6 mmol/L (ref 3.5–5.3)
Sodium: 139 mmol/L (ref 135–146)
Total Bilirubin: 0.6 mg/dL (ref 0.2–1.2)
Total Protein: 7.8 g/dL (ref 6.1–8.1)

## 2016-01-04 MED ORDER — ROSUVASTATIN CALCIUM 10 MG PO TABS: 10.0000 mg | ORAL_TABLET | Freq: Every day | ORAL | Status: DC

## 2016-01-04 MED ORDER — AMLODIPINE BESYLATE 10 MG PO TABS: 10.0000 mg | ORAL_TABLET | Freq: Every morning | ORAL | Status: DC

## 2016-01-04 MED ORDER — LOSARTAN POTASSIUM 50 MG PO TABS: ORAL_TABLET | ORAL | Status: DC

## 2016-01-04 MED ORDER — FUROSEMIDE 20 MG PO TABS: 20.0000 mg | ORAL_TABLET | Freq: Every day | ORAL | Status: DC | PRN

## 2016-01-04 MED ORDER — SPIRONOLACTONE 25 MG PO TABS: 25.0000 mg | ORAL_TABLET | Freq: Every morning | ORAL | Status: DC

## 2016-01-04 MED ORDER — POLYETHYLENE GLYCOL 3350 17 GM/SCOOP PO POWD: ORAL | Status: DC

## 2016-01-04 MED ORDER — ALPRAZOLAM 1 MG PO TABS: ORAL_TABLET | ORAL | Status: DC

## 2016-01-04 MED ORDER — NABUMETONE 750 MG PO TABS: 750.0000 mg | ORAL_TABLET | Freq: Every day | ORAL | Status: DC

## 2016-01-04 MED ORDER — OMEPRAZOLE 20 MG PO CPDR: 20.0000 mg | DELAYED_RELEASE_CAPSULE | Freq: Every day | ORAL | Status: DC

## 2016-01-04 MED ORDER — FLUTICASONE PROPIONATE 50 MCG/ACT NA SUSP: 2.0000 | Freq: Every day | NASAL | Status: DC

## 2016-01-04 NOTE — Progress Notes (Signed)
Patient ID: [name] [name], female   DOB: Mar 26, 1956, 60 y.o.   MRN: [id number]     Subjective:    Patient ID: [name] [name], female    DOB: 08-27-1956, 60 y.o.   MRN: [id number]  Patient presents for Follow-up Patient for follow-up on chronic medical problems. She now wants to proceed with scheduling the ENT visit for her recurrent hoarse force. She is now getting nasal congestion and drainage again. She has history of rhinitis. This may also be to drip into her hoarse voice.  Diabetes mellitus borderline last A1c was up to 6.8% we've been working on dietary changes she has not tolerated even low doses of medication to help with her blood glucose and weight. Her 36 day averages 128. Her 90 day average is 123 her fastings are typically less than 120. She's been working diligently on her weight and she is down 15 pounds she's been exercising and doing at low-carb diet with her daughter    Review Of Systems:  GEN- denies fatigue, fever, weight loss,weakness, recent illness HEENT- denies eye drainage, change in vision, nasal discharge, CVS- denies chest pain, palpitations RESP- denies SOB, cough, wheeze ABD- denies N/V, change in stools, abd pain GU- denies dysuria, hematuria, dribbling, incontinence MSK- denies joint pain, muscle aches, injury Neuro- denies headache, dizziness, syncope, seizure activity       Objective:    BP 134/78 mmHg  Pulse 72  Temp(Src) 98.8 F (37.1 C)  Resp 14  Ht 5\' 2"  (1.575 m)  Wt 231 lb (104.781 kg)  BMI 42.24 kg/m2  LMP 06/03/2012 GEN- NAD, alert and oriented x3 HEENT- PERRL, EOMI, non injected sclera, pink conjunctiva, MMM, oropharynx clear Neck- Supple, no thyromegaly CVS- RRR, no murmur RESP-CTAB ABD-NABS,soft,NT,ND EXT- No edema Pulses- Radial, DP- 2+        Assessment & Plan:      Problem List Items Addressed This Visit    Prediabetes    Borderline diabetic she is lost a good amount of weight. We'll recheck her A1c today. Last A1c  was a little elevated at 6.8      Relevant Orders   CBC with Differential/Platelet (Completed)   Hemoglobin A1c (Completed)   COMPLETE METABOLIC PANEL WITH GFR (Completed)   Obesity (BMI 30-39.9)   HTN, goal below 130/80    Blood pressure well controlled medication medication      Relevant Medications   spironolactone (ALDACTONE) 25 MG tablet   rosuvastatin (CRESTOR) 10 MG tablet   losartan (COZAAR) 50 MG tablet   furosemide (LASIX) 20 MG tablet   amLODipine (NORVASC) 10 MG tablet   Other Relevant Orders   CBC with Differential/Platelet (Completed)   Hemoglobin A1c (Completed)   COMPLETE METABOLIC PANEL WITH GFR (Completed)   Hoarse voice quality    Recurrent hoarse voice, she wants to proceed with ENT, we have discussed this multiple times before Will also treat her allergic rhinitis      Allergic rhinitis - Primary    Start flonase, she can add anti-histamine if needed as well          Note: This dictation was prepared with Dragon dictation along with smaller phrase technology. Any transcriptional errors that result from this process are unintentional.

## 2016-01-04 NOTE — Patient Instructions (Signed)
Flonase for allergies and drainage Referral to ENT  We will call with labs F/U 4 months

## 2016-01-05 ENCOUNTER — Encounter: Payer: Self-pay | Admitting: Family Medicine

## 2016-01-05 LAB — HEMOGLOBIN A1C
Hgb A1c MFr Bld: 6.7 % — ABNORMAL HIGH (ref <5.7)
MEAN PLASMA GLUCOSE: 146 mg/dL — AB (ref <117)

## 2016-01-05 NOTE — Assessment & Plan Note (Signed)
Borderline diabetic she is lost a good amount of weight. We'll recheck her A1c today. Last A1c was a little elevated at 6.8

## 2016-01-05 NOTE — Assessment & Plan Note (Signed)
Start flonase, she can add anti-histamine if needed as well

## 2016-01-05 NOTE — Assessment & Plan Note (Signed)
Blood pressure well controlled medication medication 

## 2016-01-05 NOTE — Assessment & Plan Note (Signed)
Recurrent hoarse voice, she wants to proceed with ENT, we have discussed this multiple times before Will also treat her allergic rhinitis

## 2016-01-13 ENCOUNTER — Telehealth: Payer: Self-pay | Admitting: *Deleted

## 2016-01-13 NOTE — Telephone Encounter (Signed)
Submitted [employer] referral thru [employer] [facility] for authorization to Dr. Kasandra Knudsen Raynelle Bring, MD with authorization [id number]  Requesting provider: Neysa Hotter  Treating provider: Tamsen Roers Teoh,MD  Number of visits: 6  Start Date: 02/24/16  End Date: 08/22/16  Dx: R49.9- Unspecified voice and resonance disorder       J30.1- Allergic rhinitis due to pollen

## 2016-02-24 ENCOUNTER — Ambulatory Visit (INDEPENDENT_AMBULATORY_CARE_PROVIDER_SITE_OTHER): Payer: Commercial Managed Care - HMO | Admitting: Otolaryngology

## 2016-02-24 DIAGNOSIS — R490 Dysphonia: Secondary | ICD-10-CM

## 2016-02-24 DIAGNOSIS — K219 Gastro-esophageal reflux disease without esophagitis: Secondary | ICD-10-CM | POA: Diagnosis not present

## 2016-03-23 ENCOUNTER — Ambulatory Visit (INDEPENDENT_AMBULATORY_CARE_PROVIDER_SITE_OTHER): Payer: Commercial Managed Care - HMO | Admitting: Otolaryngology

## 2016-03-23 DIAGNOSIS — K219 Gastro-esophageal reflux disease without esophagitis: Secondary | ICD-10-CM | POA: Diagnosis not present

## 2016-03-23 DIAGNOSIS — R490 Dysphonia: Secondary | ICD-10-CM | POA: Diagnosis not present

## 2016-05-05 ENCOUNTER — Ambulatory Visit: Payer: Commercial Managed Care - HMO | Admitting: Family Medicine

## 2016-05-08 ENCOUNTER — Ambulatory Visit (INDEPENDENT_AMBULATORY_CARE_PROVIDER_SITE_OTHER): Payer: Commercial Managed Care - HMO | Admitting: Family Medicine

## 2016-05-08 ENCOUNTER — Encounter: Payer: Self-pay | Admitting: Family Medicine

## 2016-05-08 VITALS — BP 132/76 | HR 72 | Temp 97.8°F | Resp 16 | Ht 62.0 in | Wt 233.0 lb

## 2016-05-08 DIAGNOSIS — E1169 Type 2 diabetes mellitus with other specified complication: Secondary | ICD-10-CM

## 2016-05-08 DIAGNOSIS — E785 Hyperlipidemia, unspecified: Secondary | ICD-10-CM

## 2016-05-08 DIAGNOSIS — E119 Type 2 diabetes mellitus without complications: Secondary | ICD-10-CM | POA: Diagnosis not present

## 2016-05-08 DIAGNOSIS — M7702 Medial epicondylitis, left elbow: Secondary | ICD-10-CM

## 2016-05-08 DIAGNOSIS — E669 Obesity, unspecified: Secondary | ICD-10-CM | POA: Diagnosis not present

## 2016-05-08 DIAGNOSIS — I10 Essential (primary) hypertension: Secondary | ICD-10-CM | POA: Diagnosis not present

## 2016-05-08 LAB — HEMOGLOBIN A1C
HEMOGLOBIN A1C: 6.3 % — AB (ref <5.7)
MEAN PLASMA GLUCOSE: 134 mg/dL

## 2016-05-08 LAB — COMPREHENSIVE METABOLIC PANEL
ALBUMIN: 4 g/dL (ref 3.6–5.1)
ALK PHOS: 144 U/L — AB (ref 33–130)
ALT: 9 U/L (ref 6–29)
AST: 19 U/L (ref 10–35)
BUN: 14 mg/dL (ref 7–25)
CALCIUM: 9.1 mg/dL (ref 8.6–10.4)
CO2: 26 mmol/L (ref 20–31)
Chloride: 100 mmol/L (ref 98–110)
Creat: 0.76 mg/dL (ref 0.50–0.99)
GLUCOSE: 95 mg/dL (ref 70–99)
POTASSIUM: 4.6 mmol/L (ref 3.5–5.3)
Sodium: 137 mmol/L (ref 135–146)
Total Bilirubin: 0.6 mg/dL (ref 0.2–1.2)
Total Protein: 7.2 g/dL (ref 6.1–8.1)

## 2016-05-08 LAB — LIPID PANEL
CHOLESTEROL: 124 mg/dL — AB (ref 125–200)
HDL: 56 mg/dL (ref >=46)
LDL Cholesterol: 51 mg/dL (ref <130)
TRIGLYCERIDES: 86 mg/dL (ref <150)
Total CHOL/HDL Ratio: 2.2 Ratio (ref <=5.0)
VLDL: 17 mg/dL (ref <30)

## 2016-05-08 LAB — CBC WITH DIFFERENTIAL/PLATELET
BASOS ABS: 71 {cells}/uL (ref 0–200)
Basophils Relative: 1 %
EOS PCT: 6 %
Eosinophils Absolute: 426 cells/uL (ref 15–500)
HCT: 35.6 % (ref 35.0–45.0)
Hemoglobin: 11.7 g/dL — ABNORMAL LOW (ref 12.0–15.0)
LYMPHS ABS: 1775 {cells}/uL (ref 850–3900)
Lymphocytes Relative: 25 %
MCH: 27.9 pg (ref 27.0–33.0)
MCHC: 32.9 g/dL (ref 32.0–36.0)
MCV: 85 fL (ref 80.0–100.0)
MONOS PCT: 8 %
MPV: 9.3 fL (ref 7.5–12.5)
Monocytes Absolute: 568 cells/uL (ref 200–950)
NEUTROS ABS: 4260 {cells}/uL (ref 1500–7800)
Neutrophils Relative %: 60 %
Platelets: 258 10*3/uL (ref 140–400)
RBC: 4.19 MIL/uL (ref 3.80–5.10)
RDW: 14.7 % (ref 11.0–15.0)
WBC: 7.1 10*3/uL (ref 3.8–10.8)

## 2016-05-08 MED ORDER — ALPRAZOLAM 1 MG PO TABS: ORAL_TABLET | ORAL | 2 refills | Status: DC

## 2016-05-08 MED ORDER — GLUCOSE BLOOD VI STRP: ORAL_STRIP | 3 refills | Status: DC

## 2016-05-08 MED ORDER — NAPROXEN 500 MG PO TABS: 500.0000 mg | ORAL_TABLET | Freq: Two times a day (BID) | ORAL | 1 refills | Status: DC

## 2016-05-08 NOTE — Patient Instructions (Signed)
We will call with results Take aleve with food Ice the elbow  F/U 4 months

## 2016-05-08 NOTE — Progress Notes (Signed)
    Subjective:    Patient ID: [name] [name], female    DOB: 1956/03/05, 60 y.o.   MRN: [id number]  Patient presents for 4 month F/U (is fasting) Here follow-up chronic medical problem is. She is borderline diabetes in the setting of obesity she's been trying to work on her nutrition and her weight. Her last A1c was 6.7% even her diagnoses of diabetes mellitus we'll try low-dose metformin in the past she did not tolerate this as well as low-dose glipizide she had too much hypoglycemia. She did not bring her meter with her today. Last cholesterol panel done in November 2016 LDL that time 71 She is on crestor and ACEI She has complaining of left elbow pain on the medial aspect she does not memory particular injury but she does assist her mother with activities of daily living. She did take a dose of ibuprofen which helps she feels like there is a burning sensation at her elbow he has not noted any bruising Review Of Systems:  GEN- denies fatigue, fever, weight loss,weakness, recent illness HEENT- denies eye drainage, change in vision, nasal discharge, CVS- denies chest pain, palpitations RESP- denies SOB, cough, wheeze ABD- denies N/V, change in stools, abd pain GU- denies dysuria, hematuria, dribbling, incontinence MSK- denies joint pain, muscle aches, injury Neuro- denies headache, dizziness, syncope, seizure activity       Objective:    BP 132/76 (BP Location: Right Arm, Patient Position: Sitting, Cuff Size: Large)   Pulse 72   Temp 97.8 F (36.6 C) (Oral)   Resp 16   Ht 5\' 2"  (1.575 m)   Wt 233 lb (105.7 kg)   LMP 06/03/2012   BMI 42.62 kg/m  GEN- NAD, alert and oriented x3 HEENT- PERRL, EOMI, non injected sclera, pink conjunctiva, MMM, oropharynx clear CVS- RRR, no murmur RESP-CTAB MSK- FROM upper ext, TTP medial epicondyle, no swelling,no warmth, no bruising, FROM wrist  EXT- trace pedal edema Pulses- Radial, , DP 2+        Assessment & Plan:      Problem List  Items Addressed This Visit    Obesity (BMI 30-39.9) - Primary    Continue to work on healthy eating and diet She also needs regular aerobic exercise      Hyperlipidemia   Relevant Orders   Lipid panel   Diabetes mellitus type 2 in obese (HCC)    Diabetes mellitus goals A1c below 7% along with weight loss. Her last A1c was at 6.7%. The closer she gets a 7% we will need to start low-dose medication to help control her blood glucose      Relevant Orders   CBC with Differential/Platelet   Comprehensive metabolic panel   Hemoglobin A1c   HM DIABETES FOOT EXAM (Completed)    Other Visit Diagnoses    Medial epicondylitis of elbow, left       trial of naprosyn BID, ICE, decrease strenous use, hold on imaging   Relevant Medications   naproxen (NAPROSYN) 500 MG tablet      Note: This dictation was prepared with Dragon dictation along with smaller phrase technology. Any transcriptional errors that result from this process are unintentional.

## 2016-05-08 NOTE — Assessment & Plan Note (Signed)
Diabetes mellitus goals A1c below 7% along with weight loss. Her last A1c was at 6.7%. The closer she gets a 7% we will need to start low-dose medication to help control her blood glucose

## 2016-05-08 NOTE — Assessment & Plan Note (Signed)
Continue to work on healthy eating and diet She also needs regular aerobic exercise

## 2016-05-11 ENCOUNTER — Other Ambulatory Visit: Payer: Self-pay | Admitting: *Deleted

## 2016-05-11 MED ORDER — GLUCOSE BLOOD VI STRP: ORAL_STRIP | 3 refills | Status: DC

## 2016-05-11 NOTE — Addendum Note (Signed)
Addended by: Sheral Flow on: 05/11/2016 03:40 PM   Modules accepted: Orders

## 2016-05-22 ENCOUNTER — Ambulatory Visit (INDEPENDENT_AMBULATORY_CARE_PROVIDER_SITE_OTHER): Payer: Commercial Managed Care - HMO | Admitting: Otolaryngology

## 2016-05-22 DIAGNOSIS — K219 Gastro-esophageal reflux disease without esophagitis: Secondary | ICD-10-CM | POA: Diagnosis not present

## 2016-05-22 DIAGNOSIS — R490 Dysphonia: Secondary | ICD-10-CM

## 2016-05-24 ENCOUNTER — Other Ambulatory Visit: Payer: Self-pay | Admitting: Family Medicine

## 2016-05-24 DIAGNOSIS — Z1231 Encounter for screening mammogram for malignant neoplasm of breast: Secondary | ICD-10-CM

## 2016-06-02 ENCOUNTER — Telehealth: Payer: Self-pay | Admitting: *Deleted

## 2016-06-02 DIAGNOSIS — M7702 Medial epicondylitis, left elbow: Secondary | ICD-10-CM

## 2016-06-02 NOTE — Telephone Encounter (Signed)
Received call from patient.   Requested referral orders placed to [facility] for epicondylitis of left elbow.   Reports that pain has not improved.   MD please advise.

## 2016-06-02 NOTE — Telephone Encounter (Signed)
okay

## 2016-06-02 NOTE — Telephone Encounter (Signed)
Referral orders placed

## 2016-06-15 DIAGNOSIS — G5622 Lesion of ulnar nerve, left upper limb: Secondary | ICD-10-CM | POA: Diagnosis not present

## 2016-06-15 DIAGNOSIS — M25522 Pain in left elbow: Secondary | ICD-10-CM | POA: Diagnosis not present

## 2016-06-15 DIAGNOSIS — M7702 Medial epicondylitis, left elbow: Secondary | ICD-10-CM | POA: Diagnosis not present

## 2016-06-15 DIAGNOSIS — M25622 Stiffness of left elbow, not elsewhere classified: Secondary | ICD-10-CM | POA: Diagnosis not present

## 2016-06-20 ENCOUNTER — Telehealth (HOSPITAL_COMMUNITY): Payer: Self-pay

## 2016-06-20 NOTE — Telephone Encounter (Signed)
06/20/16 has a referral for her arm and wants to schedule around the same times as her mom's appts.  Will bring in referral on 9/7

## 2016-06-26 ENCOUNTER — Ambulatory Visit (HOSPITAL_COMMUNITY): Payer: Commercial Managed Care - HMO

## 2016-07-03 ENCOUNTER — Telehealth: Payer: Self-pay | Admitting: Family Medicine

## 2016-07-03 NOTE — Telephone Encounter (Signed)
Received fax from [facility] back Auth# [id number] Treating Provider Dr. Verda Cumins # of Visits 6 Start Date 06/15/2016 End Date 12/12/2016 CPT 99499 DX M77.02

## 2016-07-12 ENCOUNTER — Ambulatory Visit (HOSPITAL_COMMUNITY)
Admission: RE | Admit: 2016-07-12 | Discharge: 2016-07-12 | Disposition: A | Discharge status: Home / Self Care | Payer: Commercial Managed Care - HMO | Source: Ambulatory Visit | Attending: Family Medicine | Admitting: Family Medicine

## 2016-07-12 ENCOUNTER — Ambulatory Visit (HOSPITAL_COMMUNITY): Payer: Commercial Managed Care - HMO

## 2016-07-12 ENCOUNTER — Other Ambulatory Visit: Payer: Self-pay | Admitting: Family Medicine

## 2016-07-12 DIAGNOSIS — Z1231 Encounter for screening mammogram for malignant neoplasm of breast: Secondary | ICD-10-CM

## 2016-07-19 ENCOUNTER — Ambulatory Visit (INDEPENDENT_AMBULATORY_CARE_PROVIDER_SITE_OTHER): Payer: Commercial Managed Care - HMO | Admitting: Family Medicine

## 2016-07-19 ENCOUNTER — Encounter: Payer: Self-pay | Admitting: Family Medicine

## 2016-07-19 VITALS — BP 132/70 | HR 88 | Temp 97.9°F | Resp 16 | Ht 62.0 in | Wt 241.0 lb

## 2016-07-19 DIAGNOSIS — B029 Zoster without complications: Secondary | ICD-10-CM | POA: Diagnosis not present

## 2016-07-19 MED ORDER — VALACYCLOVIR HCL 1 G PO TABS: 1000.0000 mg | ORAL_TABLET | Freq: Three times a day (TID) | ORAL | 0 refills | Status: DC

## 2016-07-19 MED ORDER — HYDROCODONE-ACETAMINOPHEN 5-325 MG PO TABS: 1.0000 | ORAL_TABLET | Freq: Four times a day (QID) | ORAL | 0 refills | Status: DC | PRN

## 2016-07-19 NOTE — Progress Notes (Signed)
   Subjective:    Patient ID: [name] [name], female    DOB: 08-20-1956, 60 y.o.   MRN: [id number]  Patient presents for Rash (x2 days- blistered rash to L side of shoulder, back, neck- reports itching and burning to areas)  Patient here with blistering rash to the left side of her neck. Initially started as some tingling and redness about a week before the blisters popped up 2 days ago. They have spread down from the nape of her neck to her shoulder. It is very painful she feels like things are crawling underneath her skin. She's not had any fever no recent illness. Shingles shot was not covered by her insurance. She has not put anything on the lesions.   Review Of Systems:  GEN- denies fatigue, fever, weight loss,weakness, recent illness HEENT- denies eye drainage, change in vision, nasal discharge, CVS- denies chest pain, palpitations RESP- denies SOB, cough, wheeze ABD- denies N/V, change in stools, abd pain GU- denies dysuria, hematuria, dribbling, incontinence MSK- denies joint pain, muscle aches, injury Neuro- denies headache, dizziness, syncope, seizure activity       Objective:    BP 132/70 (BP Location: Right Arm, Patient Position: Sitting, Cuff Size: Large)   Pulse 88   Temp 97.9 F (36.6 C) (Oral)   Resp 16   Ht 5\' 2"  (1.575 m)   Wt 241 lb (109.3 kg)   LMP 06/03/2012   BMI 44.08 kg/m  GEN- NAD, alert and oriented x3 Neck- supple, no LAD Skin- erythematous based small vesicles cropped together at nape of neck, few patches along dermatome to her shoulder, TTP, no pustules        Assessment & Plan:      Problem List Items Addressed This Visit    None    Visit Diagnoses    Herpes zoster without complication    -  Primary    Valtrex x 7 days, Norco given for pain,. discussed the virus and how it spreads, no sign of superinfection   Relevant Medications   valACYclovir (VALTREX) 1000 MG tablet      Note: This dictation was prepared with Dragon dictation along  with smaller phrase technology. Any transcriptional errors that result from this process are unintentional.

## 2016-07-19 NOTE — Patient Instructions (Signed)
Take Valtrex as prescribed  Pain medication  F/U as previous

## 2016-07-20 ENCOUNTER — Other Ambulatory Visit: Payer: Self-pay | Admitting: Family Medicine

## 2016-07-20 ENCOUNTER — Telehealth: Payer: Self-pay | Admitting: Family Medicine

## 2016-07-20 NOTE — Telephone Encounter (Signed)
[name] [name] called asking if she's at risk of the Shingles vaccine having a negative reaction with her current medications. She's called [employer] and was told they'll cover the vaccination. Please give her a call regarding this.  Pt's ph# [phone] and [phone] Thank you.

## 2016-07-20 NOTE — Telephone Encounter (Signed)
Ok to refill Xanax?  Last OV 07/19/2016.

## 2016-07-20 NOTE — Telephone Encounter (Signed)
Call placed to patient.   Advised that she cannot take Shingles vaccine while in active outbreak of Shingles.   MD to be made aware.

## 2016-07-21 NOTE — Telephone Encounter (Signed)
okay

## 2016-07-21 NOTE — Telephone Encounter (Signed)
Okay to refill xanax and other meds

## 2016-07-25 ENCOUNTER — Other Ambulatory Visit: Payer: Self-pay | Admitting: Family Medicine

## 2016-07-25 MED ORDER — ALPRAZOLAM 1 MG PO TABS: 1.0000 mg | ORAL_TABLET | Freq: Two times a day (BID) | ORAL | 2 refills | Status: DC | PRN

## 2016-07-25 NOTE — Telephone Encounter (Signed)
Medication refilled per protocol.  Xanax faxed to pharmacy

## 2016-07-25 NOTE — Telephone Encounter (Signed)
Refill from earlier was disposed of.  Reprinted for faxing

## 2016-09-11 ENCOUNTER — Encounter: Payer: Self-pay | Admitting: Family Medicine

## 2016-09-11 ENCOUNTER — Ambulatory Visit (INDEPENDENT_AMBULATORY_CARE_PROVIDER_SITE_OTHER): Payer: Commercial Managed Care - HMO | Admitting: Family Medicine

## 2016-09-11 VITALS — BP 138/74 | HR 66 | Temp 98.6°F | Resp 16 | Ht 62.0 in | Wt 246.0 lb

## 2016-09-11 DIAGNOSIS — E1169 Type 2 diabetes mellitus with other specified complication: Secondary | ICD-10-CM | POA: Diagnosis not present

## 2016-09-11 DIAGNOSIS — E7800 Pure hypercholesterolemia, unspecified: Secondary | ICD-10-CM | POA: Diagnosis not present

## 2016-09-11 DIAGNOSIS — E669 Obesity, unspecified: Secondary | ICD-10-CM

## 2016-09-11 DIAGNOSIS — Z23 Encounter for immunization: Secondary | ICD-10-CM | POA: Diagnosis not present

## 2016-09-11 DIAGNOSIS — I10 Essential (primary) hypertension: Secondary | ICD-10-CM | POA: Diagnosis not present

## 2016-09-11 LAB — BASIC METABOLIC PANEL
BUN: 18 mg/dL (ref 7–25)
CHLORIDE: 100 mmol/L (ref 98–110)
CO2: 31 mmol/L (ref 20–31)
Calcium: 9.5 mg/dL (ref 8.6–10.4)
Creat: 0.92 mg/dL (ref 0.50–0.99)
Glucose, Bld: 98 mg/dL (ref 70–99)
POTASSIUM: 4.8 mmol/L (ref 3.5–5.3)
SODIUM: 138 mmol/L (ref 135–146)

## 2016-09-11 LAB — HEMOGLOBIN A1C
HEMOGLOBIN A1C: 6.1 % — AB (ref <5.7)
Mean Plasma Glucose: 128 mg/dL

## 2016-09-11 MED ORDER — ALPRAZOLAM 1 MG PO TABS: 1.0000 mg | ORAL_TABLET | Freq: Two times a day (BID) | ORAL | 2 refills | Status: DC | PRN

## 2016-09-11 MED ORDER — FUROSEMIDE 20 MG PO TABS: 20.0000 mg | ORAL_TABLET | Freq: Every day | ORAL | 3 refills | Status: DC | PRN

## 2016-09-11 NOTE — Assessment & Plan Note (Signed)
Controlled, no changes Check labs today for A1C and renal function Diabetes diet controlled Reiterated dietary changes which has been a big issue for her

## 2016-09-11 NOTE — Progress Notes (Signed)
   Subjective:    Patient ID: [name] [name], female    DOB: [birth year], 60 y.o.   MRN: [id number]  Patient presents for 4 month F/U (is fasting)   DM- diet controlled, last A1C 6.3%, on Crestor for Hyperlipidemia Last LDL 51  Weight up 5lbs  Treated for shingles last visit , no post herpetic pain   Medications reviewed Due for flu shot today   Hypertension-taking BP meds as prescribed     Review Of Systems:  GEN- denies fatigue, fever, weight loss,weakness, recent illness HEENT- denies eye drainage, change in vision, nasal discharge, CVS- denies chest pain, palpitations RESP- denies SOB, cough, wheeze ABD- denies N/V, change in stools, abd pain GU- denies dysuria, hematuria, dribbling, incontinence MSK- denies joint pain, muscle aches, injury Neuro- denies headache, dizziness, syncope, seizure activity       Objective:    BP 138/74 (BP Location: Right Arm, Patient Position: Sitting, Cuff Size: Large)   Pulse 66   Temp 98.6 F (37 C) (Oral)   Resp 16   Ht 5\' 2"  (1.575 m)   Wt 246 lb (111.6 kg)   LMP 06/03/2012   SpO2 97%   BMI 44.99 kg/m  GEN- NAD, alert and oriented x3 HEENT- PERRL, EOMI, non injected sclera, pink conjunctiva, MMM, oropharynx clear Neck- Supple, no thyromegaly CVS- RRR, no murmur RESP-CTAB EXT- No edema Pulses- Radial, DP- 2+        Assessment & Plan:      Problem List Items Addressed This Visit    Obesity (BMI 30-39.9) - Primary   Hyperlipidemia    Cholesterol at goal with crestor       Relevant Medications   furosemide (LASIX) 20 MG tablet   HTN, goal below 130/80    Controlled, no changes Check labs today for A1C and renal function Diabetes diet controlled Reiterated dietary changes which has been a big issue for her       Relevant Medications   furosemide (LASIX) 20 MG tablet   Other Relevant Orders   Basic metabolic panel   Hemoglobin A1c   Diabetes mellitus type 2 in obese (HCC)   Relevant Orders   Basic metabolic  panel   Hemoglobin A1c    Other Visit Diagnoses    Need for prophylactic vaccination and inoculation against influenza       Relevant Orders   Flu Vaccine QUAD 36+ mos IM (Completed)      Note: This dictation was prepared with Dragon dictation along with smaller phrase technology. Any transcriptional errors that result from this process are unintentional.

## 2016-09-11 NOTE — Assessment & Plan Note (Signed)
Cholesterol at goal with crestor

## 2016-09-11 NOTE — Patient Instructions (Signed)
Shingles Vaccine sent to pharmacy Work on dietary changes We will call with lab results F/U 4 months

## 2016-09-12 ENCOUNTER — Other Ambulatory Visit: Payer: Self-pay | Admitting: *Deleted

## 2016-09-12 MED ORDER — ZOSTER VACCINE LIVE 19400 UNT/0.65ML ~~LOC~~ SUSR: 0.6500 mL | Freq: Once | SUBCUTANEOUS | 0 refills | Status: AC

## 2016-09-13 ENCOUNTER — Encounter: Payer: Self-pay | Admitting: Family Medicine

## 2016-09-18 ENCOUNTER — Ambulatory Visit (INDEPENDENT_AMBULATORY_CARE_PROVIDER_SITE_OTHER): Payer: Commercial Managed Care - HMO | Admitting: Otolaryngology

## 2016-09-24 ENCOUNTER — Other Ambulatory Visit: Payer: Self-pay | Admitting: Family Medicine

## 2016-09-26 ENCOUNTER — Other Ambulatory Visit: Payer: Self-pay | Admitting: Family Medicine

## 2016-10-19 ENCOUNTER — Ambulatory Visit (INDEPENDENT_AMBULATORY_CARE_PROVIDER_SITE_OTHER): Payer: Commercial Managed Care - HMO | Admitting: Otolaryngology

## 2016-10-23 ENCOUNTER — Ambulatory Visit (INDEPENDENT_AMBULATORY_CARE_PROVIDER_SITE_OTHER): Payer: Medicare HMO | Admitting: Family Medicine

## 2016-10-23 ENCOUNTER — Encounter: Payer: Self-pay | Admitting: Family Medicine

## 2016-10-23 VITALS — BP 110/80 | HR 60 | Temp 98.8°F | Resp 18 | Wt 248.0 lb

## 2016-10-23 DIAGNOSIS — J111 Influenza due to unidentified influenza virus with other respiratory manifestations: Secondary | ICD-10-CM | POA: Diagnosis not present

## 2016-10-23 MED ORDER — OSELTAMIVIR PHOSPHATE 75 MG PO CAPS: 75.0000 mg | ORAL_CAPSULE | Freq: Two times a day (BID) | ORAL | 0 refills | Status: DC

## 2016-10-23 NOTE — Progress Notes (Signed)
Subjective:    Patient ID: [name] [name], female    DOB: 20-May-1956, 61 y.o.   MRN: [id number]  HPI  Patient presents today with 3 days of diffuse body aches, subjective fevers, fatigue, head congestion, rhinorrhea, and nonproductive cough. Symptoms are consistent with the flu. She denies any shortness of breath or chest pain. Unfortunately we have run out of flu test and therefore I cannot verify this symptomatically she appears to have the flu Past Medical History:  Diagnosis Date  . Anxiety   . Cancer ([place])   . Chronic abdominal pain   . Constipation   . Depression   . Diabetes mellitus, type 2 ([place])   . Essential hypertension, benign   . GERD (gastroesophageal reflux disease)   . Glaucoma    Last eye exam 9/08  . Hiatal hernia   . HSV-2 seropositive   . Iron deficiency anemia   . Low back pain   . Mixed hyperlipidemia   . Osteoarthritis   . Positive H. pylori test   . Schatzki's ring   . TIA (transient ischemic attack)    6/10  . Varicose veins    Past Surgical History:  Procedure Laterality Date  . ABDOMINAL HYSTERECTOMY  08/01/2012   total and BSO due uterine cancer  . CHOLECYSTECTOMY  Dec 26, 2010  . COLONOSCOPY  01/2006  . DILATION AND CURETTAGE OF UTERUS  06/12/12  . ESOPHAGOGASTRODUODENOSCOPY     Esophageal dilation 2007  . Lumbar discetomy  10/01   Dr. Larose Hires   . SPINE SURGERY    . TUBAL LIGATION  1994  . UMBILICAL HERNIA REPAIR  12/26/2010   Current Outpatient Prescriptions on File Prior to Visit  Medication Sig Dispense Refill  . ACCU-CHEK FASTCLIX LANCETS MISC USE TO MONITOR FASTING BLOOD SUGAR ONE TIME DAILY 102 each 3  . Alcohol Swabs (B-D SINGLE USE SWABS REGULAR) PADS USE ONE TIME DAILY TO MONITOR FINGERSTICK BLOOD SUGAR 100 each 3  . ALPRAZolam (XANAX) 1 MG tablet Take 1 tablet (1 mg total) by mouth 2 (two) times daily as needed. 45 tablet 2  . amLODipine (NORVASC) 10 MG tablet TAKE 1 TABLET (10 MG TOTAL) BY MOUTH EVERY MORNING. 90 tablet 1  .  aspirin 325 MG tablet Take 325 mg by mouth every morning.     . Blood Glucose Monitoring Suppl W/DEVICE KIT Dispense based on patient and insurance preference. Use to monitor FSBS 1x daily. E11.9. 1 each 3  . Cholecalciferol (VITAMIN D-3) 1000 UNITS CAPS Take by mouth.    . fluticasone (FLONASE) 50 MCG/ACT nasal spray Place 2 sprays into both nostrils daily. 48 g 3  . furosemide (LASIX) 20 MG tablet Take 1 tablet (20 mg total) by mouth daily as needed. 90 tablet 3  . glucose blood (ACCU-CHEK SMARTVIEW) test strip TEST FASTING BLOOD SUGAR TWICE PER DAY 100 each 3  . HYDROcodone-acetaminophen (NORCO) 5-325 MG tablet Take 1 tablet by mouth every 6 (six) hours as needed for moderate pain. 30 tablet 0  . Lancet Devices (ADJUSTABLE LANCING DEVICE) MISC Dispense based on patient and insurance preference. Use to monitor FSBS 1x daily. E11.9. 1 each 0  . losartan (COZAAR) 50 MG tablet TAKE 1 TABLET EVERY DAY 90 tablet 1  . Multiple Vitamins-Minerals (CENTRUM ADULTS PO) Take by mouth.    . naproxen (NAPROSYN) 500 MG tablet TAKE 1 TABLET TWICE DAILY WITH MEALS 180 tablet 1  . omeprazole (PRILOSEC) 20 MG capsule TAKE 1 CAPSULE EVERY DAY 90 capsule  1  . polyethylene glycol powder (GLYCOLAX/MIRALAX) powder MIX 1 CAPFUL (17GM) IN 8 OUNCES OF WATER AND DRINK EVERY DAY 1581 g 1  . rosuvastatin (CRESTOR) 10 MG tablet TAKE 1 TABLET AT BEDTIME 90 tablet 1  . spironolactone (ALDACTONE) 25 MG tablet TAKE 1 TABLET EVERY MORNING 90 tablet 1  . triamcinolone cream (KENALOG) 0.1 % APPLY TOPICALLY TO VULVA 2 TIMES DAILY 45 g 3  . valACYclovir (VALTREX) 1000 MG tablet Take 1 tablet (1,000 mg total) by mouth 3 (three) times daily. 21 tablet 0   No current facility-administered medications on file prior to visit.    Allergies  Allergen Reactions  . Contrast Media [Iodinated Diagnostic Agents] Hives  . Lisinopril     REACTION: cough  . Buspar [Buspirone Hcl] Palpitations   Social History   Social History  . Marital  status: Widowed    Spouse name: N/A  . Number of children: N/A  . Years of education: N/A   Occupational History  . Not on file.   Social History Main Topics  . Smoking status: Never Smoker  . Smokeless tobacco: Never Used  . Alcohol use No  . Drug use: No  . Sexual activity: Not Currently   Other Topics Concern  . Not on file   Social History Narrative   Lives with children     Review of Systems  All other systems reviewed and are negative.      Objective:   Physical Exam  Constitutional: She appears well-developed and well-nourished. No distress.  HENT:  Head: Normocephalic and atraumatic.  Right Ear: External ear normal.  Left Ear: External ear normal.  Nose: Mucosal edema and rhinorrhea present.  Mouth/Throat: Oropharynx is clear and moist. No oropharyngeal exudate.  Eyes: Conjunctivae are normal.  Neck: Neck supple.  Cardiovascular: Normal rate, regular rhythm and normal heart sounds.   Pulmonary/Chest: Effort normal and breath sounds normal. No respiratory distress. She has no wheezes. She has no rales.  Lymphadenopathy:    She has no cervical adenopathy.  Skin: She is not diaphoretic.  Vitals reviewed.         Assessment & Plan:  Influenza with respiratory manifestation - Plan: oseltamivir (TAMIFLU) 75 MG capsule  Clinically the patient appears to have influenza. Begin Tamiflu 75 mg by mouth twice a day for 5 days. Supportive care. Recheck in one week if no better or sooner if worse

## 2016-10-31 ENCOUNTER — Telehealth: Payer: Self-pay | Admitting: *Deleted

## 2016-10-31 NOTE — Telephone Encounter (Signed)
Call placed to patient and patient made aware.   Appointment scheduled.  

## 2016-10-31 NOTE — Telephone Encounter (Signed)
The flu typically takes 7-10 days to run its course. If she's not better she needs to be seen to make sure she doesn't have pneumonia. Therefore she should be seen tomorrow and reevaluated. Unfortunately she is doing worse there is no way to tell over the phone what the cause is without seeing her

## 2016-10-31 NOTE — Telephone Encounter (Signed)
Send to Dr. Dennard Schaumann who evaluated

## 2016-10-31 NOTE — Telephone Encounter (Signed)
Received call from patient.   Reports that she was seen by Dr. Dennard Schaumann on 10/23/2016 and Dx: flu. Given Tamiflu. Reports that she has completed anti-viral, but she continues to "feel horrible". States that she continues to experience body aches, nonproductive cough, and fatigue.   Advised that Dr. Samella Parr recommendation was to returne if sx fail to resolve. States that she is concerned she will not be able to come out d/t impending inclement weather.    MD please advise.

## 2016-11-03 ENCOUNTER — Ambulatory Visit (INDEPENDENT_AMBULATORY_CARE_PROVIDER_SITE_OTHER): Payer: Medicare HMO | Admitting: Family Medicine

## 2016-11-03 ENCOUNTER — Encounter: Payer: Self-pay | Admitting: Family Medicine

## 2016-11-03 VITALS — BP 128/78 | HR 78 | Temp 98.5°F | Resp 18 | Ht 62.0 in | Wt 251.0 lb

## 2016-11-03 DIAGNOSIS — J209 Acute bronchitis, unspecified: Secondary | ICD-10-CM | POA: Diagnosis not present

## 2016-11-03 DIAGNOSIS — J0100 Acute maxillary sinusitis, unspecified: Secondary | ICD-10-CM

## 2016-11-03 MED ORDER — BENZONATATE 200 MG PO CAPS: 200.0000 mg | ORAL_CAPSULE | Freq: Three times a day (TID) | ORAL | 0 refills | Status: DC | PRN

## 2016-11-03 MED ORDER — PREDNISONE 20 MG PO TABS: 40.0000 mg | ORAL_TABLET | Freq: Every day | ORAL | 0 refills | Status: DC

## 2016-11-03 MED ORDER — AMOXICILLIN-POT CLAVULANATE 875-125 MG PO TABS: 1.0000 | ORAL_TABLET | Freq: Two times a day (BID) | ORAL | 0 refills | Status: DC

## 2016-11-03 NOTE — Progress Notes (Signed)
   Subjective:    Patient ID: [name] [name], female    DOB: 07-Sep-1956, 61 y.o.   MRN: [id number]  Patient presents for Follow-up (illness- hsa been seen and treated for Flu with no relief)  Patient here with persistent symptoms of cough with production she was seen on January 8 with flulike symptoms diagnosed with influenza we did not have testing available at that time she completed a course of Tamiflu.  Here now with Sinus pressure or drainage headache soreness in her chest from coughing. She's also had some wheezing with her cough. She has not had any further fever but had some chills. She does not feel like she is improved at all despite treatment for influenza. Positive sick contacts with her mother. She has not had any GI symptoms her appetite is good she is drinking well.    Review Of Systems:  GEN- denies fatigue, fever, weight loss,weakness, recent illness HEENT- denies eye drainage, change in vision,+ nasal discharge, CVS- denies chest pain, palpitations RESP- denies SOB,+ cough, +wheeze ABD- denies N/V, change in stools, abd pain GU- denies dysuria, hematuria, dribbling, incontinence MSK- denies joint pain, muscle aches, injury Neuro- denies headache, dizziness, syncope, seizure activity       Objective:    BP 128/78 (BP Location: Right Arm, Patient Position: Sitting, Cuff Size: Large)   Pulse 78   Temp 98.5 F (36.9 C) (Oral)   Resp 18   Ht 5\' 2"  (1.575 m)   Wt 251 lb (113.9 kg)   LMP 06/03/2012   SpO2 98%   BMI 45.91 kg/m  GEN- NAD, alert and oriented x3 HEENT- PERRL, EOMI, non injected sclera, pink conjunctiva, MMM, oropharynx mild injection, TM clear bilat no effusion,  + maxillary sinus tenderness, inflammed turbinates,  Nasal drainage  Neck- Supple, no LAD CVS- RRR, no murmur RESP-rhonchi right lower lobe, no wheeze, normal WOB  EXT- No edema Pulses- Radial 2+         Assessment & Plan:      Problem List Items Addressed This Visit    None     Visit Diagnoses    Acute maxillary sinusitis, recurrence not specified    -  Primary   possible recent flu now with sinusitis/bronchitis symptoms, add augmentin, prednisone, tessalon perrles, no fever, oxygen sat normal, keep hydrated. CXR if not improved  She is at risk for PNA with progression of symptoms and possible seqquela of influenza   Relevant Medications   amoxicillin-clavulanate (AUGMENTIN) 875-125 MG tablet   benzonatate (TESSALON) 200 MG capsule   predniSONE (DELTASONE) 20 MG tablet   Acute bronchitis, unspecified organism          Note: This dictation was prepared with Dragon dictation along with smaller phrase technology. Any transcriptional errors that result from this process are unintentional.

## 2016-11-03 NOTE — Patient Instructions (Signed)
Take antibiotics Take prednisone Cough pills  F/U as needed

## 2016-11-21 ENCOUNTER — Encounter: Payer: Self-pay | Admitting: Family Medicine

## 2016-11-21 ENCOUNTER — Ambulatory Visit (INDEPENDENT_AMBULATORY_CARE_PROVIDER_SITE_OTHER): Payer: Medicare HMO | Admitting: Family Medicine

## 2016-11-21 VITALS — BP 124/78 | HR 74 | Temp 97.9°F | Resp 16 | Wt 250.8 lb

## 2016-11-21 DIAGNOSIS — J0101 Acute recurrent maxillary sinusitis: Secondary | ICD-10-CM | POA: Diagnosis not present

## 2016-11-21 DIAGNOSIS — R5383 Other fatigue: Secondary | ICD-10-CM

## 2016-11-21 MED ORDER — AMLODIPINE BESYLATE 10 MG PO TABS
10.0000 mg | ORAL_TABLET | Freq: Every morning | ORAL | 1 refills | Status: DC
Start: 2016-11-21 — End: 2016-11-28

## 2016-11-21 MED ORDER — LOSARTAN POTASSIUM 50 MG PO TABS: 50.0000 mg | ORAL_TABLET | Freq: Every day | ORAL | 1 refills | Status: DC

## 2016-11-21 MED ORDER — AZITHROMYCIN 250 MG PO TABS: ORAL_TABLET | ORAL | 0 refills | Status: DC

## 2016-11-21 MED ORDER — OMEPRAZOLE 20 MG PO CPDR: 20.0000 mg | DELAYED_RELEASE_CAPSULE | Freq: Every day | ORAL | 1 refills | Status: DC

## 2016-11-21 MED ORDER — ROSUVASTATIN CALCIUM 10 MG PO TABS: 10.0000 mg | ORAL_TABLET | Freq: Every day | ORAL | 1 refills | Status: DC

## 2016-11-21 MED ORDER — SPIRONOLACTONE 25 MG PO TABS: 25.0000 mg | ORAL_TABLET | Freq: Every morning | ORAL | 1 refills | Status: DC

## 2016-11-21 NOTE — Patient Instructions (Addendum)
Take zpak Take the allergy pill to dry you up  We will call with lab results  Take Vitamin C and Vitamin B12  F/U as previous

## 2016-11-21 NOTE — Progress Notes (Signed)
   Subjective:    Patient ID: [name] [name], female    DOB: Sep 12, 1956, 61 y.o.   MRN: [id number]  Patient presents for Fatigue and chest congestion  Patient here with ongoing fatigue and chest congestion. She was treated at the beginning of January for presumptive influenza she then returned a little for week later 1/19 with persistent symptoms of cough with production sinus drainage and some wheezing. He had multiple sick contacts. At the last visit I treated with Augmentin as well as Tessalon Perles and prednisone recommended a chest x-ray be done if she did not improve. Diagnosis that that time was acute bronchitis and acute sinusitis.  Her cough has now resolved, no recent SOB or wheezing. Still has sinus pressure, drainage, headache, feels congested and just sore all over. No fever, appetite is good. No longer taking OTC meds.  Blood sugars have been good, last A1C 6.1% diet controlled     Review Of Systems:  GEN- +fatigue, fever, weight loss,weakness, recent illness HEENT- denies eye drainage, change in vision, +nasal discharge, CVS- denies chest pain, palpitations RESP- denies SOB, cough, wheeze ABD- denies N/V, change in stools, abd pain GU- denies dysuria, hematuria, dribbling, incontinence MSK- denies joint pain, muscle aches, injury Neuro- denies headache, dizziness, syncope, seizure activity       Objective:    BP 124/78   Pulse 74   Temp 97.9 F (36.6 C) (Oral)   Resp 16   Wt 250 lb 12.8 oz (113.8 kg)   LMP 06/03/2012   SpO2 98%   BMI 45.87 kg/m  GEN- NAD, alert and oriented x3 HEENT- PERRL, EOMI, non injected sclera, pink conjunctiva, MMM, oropharynx mild injection, TM clear bilat no effusion,  + maxillary sinus tenderness, inflammed turbinates,  Nasal drainage  Neck- Supple, shotty submandibular LAD CVS- RRR, no murmur RESP-CTAB, normal WOB  EXT- No edema Pulses- Radial 2            Assessment & Plan:      Problem List Items Addressed This Visit     None    Visit Diagnoses    Acute recurrent maxillary sinusitis    -  Primary   sinusitis has not resolved, bronchitis has, treat with zpak, given xyzal continue nasal spray,    Relevant Medications   azithromycin (ZITHROMAX) 250 MG tablet   Other Relevant Orders   CBC with Differential/Platelet   Comprehensive metabolic panel   Other fatigue       check labs with such severe fatigue, likely MTF from her illness    Relevant Orders   CBC with Differential/Platelet   Comprehensive metabolic panel      Note: This dictation was prepared with Dragon dictation along with smaller phrase technology. Any transcriptional errors that result from this process are unintentional.

## 2016-11-22 LAB — CBC WITH DIFFERENTIAL/PLATELET
Basophils Absolute: 0 cells/uL (ref 0–200)
Basophils Relative: 0 %
EOS ABS: 201 {cells}/uL (ref 15–500)
Eosinophils Relative: 3 %
HEMATOCRIT: 36.4 % (ref 35.0–45.0)
Hemoglobin: 11.8 g/dL — ABNORMAL LOW (ref 12.0–15.0)
Lymphocytes Relative: 21 %
Lymphs Abs: 1407 cells/uL (ref 850–3900)
MCH: 28.4 pg (ref 27.0–33.0)
MCHC: 32.4 g/dL (ref 32.0–36.0)
MCV: 87.7 fL (ref 80.0–100.0)
MONOS PCT: 7 %
MPV: 9.1 fL (ref 7.5–12.5)
Monocytes Absolute: 469 cells/uL (ref 200–950)
NEUTROS PCT: 69 %
Neutro Abs: 4623 cells/uL (ref 1500–7800)
Platelets: 231 10*3/uL (ref 140–400)
RBC: 4.15 MIL/uL (ref 3.80–5.10)
RDW: 14.5 % (ref 11.0–15.0)
WBC: 6.7 10*3/uL (ref 3.8–10.8)

## 2016-11-22 LAB — COMPREHENSIVE METABOLIC PANEL
ALT: 11 U/L (ref 6–29)
AST: 16 U/L (ref 10–35)
Albumin: 3.7 g/dL (ref 3.6–5.1)
Alkaline Phosphatase: 104 U/L (ref 33–130)
BUN: 14 mg/dL (ref 7–25)
CALCIUM: 9.2 mg/dL (ref 8.6–10.4)
CHLORIDE: 99 mmol/L (ref 98–110)
CO2: 30 mmol/L (ref 20–31)
Creat: 0.89 mg/dL (ref 0.50–0.99)
Glucose, Bld: 179 mg/dL — ABNORMAL HIGH (ref 70–99)
POTASSIUM: 4.4 mmol/L (ref 3.5–5.3)
Sodium: 140 mmol/L (ref 135–146)
Total Bilirubin: 0.6 mg/dL (ref 0.2–1.2)
Total Protein: 6.8 g/dL (ref 6.1–8.1)

## 2016-11-24 ENCOUNTER — Telehealth: Payer: Self-pay | Admitting: Family Medicine

## 2016-11-24 NOTE — Telephone Encounter (Signed)
Pt has questions regarding b12 supplement. Please call her back [phone]

## 2016-11-24 NOTE — Telephone Encounter (Signed)
Returned call to patient and discussed OTC Vit B 12.

## 2016-11-28 ENCOUNTER — Other Ambulatory Visit: Payer: Self-pay | Admitting: Family Medicine

## 2017-01-09 ENCOUNTER — Ambulatory Visit (INDEPENDENT_AMBULATORY_CARE_PROVIDER_SITE_OTHER): Payer: Medicare HMO | Admitting: Family Medicine

## 2017-01-09 ENCOUNTER — Encounter: Payer: Self-pay | Admitting: Family Medicine

## 2017-01-09 VITALS — BP 126/80 | HR 82 | Temp 98.1°F | Resp 16 | Ht 62.0 in | Wt 256.0 lb

## 2017-01-09 DIAGNOSIS — J329 Chronic sinusitis, unspecified: Secondary | ICD-10-CM | POA: Diagnosis not present

## 2017-01-09 DIAGNOSIS — J310 Chronic rhinitis: Secondary | ICD-10-CM

## 2017-01-09 DIAGNOSIS — F411 Generalized anxiety disorder: Secondary | ICD-10-CM | POA: Diagnosis not present

## 2017-01-09 DIAGNOSIS — F439 Reaction to severe stress, unspecified: Secondary | ICD-10-CM

## 2017-01-09 DIAGNOSIS — E669 Obesity, unspecified: Secondary | ICD-10-CM

## 2017-01-09 DIAGNOSIS — R5383 Other fatigue: Secondary | ICD-10-CM

## 2017-01-09 DIAGNOSIS — I10 Essential (primary) hypertension: Secondary | ICD-10-CM | POA: Diagnosis not present

## 2017-01-09 DIAGNOSIS — G894 Chronic pain syndrome: Secondary | ICD-10-CM | POA: Diagnosis not present

## 2017-01-09 DIAGNOSIS — E1169 Type 2 diabetes mellitus with other specified complication: Secondary | ICD-10-CM

## 2017-01-09 DIAGNOSIS — E7800 Pure hypercholesterolemia, unspecified: Secondary | ICD-10-CM

## 2017-01-09 LAB — COMPREHENSIVE METABOLIC PANEL
ALBUMIN: 3.9 g/dL (ref 3.6–5.1)
ALT: 12 U/L (ref 6–29)
AST: 19 U/L (ref 10–35)
Alkaline Phosphatase: 123 U/L (ref 33–130)
BUN: 16 mg/dL (ref 7–25)
CALCIUM: 9.7 mg/dL (ref 8.6–10.4)
CHLORIDE: 100 mmol/L (ref 98–110)
CO2: 28 mmol/L (ref 20–31)
Creat: 0.99 mg/dL (ref 0.50–0.99)
GLUCOSE: 113 mg/dL — AB (ref 70–99)
POTASSIUM: 4.7 mmol/L (ref 3.5–5.3)
SODIUM: 137 mmol/L (ref 135–146)
TOTAL PROTEIN: 7.3 g/dL (ref 6.1–8.1)
Total Bilirubin: 0.6 mg/dL (ref 0.2–1.2)

## 2017-01-09 LAB — LIPID PANEL
CHOLESTEROL: 124 mg/dL (ref <200)
HDL: 43 mg/dL — ABNORMAL LOW (ref >50)
LDL Cholesterol: 63 mg/dL (ref <100)
TRIGLYCERIDES: 90 mg/dL (ref <150)
Total CHOL/HDL Ratio: 2.9 Ratio (ref <5.0)
VLDL: 18 mg/dL (ref <30)

## 2017-01-09 LAB — CBC WITH DIFFERENTIAL/PLATELET
Basophils Absolute: 0 cells/uL (ref 0–200)
Basophils Relative: 0 %
EOS PCT: 6 %
Eosinophils Absolute: 420 cells/uL (ref 15–500)
HEMATOCRIT: 36.6 % (ref 35.0–45.0)
HEMOGLOBIN: 11.7 g/dL — AB (ref 12.0–15.0)
LYMPHS ABS: 1680 {cells}/uL (ref 850–3900)
Lymphocytes Relative: 24 %
MCH: 28 pg (ref 27.0–33.0)
MCHC: 32 g/dL (ref 32.0–36.0)
MCV: 87.6 fL (ref 80.0–100.0)
MONO ABS: 420 {cells}/uL (ref 200–950)
MPV: 9.1 fL (ref 7.5–12.5)
Monocytes Relative: 6 %
NEUTROS ABS: 4480 {cells}/uL (ref 1500–7800)
Neutrophils Relative %: 64 %
Platelets: 267 10*3/uL (ref 140–400)
RBC: 4.18 MIL/uL (ref 3.80–5.10)
RDW: 15 % (ref 11.0–15.0)
WBC: 7 10*3/uL (ref 3.8–10.8)

## 2017-01-09 LAB — TSH: TSH: 1.34 mIU/L

## 2017-01-09 MED ORDER — HYDROCODONE-ACETAMINOPHEN 5-325 MG PO TABS: 1.0000 | ORAL_TABLET | Freq: Four times a day (QID) | ORAL | 0 refills | Status: DC | PRN

## 2017-01-09 MED ORDER — VITAMIN B-12 1000 MCG PO TABS: 1000.0000 ug | ORAL_TABLET | Freq: Every day | ORAL | Status: AC

## 2017-01-09 MED ORDER — COENZYME Q10 30 MG PO CAPS: 30.0000 mg | ORAL_CAPSULE | Freq: Three times a day (TID) | ORAL | Status: AC

## 2017-01-09 NOTE — Patient Instructions (Signed)
Claritin or zyrtec in the morning  Take with the flonase  We will call with lab results  F/U 4 months

## 2017-01-09 NOTE — Assessment & Plan Note (Signed)
Continue alprazolam as prescribed

## 2017-01-09 NOTE — Assessment & Plan Note (Signed)
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 This a difficult situation discussed with her having a meeting with the family see how they can best take care of her mother together

## 2017-01-09 NOTE — Assessment & Plan Note (Signed)
Her diabetes has been diet controlled however she is still gaining weight. We discussed dietary changes that need to be made. I think her chronic fatigue is associated with stress at home which then affecting her eating habits or weight gain then affects her chronic pain and cycles back to her fatigue. We discussed getting out walking or going to the senior center

## 2017-01-09 NOTE — Assessment & Plan Note (Signed)
OA multiple joints Refilled Norco

## 2017-01-09 NOTE — Assessment & Plan Note (Addendum)
Blood pressure controlled no change to medication 

## 2017-01-09 NOTE — Progress Notes (Signed)
Subjective:    Patient ID: [name] [name], female    DOB: August 30, 1956, 61 y.o.   MRN: [id number]  Patient presents for 4 month F/U (is fasting)  She had a follow-up chronic medical problems. She continues to have congestion and fatigue. She's been treated for influenza as well as sinusitis twice during the winter. She feels like she hasn't bounced back. She then also admits that she has stress at home taking care of her mother and trying to keep everything straight. She still using Flonase which helps with the congestion in her nose. Next  Diabetes mellitus last A1c was 6.1% she has gained 5 pounds in the past 2 months. She admits to not eating is helping anymore car she is also not active states that she hasn't felt good since she has been sick.  She has chronic pain from osteoarthritis she does take Naprosyn which helps she also requests a refill on her hydrocodone which she last received in October  Medications reviewed in detail  Note she is also taking vitamin B-12 and coenzyme Q10 and vitamin C which she thinks has helped with her fatigue Review Of Systems:  GEN- + fatigue, fever, weight loss,weakness, recent illness HEENT- denies eye drainage, change in vision,+ nasal discharge, CVS- denies chest pain, palpitations RESP- denies SOB, cough, wheeze ABD- denies N/V, change in stools, abd pain GU- denies dysuria, hematuria, dribbling, incontinence MSK- denies joint pain, muscle aches, injury Neuro- denies headache, dizziness, syncope, seizure activity       Objective:    BP 126/80   Pulse 82   Temp 98.1 F (36.7 C) (Oral)   Resp 16   Ht 5\' 2"  (1.575 m)   Wt 256 lb (116.1 kg)   LMP 06/03/2012   SpO2 99%   BMI 46.82 kg/m  GEN- NAD, alert and oriented x3 HEENT- PERRL, EOMI, non injected sclera, pink conjunctiva, MMM, oropharynx clear, nares clear rhinorrhea, no maxillary sinus tenderness Neck- Supple, no thyromegaly CVS- RRR, no  murmur RESP-CTAB ABD-NABS,soft,NT,ND EXT- No edema Pulses- Radial 2+        Assessment & Plan:      Problem List Items Addressed This Visit    Stress at home    Significant stresses at home 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 This a difficult situation discussed with her having a meeting with the family see how they can best take care of her mother together      Obesity (BMI 30-39.9) - Primary   Hyperlipidemia   Relevant Orders   Lipid panel   HTN, goal below 130/80    Blood pressure controlled no change to medication.      Diabetes mellitus type 2 in obese [facility])    Her diabetes has been diet controlled however she is still gaining weight. We discussed dietary changes that need to be made. I think her chronic fatigue is associated with stress at home which then affecting her eating habits or weight gain then affects her chronic pain and cycles back to her fatigue. We discussed getting out walking or going to the senior center      Relevant Orders   CBC with Differential/Platelet   Comprehensive metabolic panel   Hemoglobin A1c   Chronic pain    OA multiple joints Refilled Norco      Relevant Medications   HYDROcodone-acetaminophen (NORCO) 5-325 MG tablet   Anxiety state    Continue alprazolam as prescribed  Other Visit Diagnoses    Other fatigue       Relevant Orders   TSH   Rhinosinusitis       No sign of bacterial infection, but now with congestion with change in weather, recommend flonase and oral anti-histamine, if not clearing since she has had 2 infections will get CT max/face      Note: This dictation was prepared with Dragon dictation along with smaller phrase technology. Any transcriptional errors that result from this process are unintentional.

## 2017-01-10 LAB — HEMOGLOBIN A1C
Hgb A1c MFr Bld: 6.7 % — ABNORMAL HIGH (ref <5.7)
Mean Plasma Glucose: 146 mg/dL

## 2017-03-22 ENCOUNTER — Other Ambulatory Visit: Payer: Self-pay | Admitting: Family Medicine

## 2017-03-23 ENCOUNTER — Other Ambulatory Visit: Payer: Self-pay | Admitting: Family Medicine

## 2017-03-23 NOTE — Telephone Encounter (Signed)
okay

## 2017-03-23 NOTE — Telephone Encounter (Signed)
Ok to refill??  Last office visit 01/09/2017.  Last refill 09/11/2017, #2 refills.

## 2017-03-28 DIAGNOSIS — Z124 Encounter for screening for malignant neoplasm of cervix: Secondary | ICD-10-CM | POA: Diagnosis not present

## 2017-04-17 ENCOUNTER — Telehealth: Payer: Self-pay | Admitting: *Deleted

## 2017-04-17 MED ORDER — ADJUSTABLE LANCING DEVICE MISC: 0 refills | Status: DC

## 2017-04-17 MED ORDER — ACCU-CHEK FASTCLIX LANCETS MISC: 3 refills | Status: DC

## 2017-04-17 MED ORDER — BD SWAB SINGLE USE REGULAR PADS: MEDICATED_PAD | 3 refills | Status: DC

## 2017-04-17 MED ORDER — BLOOD GLUCOSE MONITORING SUPPL W/DEVICE KIT: PACK | 3 refills | Status: DC

## 2017-04-17 MED ORDER — GLUCOSE BLOOD VI STRP: ORAL_STRIP | 3 refills | Status: DC

## 2017-04-17 NOTE — Telephone Encounter (Signed)
Received fax requesting refill on DM supplies.   Prescription sent to pharmacy.

## 2017-04-24 ENCOUNTER — Other Ambulatory Visit: Payer: Self-pay | Admitting: *Deleted

## 2017-04-24 MED ORDER — ACCU-CHEK SMARTVIEW CONTROL VI LIQD: 3 refills | Status: DC

## 2017-05-11 ENCOUNTER — Ambulatory Visit (INDEPENDENT_AMBULATORY_CARE_PROVIDER_SITE_OTHER): Payer: Medicare HMO | Admitting: Family Medicine

## 2017-05-11 ENCOUNTER — Encounter: Payer: Self-pay | Admitting: Family Medicine

## 2017-05-11 VITALS — BP 124/78 | HR 88 | Temp 97.9°F | Resp 14 | Ht 62.0 in | Wt 245.0 lb

## 2017-05-11 DIAGNOSIS — I10 Essential (primary) hypertension: Secondary | ICD-10-CM | POA: Diagnosis not present

## 2017-05-11 DIAGNOSIS — E1169 Type 2 diabetes mellitus with other specified complication: Secondary | ICD-10-CM | POA: Diagnosis not present

## 2017-05-11 DIAGNOSIS — G894 Chronic pain syndrome: Secondary | ICD-10-CM

## 2017-05-11 DIAGNOSIS — E669 Obesity, unspecified: Secondary | ICD-10-CM

## 2017-05-11 LAB — CBC WITH DIFFERENTIAL/PLATELET
Basophils Absolute: 70 cells/uL (ref 0–200)
Basophils Relative: 1 %
Eosinophils Absolute: 280 cells/uL (ref 15–500)
Eosinophils Relative: 4 %
HEMATOCRIT: 39.6 % (ref 35.0–45.0)
HEMOGLOBIN: 13 g/dL (ref 12.0–15.0)
LYMPHS ABS: 1540 {cells}/uL (ref 850–3900)
LYMPHS PCT: 22 %
MCH: 28.8 pg (ref 27.0–33.0)
MCHC: 32.8 g/dL (ref 32.0–36.0)
MCV: 87.6 fL (ref 80.0–100.0)
MPV: 9.2 fL (ref 7.5–12.5)
Monocytes Absolute: 490 cells/uL (ref 200–950)
Monocytes Relative: 7 %
NEUTROS PCT: 66 %
Neutro Abs: 4620 cells/uL (ref 1500–7800)
Platelets: 283 10*3/uL (ref 140–400)
RBC: 4.52 MIL/uL (ref 3.80–5.10)
RDW: 14.3 % (ref 11.0–15.0)
WBC: 7 10*3/uL (ref 3.8–10.8)

## 2017-05-11 MED ORDER — HYDROCODONE-ACETAMINOPHEN 5-325 MG PO TABS
1.0000 | ORAL_TABLET | Freq: Four times a day (QID) | ORAL | 0 refills | Status: DC | PRN
Start: 2017-05-11 — End: 2017-09-12

## 2017-05-11 NOTE — Progress Notes (Signed)
   Subjective:    Patient ID: [name] [name], female    DOB: 11/18/1955, 61 y.o.   MRN: [id number]  Patient presents for 4 month F/U (is fasting)   Pt here to f/u chronic medical problems  Weight down 11lbs with weight changes , cut back on carbs/breads,eating more veggies and watching portions   DM- last A1C 6.7%, no current meds, Lipids at goal in March LDL 63, not able to go to [facility] doctor right now,   Has follow up with ENT for her hoarse voice    HTN- taking BP meds as prescribed  Review Of Systems:  GEN- denies fatigue, fever, weight loss,weakness, recent illness HEENT- denies eye drainage, change in vision, nasal discharge, CVS- denies chest pain, palpitations RESP- denies SOB, cough, wheeze ABD- denies N/V, change in stools, abd pain GU- denies dysuria, hematuria, dribbling, incontinence MSK- denies joint pain, muscle aches, injury Neuro- denies headache, dizziness, syncope, seizure activity       Objective:    BP 124/78   Pulse 88   Temp 97.9 F (36.6 C) (Oral)   Resp 14   Ht 5\' 2"  (1.575 m)   Wt 245 lb (111.1 kg)   LMP 06/03/2012   SpO2 98%   BMI 44.81 kg/m  GEN- NAD, alert and oriented x3 HEENT- PERRL, EOMI, non injected sclera, pink conjunctiva, MMM, oropharynx clear Neck- Supple, no thyromegaly CVS- RRR, no murmur RESP-CTAB EXT- No edema Pulses- Radial, DP- 2+        Assessment & Plan:      Problem List Items Addressed This Visit    Obesity (BMI 30-39.9)    Continues to work on weight loss, healthy eating Would like to get her below 200lbs      HTN, goal below 130/80    Controlled no changes       Diabetes mellitus type 2 in obese (HCC) - Primary    Recheck A1C, no current meds She is very sensitive to diabetic meds, often has hypoglycemia episodes       Relevant Orders   CBC with Differential/Platelet (Completed)   Comprehensive metabolic panel (Completed)   Hemoglobin A1c (Completed)   Chronic pain    Pain medication  refilled No sign of overuse      Relevant Medications   HYDROcodone-acetaminophen (NORCO) 5-325 MG tablet      Note: This dictation was prepared with Dragon dictation along with smaller phrase technology. Any transcriptional errors that result from this process are unintentional.

## 2017-05-11 NOTE — Patient Instructions (Addendum)
F/U 4 months Physical  

## 2017-05-12 LAB — COMPREHENSIVE METABOLIC PANEL
ALBUMIN: 4.3 g/dL (ref 3.6–5.1)
ALT: 16 U/L (ref 6–29)
AST: 24 U/L (ref 10–35)
Alkaline Phosphatase: 155 U/L — ABNORMAL HIGH (ref 33–130)
BILIRUBIN TOTAL: 0.7 mg/dL (ref 0.2–1.2)
BUN: 19 mg/dL (ref 7–25)
CALCIUM: 9.7 mg/dL (ref 8.6–10.4)
CO2: 24 mmol/L (ref 20–31)
Chloride: 96 mmol/L — ABNORMAL LOW (ref 98–110)
Creat: 1.23 mg/dL — ABNORMAL HIGH (ref 0.50–0.99)
GLUCOSE: 110 mg/dL — AB (ref 70–99)
POTASSIUM: 4.8 mmol/L (ref 3.5–5.3)
Sodium: 137 mmol/L (ref 135–146)
Total Protein: 8 g/dL (ref 6.1–8.1)

## 2017-05-12 LAB — HEMOGLOBIN A1C
Hgb A1c MFr Bld: 6.5 % — ABNORMAL HIGH (ref <5.7)
MEAN PLASMA GLUCOSE: 140 mg/dL

## 2017-05-13 NOTE — Assessment & Plan Note (Signed)
Recheck A1C, no current meds She is very sensitive to diabetic meds, often has hypoglycemia episodes

## 2017-05-13 NOTE — Assessment & Plan Note (Signed)
Controlled no changes 

## 2017-05-13 NOTE — Assessment & Plan Note (Signed)
Pain medication refilled No sign of overuse

## 2017-05-13 NOTE — Assessment & Plan Note (Addendum)
Continues to work on weight loss, healthy eating Would like to get her below 200lbs

## 2017-05-13 NOTE — Assessment & Plan Note (Deleted)
Continues to work on weight loss, healthy eating

## 2017-05-21 ENCOUNTER — Ambulatory Visit (INDEPENDENT_AMBULATORY_CARE_PROVIDER_SITE_OTHER): Payer: Medicare HMO | Admitting: Otolaryngology

## 2017-05-21 DIAGNOSIS — K219 Gastro-esophageal reflux disease without esophagitis: Secondary | ICD-10-CM | POA: Diagnosis not present

## 2017-05-21 DIAGNOSIS — R490 Dysphonia: Secondary | ICD-10-CM

## 2017-07-03 ENCOUNTER — Other Ambulatory Visit: Payer: Self-pay | Admitting: Family Medicine

## 2017-07-04 MED ORDER — ALPRAZOLAM 1 MG PO TABS: 1.0000 mg | ORAL_TABLET | Freq: Two times a day (BID) | ORAL | 2 refills | Status: DC | PRN

## 2017-07-04 NOTE — Telephone Encounter (Signed)
Medication called to pharmacy. 

## 2017-07-04 NOTE — Telephone Encounter (Signed)
Okay to refill? 

## 2017-07-04 NOTE — Telephone Encounter (Signed)
Ok to refill Xanax??  Last office visit 05/11/2017.  Last refill 03/23/2017, #2 refills.

## 2017-07-10 ENCOUNTER — Other Ambulatory Visit: Payer: Self-pay | Admitting: Family Medicine

## 2017-07-10 DIAGNOSIS — Z1231 Encounter for screening mammogram for malignant neoplasm of breast: Secondary | ICD-10-CM

## 2017-07-19 ENCOUNTER — Ambulatory Visit (HOSPITAL_COMMUNITY)
Admission: RE | Admit: 2017-07-19 | Discharge: 2017-07-19 | Disposition: A | Discharge status: Home / Self Care | Payer: Medicare HMO | Source: Ambulatory Visit | Attending: Family Medicine | Admitting: Family Medicine

## 2017-07-19 DIAGNOSIS — Z1231 Encounter for screening mammogram for malignant neoplasm of breast: Secondary | ICD-10-CM | POA: Diagnosis not present

## 2017-08-09 ENCOUNTER — Ambulatory Visit: Payer: Medicare HMO

## 2017-08-09 ENCOUNTER — Ambulatory Visit (INDEPENDENT_AMBULATORY_CARE_PROVIDER_SITE_OTHER): Payer: Medicare HMO | Admitting: Physician Assistant

## 2017-08-09 ENCOUNTER — Encounter: Payer: Self-pay | Admitting: Physician Assistant

## 2017-08-09 VITALS — BP 118/74 | HR 60 | Temp 97.8°F | Resp 16 | Ht 62.0 in | Wt 245.6 lb

## 2017-08-09 DIAGNOSIS — R300 Dysuria: Secondary | ICD-10-CM | POA: Diagnosis not present

## 2017-08-09 DIAGNOSIS — N390 Urinary tract infection, site not specified: Secondary | ICD-10-CM | POA: Diagnosis not present

## 2017-08-09 LAB — URINALYSIS, ROUTINE W REFLEX MICROSCOPIC
BILIRUBIN URINE: NEGATIVE
GLUCOSE, UA: NEGATIVE
Ketones, ur: NEGATIVE
NITRITE: POSITIVE — AB
PROTEIN: NEGATIVE
Specific Gravity, Urine: 1.025 (ref 1.001–1.03)
pH: 5.5 (ref 5.0–8.0)

## 2017-08-09 LAB — MICROSCOPIC MESSAGE

## 2017-08-09 MED ORDER — CIPROFLOXACIN HCL 500 MG PO TABS: 500.0000 mg | ORAL_TABLET | Freq: Two times a day (BID) | ORAL | 0 refills | Status: DC

## 2017-08-09 NOTE — Progress Notes (Signed)
Patient ID: KASHINA MECUM MRN: [id number], DOB: 1956/02/25, 61 y.o. Date of Encounter: 08/09/2017, 10:34 AM    Chief Complaint:  Chief Complaint  Patient presents with  . Flu Vaccine  . Dysuria    x1week  . odor with urine     HPI: 61 y.o. year old female presents with above.   She states that she had recently noticed some stinging and burning when she would urinate. It is that she noticed a little bit of an odor as well. Has had some mild urgency and frequency. No pain in her back and no fevers or chills.     Home Meds:   Outpatient Medications Prior to Visit  Medication Sig Dispense Refill  . ACCU-CHEK FASTCLIX LANCETS MISC TEST FASTING BLOOD SUGAR TWICE PER DAY DX; E11.9 102 each 3  . Alcohol Swabs (B-D SINGLE USE SWABS REGULAR) PADS TEST FASTING BLOOD SUGAR TWICE PER DAY DX; E11.9 100 each 3  . ALPRAZolam (XANAX) 1 MG tablet Take 1 tablet (1 mg total) by mouth 2 (two) times daily as needed. 45 tablet 2  . amLODipine (NORVASC) 10 MG tablet TAKE 1 TABLET EVERY MORNING 90 tablet 1  . aspirin 325 MG tablet Take 325 mg by mouth every morning.     . Blood Glucose Calibration (ACCU-CHEK SMARTVIEW CONTROL) LIQD Use as directed. Monitor FSBS 2x daily. Dx; E11.9. 1 each 3  . Blood Glucose Monitoring Suppl w/Device KIT TEST FASTING BLOOD SUGAR TWICE PER DAY DX; E11.9 1 each 3  . Cholecalciferol (VITAMIN D-3) 1000 UNITS CAPS Take by mouth.    . co-enzyme Q-10 30 MG capsule Take 1 capsule (30 mg total) by mouth 3 (three) times daily.    . fluticasone (FLONASE) 50 MCG/ACT nasal spray USE 2 SPRAYS IN EACH NOSTRIL EVERY DAY 48 g 3  . furosemide (LASIX) 20 MG tablet Take 1 tablet (20 mg total) by mouth daily as needed. 90 tablet 3  . glucose blood (ACCU-CHEK SMARTVIEW) test strip TEST FASTING BLOOD SUGAR TWICE PER DAY DX; E11.9 200 each 3  . HYDROcodone-acetaminophen (NORCO) 5-325 MG tablet Take 1 tablet by mouth every 6 (six) hours as needed for moderate pain. 30 tablet 0  . Lancet  Devices (ADJUSTABLE LANCING DEVICE) MISC TEST FASTING BLOOD SUGAR TWICE PER DAY DX; E11.9 1 each 0  . losartan (COZAAR) 50 MG tablet TAKE 1 TABLET EVERY DAY 90 tablet 1  . Multiple Vitamins-Minerals (CENTRUM ADULTS PO) Take by mouth.    . naproxen (NAPROSYN) 500 MG tablet TAKE 1 TABLET TWICE DAILY WITH MEALS 180 tablet 1  . omeprazole (PRILOSEC) 20 MG capsule TAKE 1 CAPSULE EVERY DAY 90 capsule 1  . polyethylene glycol powder (GLYCOLAX/MIRALAX) powder MIX 1 CAPFUL (17GM) IN 8 OUNCES OF WATER AND DRINK EVERY DAY 1581 g 1  . rosuvastatin (CRESTOR) 10 MG tablet TAKE 1 TABLET AT BEDTIME 90 tablet 1  . spironolactone (ALDACTONE) 25 MG tablet TAKE 1 TABLET EVERY MORNING 90 tablet 1  . triamcinolone cream (KENALOG) 0.1 % APPLY TOPICALLY TO VULVA 2 TIMES DAILY 45 g 3  . vitamin B-12 (CYANOCOBALAMIN) 1000 MCG tablet Take 1 tablet (1,000 mcg total) by mouth daily.     No facility-administered medications prior to visit.     Allergies:  Allergies  Allergen Reactions  . Contrast Media [Iodinated Diagnostic Agents] Hives  . Lisinopril     REACTION: cough  . Buspar [Buspirone Hcl] Palpitations      Review of Systems: See HPI for  pertinent ROS. All other ROS negative.    Physical Exam: Blood pressure 118/74, pulse 60, temperature 97.8 F (36.6 C), temperature source Oral, resp. rate 16, height 5' 2" (1.575 m), weight 111.4 kg (245 lb 9.6 oz), last menstrual period 06/03/2012, SpO2 98 %., Body mass index is 44.92 kg/m. General:  Obese AAF. Appears in no acute distress. Neck: Supple. No thyromegaly. No lymphadenopathy. Lungs: Clear bilaterally to auscultation without wheezes, rales, or rhonchi. Breathing is unlabored. Heart: Regular rhythm. No murmurs, rubs, or gallops. Abdomen: Soft, non-tender, non-distended with normoactive bowel sounds. No hepatomegaly. No rebound/guarding. No obvious abdominal masses. Msk:  Strength and tone normal for age. No tenderness with percussion of costophrenic angles  bilaterally. Extremities/Skin: Warm and dry.  Neuro: Alert and oriented X 3. Moves all extremities spontaneously. Gait is normal. CNII-XII grossly in tact. Psych:  Responds to questions appropriately with a normal affect.   Results for orders placed or performed in visit on 08/09/17  Urinalysis, Routine w reflex microscopic  Result Value Ref Range   Color, Urine YELLOW YELLOW   APPearance CLOUDY (A) CLEAR   Specific Gravity, Urine 1.025 1.001 - 1.03   pH 5.5 5.0 - 8.0   Glucose, UA NEGATIVE NEGATIVE   Bilirubin Urine NEGATIVE NEGATIVE   Ketones, ur NEGATIVE NEGATIVE   Hgb urine dipstick TRACE (A) NEGATIVE   Protein, ur NEGATIVE NEGATIVE   Nitrite POSITIVE (A) NEGATIVE   Leukocytes, UA 2+ (A) NEGATIVE   WBC, UA 10-20 (A) 0 - 5 /HPF   RBC / HPF 0-2 0 - 2 /HPF   Squamous Epithelial / LPF 0-5 < OR = 5 /HPF   Bacteria, UA MANY (A) NONE SEEN /HPF  Microscopic Message  Result Value Ref Range   Note       ASSESSMENT AND PLAN:  61 y.o. year old female with  1. Urinary tract infection without hematuria, site unspecified She is to start antibiotic immediately and take as directed and complete all of it. Follow-up if symptoms worsen/develops fever or if symptoms do not resolve after completion of antibiotic. - ciprofloxacin (CIPRO) 500 MG tablet; Take 1 tablet (500 mg total) by mouth 2 (two) times daily.  Dispense: 14 tablet; Refill: 0  2. Dysuria - Urinalysis, Routine w reflex microscopic  She wanted to get a flu vaccine today. I recommend that she wait one week to get the flu vaccine after she has completed antibiotics and this infection is resolved. He is agreeable and is scheduling to return for flu vaccine here.  Signed, [address] [place], [place], [facility] 08/09/2017 10:34 AM

## 2017-08-14 ENCOUNTER — Ambulatory Visit: Payer: Medicare HMO

## 2017-08-14 ENCOUNTER — Ambulatory Visit (INDEPENDENT_AMBULATORY_CARE_PROVIDER_SITE_OTHER): Payer: Medicare HMO

## 2017-08-14 DIAGNOSIS — Z23 Encounter for immunization: Secondary | ICD-10-CM | POA: Diagnosis not present

## 2017-08-14 NOTE — Progress Notes (Signed)
Patient was seen in office for flu vaccine.patietn received vaccine in right deltoid.Patient tolerated well

## 2017-08-14 NOTE — Progress Notes (Unsigned)
Patient was seen in office for flu vaccine. patient received vaccine in right deltoid patient tolerated well 

## 2017-08-16 ENCOUNTER — Ambulatory Visit: Payer: Medicare HMO

## 2017-08-23 ENCOUNTER — Other Ambulatory Visit: Payer: Self-pay | Admitting: Family Medicine

## 2017-08-24 DIAGNOSIS — H52229 Regular astigmatism, unspecified eye: Secondary | ICD-10-CM | POA: Diagnosis not present

## 2017-08-24 LAB — HM DIABETES EYE EXAM

## 2017-09-12 ENCOUNTER — Encounter: Payer: Self-pay | Admitting: Family Medicine

## 2017-09-12 ENCOUNTER — Ambulatory Visit (INDEPENDENT_AMBULATORY_CARE_PROVIDER_SITE_OTHER): Payer: Medicare HMO | Admitting: Family Medicine

## 2017-09-12 ENCOUNTER — Other Ambulatory Visit: Payer: Self-pay

## 2017-09-12 VITALS — BP 132/80 | HR 82 | Temp 97.9°F | Resp 16 | Ht 62.0 in | Wt 245.0 lb

## 2017-09-12 DIAGNOSIS — E1169 Type 2 diabetes mellitus with other specified complication: Secondary | ICD-10-CM

## 2017-09-12 DIAGNOSIS — E669 Obesity, unspecified: Secondary | ICD-10-CM

## 2017-09-12 DIAGNOSIS — F411 Generalized anxiety disorder: Secondary | ICD-10-CM

## 2017-09-12 DIAGNOSIS — E7800 Pure hypercholesterolemia, unspecified: Secondary | ICD-10-CM | POA: Diagnosis not present

## 2017-09-12 DIAGNOSIS — Z1159 Encounter for screening for other viral diseases: Secondary | ICD-10-CM | POA: Diagnosis not present

## 2017-09-12 DIAGNOSIS — Z0000 Encounter for general adult medical examination without abnormal findings: Secondary | ICD-10-CM | POA: Diagnosis not present

## 2017-09-12 MED ORDER — HYDROCODONE-ACETAMINOPHEN 5-325 MG PO TABS: 1.0000 | ORAL_TABLET | Freq: Four times a day (QID) | ORAL | 0 refills | Status: DC | PRN

## 2017-09-12 NOTE — Patient Instructions (Addendum)
Get the Miralax over the counter, get the generic  Shingles vaccine to be ordered Release of records- [facility] [place]  F/U 6 months

## 2017-09-12 NOTE — Progress Notes (Signed)
Subjective:   Patient presents for Medicare Annual/Subsequent preventive examination.   Pt here for wellness exam  Medications reviewed  DM- last A1C 6.5% in July, no current diabetic meds HTN- taking BP meds as prescribed- norvasc, losartan, lasix prn, aldactone Hyperlipidemia- taking Crestor    Review Past Medical/Family/Social: Per EMR   Risk Factors  Current exercise habits: not consistent Dietary issues discussed: YES  Cardiac risk factors: Obesity (BMI >= 30 kg/m2). DM, HTN  Depression Screen  (Note: if answer to either of the following is "Yes", a more complete depression screening is indicated)  Over the past two weeks, have you felt down, depressed or hopeless? No Over the past two weeks, have you felt little interest or pleasure in doing things? No Have you lost interest or pleasure in daily life? No Do you often feel hopeless? No Do you cry easily over simple problems? No   Activities of Daily Living  In your present state of health, do you have any difficulty performing the following activities?:  Driving? No  Managing money? No  Feeding yourself? No  Getting from bed to chair? No  Climbing a flight of stairs? No  Preparing food and eating?: No  Bathing or showering? No  Getting dressed: No  Getting to the toilet? No  Using the toilet:No  Moving around from place to place: No  In the past year have you fallen or had a near fall?:No  Are you sexually active? No  Do you have more than one partner? No   Hearing Difficulties: No  Do you often ask people to speak up or repeat themselves? No  Do you experience ringing or noises in your ears? No Do you have difficulty understanding soft or whispered voices? No  Do you feel that you have a problem with memory? No Do you often misplace items? No  Do you feel safe at home? Yes  Cognitive Testing  Alert? Yes Normal Appearance?Yes  Oriented to person? Yes Place? Yes  Time? Yes  Recall of three objects? Yes  Can  perform simple calculations? Yes  Displays appropriate judgment?Yes  Can read the correct time from a watch face?Yes   List the Names of Other Physician/Practitioners you currently use:   GYN  Ophthalmology- [facility]    Screening Tests / Date Colonoscopy       UTD              Zostavax DUE  Mammogram UTD Pneumonia- UTD had in  2011, due age 9  Influenza Vaccine UTD Tetanus/tdap UTD  ROS: GEN- denies fatigue, fever, weight loss,weakness, recent illness HEENT- denies eye drainage, change in vision, nasal discharge, CVS- denies chest pain, palpitations RESP- denies SOB, cough, wheeze ABD- denies N/V, change in stools, abd pain GU- denies dysuria, hematuria, dribbling, incontinence MSK- denies joint pain, muscle aches, injury Neuro- denies headache, dizziness, syncope, seizure activity  PHYSICAL: GEN- NAD, alert and oriented x3 HEENT- PERRL, EOMI, non injected sclera, pink conjunctiva, MMM, oropharynx clear, TM clear bilat, no effusion  Neck- Supple, no thryomegaly, no bruit CVS- RRR, no murmur RESP-CTAB ABD-NABS,soft,NT,ND PSYCH- normal affect and mood  EXT- No edema Pulses- Radial, DP- 2+    Assessment:    Annual wellness medicare exam   Plan:    During the course of the visit the patient was educated and counseled about appropriate screening and preventive services including:  Bone Density-would like to defer to 2019   DM- well controlled with diet, continue to work on  weight loss  - foot exam done, obtain eye exam HTN- controlled no changes  Hyperlipidemia- continue statin drug  Shingrix to be ordered for patient  Chronic anxiety - continue xanax, PHQ9 no sign depression  Hep C screening done  Discussed advanced directives, handout given  Fasting labs obtained      Diet review for nutrition referral? Yes ____ Not Indicated __x__  Patient Instructions (the written plan) was given to the patient.  Medicare Attestation  I have personally reviewed:   The patient's medical and social history  Their use of alcohol, tobacco or illicit drugs  Their current medications and supplements  The patient's functional ability including ADLs,fall risks, home safety risks, cognitive, and hearing and visual impairment  Diet and physical activities  Evidence for depression or mood disorders  The patient's weight, height, BMI, and visual acuity have been recorded in the chart. I have made referrals, counseling, and provided education to the patient based on review of the above and I have provided the patient with a written personalized care plan for preventive services.

## 2017-09-13 ENCOUNTER — Other Ambulatory Visit: Payer: Self-pay | Admitting: *Deleted

## 2017-09-13 ENCOUNTER — Encounter: Payer: Self-pay | Admitting: Family Medicine

## 2017-09-13 MED ORDER — GLUCOSE BLOOD VI STRP: ORAL_STRIP | 3 refills | Status: DC

## 2017-09-17 ENCOUNTER — Encounter: Payer: Self-pay | Admitting: Family Medicine

## 2017-09-17 DIAGNOSIS — E133299 Other specified diabetes mellitus with mild nonproliferative diabetic retinopathy without macular edema, unspecified eye: Secondary | ICD-10-CM | POA: Insufficient documentation

## 2017-09-17 LAB — COMPREHENSIVE METABOLIC PANEL
AG Ratio: 1.3 (calc) (ref 1.0–2.5)
ALT: 8 U/L (ref 6–29)
AST: 16 U/L (ref 10–35)
Albumin: 4 g/dL (ref 3.6–5.1)
Alkaline phosphatase (APISO): 129 U/L (ref 33–130)
BUN: 19 mg/dL (ref 7–25)
CHLORIDE: 100 mmol/L (ref 98–110)
CO2: 27 mmol/L (ref 20–32)
CREATININE: 0.97 mg/dL (ref 0.50–0.99)
Calcium: 9.4 mg/dL (ref 8.6–10.4)
GLOBULIN: 3.2 g/dL (ref 1.9–3.7)
GLUCOSE: 124 mg/dL — AB (ref 65–99)
Potassium: 4.6 mmol/L (ref 3.5–5.3)
Sodium: 138 mmol/L (ref 135–146)
Total Bilirubin: 0.5 mg/dL (ref 0.2–1.2)
Total Protein: 7.2 g/dL (ref 6.1–8.1)

## 2017-09-17 LAB — CBC WITH DIFFERENTIAL/PLATELET
BASOS ABS: 58 {cells}/uL (ref 0–200)
BASOS PCT: 0.8 %
EOS ABS: 292 {cells}/uL (ref 15–500)
Eosinophils Relative: 4 %
HCT: 38.7 % (ref 35.0–45.0)
Hemoglobin: 12.4 g/dL (ref 11.7–15.5)
Lymphs Abs: 1628 cells/uL (ref 850–3900)
MCH: 28.1 pg (ref 27.0–33.0)
MCHC: 32 g/dL (ref 32.0–36.0)
MCV: 87.8 fL (ref 80.0–100.0)
MONOS PCT: 6.3 %
MPV: 9.6 fL (ref 7.5–12.5)
NEUTROS PCT: 66.6 %
Neutro Abs: 4862 cells/uL (ref 1500–7800)
PLATELETS: 281 10*3/uL (ref 140–400)
RBC: 4.41 10*6/uL (ref 3.80–5.10)
RDW: 14.1 % (ref 11.0–15.0)
TOTAL LYMPHOCYTE: 22.3 %
WBC: 7.3 10*3/uL (ref 3.8–10.8)
WBCMIX: 460 {cells}/uL (ref 200–950)

## 2017-09-17 LAB — HIV ANTIBODY (ROUTINE TESTING W REFLEX): HIV: NONREACTIVE

## 2017-09-17 LAB — LIPID PANEL
CHOL/HDL RATIO: 2.7 (calc) (ref <5.0)
CHOLESTEROL: 120 mg/dL (ref <200)
HDL: 44 mg/dL — ABNORMAL LOW (ref >50)
LDL Cholesterol (Calc): 57 mg/dL (calc)
Non-HDL Cholesterol (Calc): 76 mg/dL (calc) (ref <130)
Triglycerides: 106 mg/dL (ref <150)

## 2017-09-17 LAB — HEPATITIS C ANTIBODY
Hepatitis C Ab: NONREACTIVE
SIGNAL TO CUT-OFF: 0.01 (ref <1.00)

## 2017-09-17 LAB — HEMOGLOBIN A1C
HEMOGLOBIN A1C: 6.4 %{Hb} — AB (ref <5.7)
Mean Plasma Glucose: 137 (calc)
eAG (mmol/L): 7.6 (calc)

## 2017-09-26 ENCOUNTER — Other Ambulatory Visit: Payer: Self-pay | Admitting: Family Medicine

## 2017-09-26 MED ORDER — HYDROCODONE-ACETAMINOPHEN 5-325 MG PO TABS: 1.0000 | ORAL_TABLET | Freq: Four times a day (QID) | ORAL | 0 refills | Status: DC | PRN

## 2017-09-26 NOTE — Telephone Encounter (Signed)
Pt here with daughter requested refill on pain meds for Dec

## 2017-10-03 ENCOUNTER — Encounter: Payer: Self-pay | Admitting: *Deleted

## 2017-10-22 ENCOUNTER — Other Ambulatory Visit: Payer: Self-pay | Admitting: Family Medicine

## 2017-10-24 ENCOUNTER — Encounter: Payer: Self-pay | Admitting: Family Medicine

## 2017-11-05 ENCOUNTER — Encounter: Payer: Self-pay | Admitting: Family Medicine

## 2017-11-05 ENCOUNTER — Ambulatory Visit (INDEPENDENT_AMBULATORY_CARE_PROVIDER_SITE_OTHER): Payer: Medicare HMO | Admitting: Family Medicine

## 2017-11-05 ENCOUNTER — Other Ambulatory Visit: Payer: Self-pay

## 2017-11-05 VITALS — BP 138/78 | HR 60 | Temp 97.8°F | Resp 16 | Ht 62.0 in | Wt 245.0 lb

## 2017-11-05 DIAGNOSIS — R42 Dizziness and giddiness: Secondary | ICD-10-CM | POA: Diagnosis not present

## 2017-11-05 DIAGNOSIS — B349 Viral infection, unspecified: Secondary | ICD-10-CM | POA: Diagnosis not present

## 2017-11-05 DIAGNOSIS — R21 Rash and other nonspecific skin eruption: Secondary | ICD-10-CM

## 2017-11-05 LAB — CBC WITH DIFFERENTIAL/PLATELET
BASOS PCT: 0.9 %
Basophils Absolute: 62 cells/uL (ref 0–200)
Eosinophils Absolute: 242 cells/uL (ref 15–500)
Eosinophils Relative: 3.5 %
HCT: 37.3 % (ref 35.0–45.0)
Hemoglobin: 12.2 g/dL (ref 11.7–15.5)
Lymphs Abs: 1870 cells/uL (ref 850–3900)
MCH: 28 pg (ref 27.0–33.0)
MCHC: 32.7 g/dL (ref 32.0–36.0)
MCV: 85.7 fL (ref 80.0–100.0)
MPV: 9.4 fL (ref 7.5–12.5)
Monocytes Relative: 8.2 %
Neutro Abs: 4161 cells/uL (ref 1500–7800)
Neutrophils Relative %: 60.3 %
PLATELETS: 265 10*3/uL (ref 140–400)
RBC: 4.35 10*6/uL (ref 3.80–5.10)
RDW: 12.8 % (ref 11.0–15.0)
TOTAL LYMPHOCYTE: 27.1 %
WBC: 6.9 10*3/uL (ref 3.8–10.8)
WBCMIX: 566 {cells}/uL (ref 200–950)

## 2017-11-05 LAB — COMPREHENSIVE METABOLIC PANEL
AG Ratio: 1.2 (calc) (ref 1.0–2.5)
ALBUMIN MSPROF: 4.1 g/dL (ref 3.6–5.1)
ALT: 9 U/L (ref 6–29)
AST: 13 U/L (ref 10–35)
Alkaline phosphatase (APISO): 147 U/L — ABNORMAL HIGH (ref 33–130)
BUN / CREAT RATIO: 18 (calc) (ref 6–22)
BUN: 19 mg/dL (ref 7–25)
CO2: 30 mmol/L (ref 20–32)
CREATININE: 1.06 mg/dL — AB (ref 0.50–0.99)
Calcium: 9.5 mg/dL (ref 8.6–10.4)
Chloride: 100 mmol/L (ref 98–110)
GLUCOSE: 119 mg/dL — AB (ref 65–99)
Globulin: 3.5 g/dL (calc) (ref 1.9–3.7)
POTASSIUM: 4.4 mmol/L (ref 3.5–5.3)
SODIUM: 138 mmol/L (ref 135–146)
Total Bilirubin: 0.6 mg/dL (ref 0.2–1.2)
Total Protein: 7.6 g/dL (ref 6.1–8.1)

## 2017-11-05 MED ORDER — MECLIZINE HCL 12.5 MG PO TABS: 12.5000 mg | ORAL_TABLET | Freq: Three times a day (TID) | ORAL | 0 refills | Status: AC | PRN

## 2017-11-05 MED ORDER — CLOTRIMAZOLE-BETAMETHASONE 1-0.05 % EX CREA: 1.0000 "application " | TOPICAL_CREAM | Freq: Two times a day (BID) | CUTANEOUS | 0 refills | Status: DC

## 2017-11-05 NOTE — Progress Notes (Signed)
   Subjective:    Patient ID: [name] [name], female    DOB: 1956/04/22, 62 y.o.   MRN: [id number]  Patient presents for Illness (x1 week- sore throat, fstigue, loss of balance)   Starting with vomiting and diarrhea, week ago, had for 24-48 hours, still feels fatigued, ear pressure, feels balance is off. Still has some sore throat.   No cough or congestion in chest  Blood glucose has been good 122 this AM  Has not eaten today, yesterday ate [place] sandwhich  Just doesn't feel good  + sick contacts   Has a rash on her thighs.  She is not sure how long this is been there.  A couple were blisters others were these itchy red spots.  No no rash or contact.  Only has it on her anterior thighs     Review Of Systems: per above   GEN- +fatigue, fever, weight loss,weakness, recent illness HEENT- denies eye drainage, change in vision, nasal discharge, CVS- denies chest pain, palpitations RESP- denies SOB, cough, wheeze ABD- denies N/V, change in stools, abd pain GU- denies dysuria, hematuria, dribbling, incontinence MSK- denies joint pain, muscle aches, injury Neuro- denies headache, +dizziness, syncope, seizure activity       Objective:    BP 138/78   Pulse 60   Temp 97.8 F (36.6 C) (Oral)   Resp 16   Ht 5\' 2"  (1.575 m)   Wt 245 lb (111.1 kg)   LMP 06/03/2012   SpO2 100%   BMI 44.81 kg/m  GEN- NAD, alert and oriented x3 HEENT- PERRL, EOMI, non injected sclera, pink conjunctiva, MMM, oropharynx clear, no maxillary sinus tenderness, nares clear rhinorrhea, TM clear bilat no effuion  Neck- Supple, no LAD  CVS- RRR, no murmur RESP-CTAB ABD-NABS,soft,NT,ND EXT- No edema Skin- blistering like lesion scattered on thigh, some scaley maculopapular lesions, NT  Pulses- Radial 2+        Assessment & Plan:      Problem List Items Addressed This Visit    None    Visit Diagnoses    Viral illness    -  Primary   Viral illness, slowly resolving some dizziness. Discused  hydration, check stat labs, vitals  stable , meclizine given.    Relevant Medications   clotrimazole-betamethasone (LOTRISONE) cream   Other Relevant Orders   CBC with Differential/Platelet   Comprehensive metabolic panel   Rash and nonspecific skin eruption       unclear cause of rash, since unknown when it appeared, no change in her personal hygiene routine, ? fungal or bacterial though no drinage and blistering ones are flat, though she did use neosporin Will try trazodone.  Her main complaint is that severe itching   Dizziness       Relevant Orders   CBC with Differential/Platelet   Comprehensive metabolic panel      Note: This dictation was prepared with Dragon dictation along with smaller phrase technology. Any transcriptional errors that result from this process are unintentional.

## 2017-11-05 NOTE — Patient Instructions (Addendum)
Use the cream twice a day  Use meclizine for dizziness Push fluid  F/U pending results  Viral Illness, Adult Viruses are tiny germs that can get into a person's body and cause illness. There are many different types of viruses, and they cause many types of illness. Viral illnesses can range from mild to severe. They can affect various parts of the body. Common illnesses that are caused by a virus include colds and the flu. Viral illnesses also include serious conditions such as HIV/AIDS (human immunodeficiency virus/acquired immunodeficiency syndrome). A few viruses have been linked to certain cancers. What are the causes? Many types of viruses can cause illness. Viruses invade cells in your body, multiply, and cause the infected cells to malfunction or die. When the cell dies, it releases more of the virus. When this happens, you develop symptoms of the illness, and the virus continues to spread to other cells. If the virus takes over the function of the cell, it can cause the cell to divide and grow out of control, as is the case when a virus causes cancer. Different viruses get into the body in different ways. You can get a virus by:  Swallowing food or water that is contaminated with the virus.  Breathing in droplets that have been coughed or sneezed into the air by an infected person.  Touching a surface that has been contaminated with the virus and then touching your eyes, nose, or mouth.  Being bitten by an insect or animal that carries the virus.  Having sexual contact with a person who is infected with the virus.  Being exposed to blood or fluids that contain the virus, either through an open cut or during a transfusion.  If a virus enters your body, your body's defense system (immune system) will try to fight the virus. You may be at higher risk for a viral illness if your immune system is weak. What are the signs or symptoms? Symptoms vary depending on the type of virus and the  location of the cells that it invades. Common symptoms of the main types of viral illnesses include: Cold and flu viruses  Fever.  Headache.  Sore throat.  Muscle aches.  Nasal congestion.  Cough. Digestive system (gastrointestinal) viruses  Fever.  Abdominal pain.  Nausea.  Diarrhea. Liver viruses (hepatitis)  Loss of appetite.  Tiredness.  Yellowing of the skin (jaundice). Brain and spinal cord viruses  Fever.  Headache.  Stiff neck.  Nausea and vomiting.  Confusion or sleepiness. Skin viruses  Warts.  Itching.  Rash. Sexually transmitted viruses  Discharge.  Swelling.  Redness.  Rash. How is this treated? Viruses can be difficult to treat because they live within cells. Antibiotic medicines do not treat viruses because these drugs do not get inside cells. Treatment for a viral illness may include:  Resting and drinking plenty of fluids.  Medicines to relieve symptoms. These can include over-the-counter medicine for pain and fever, medicines for cough or congestion, and medicines to relieve diarrhea.  Antiviral medicines. These drugs are available only for certain types of viruses. They may help reduce flu symptoms if taken early. There are also many antiviral medicines for hepatitis and HIV/AIDS.  Some viral illnesses can be prevented with vaccinations. A common example is the flu shot. Follow these instructions at home: Medicines   Take over-the-counter and prescription medicines only as told by your health care provider.  If you were prescribed an antiviral medicine, take it as told by your health  care provider. Do not stop taking the medicine even if you start to feel better.  Be aware of when antibiotics are needed and when they are not needed. Antibiotics do not treat viruses. If your health care provider thinks that you may have a bacterial infection as well as a viral infection, you may get an antibiotic. ? Do not ask for an  antibiotic prescription if you have been diagnosed with a viral illness. That will not make your illness go away faster. ? Frequently taking antibiotics when they are not needed can lead to antibiotic resistance. When this develops, the medicine no longer works against the bacteria that it normally fights. General instructions  Drink enough fluids to keep your urine clear or pale yellow.  Rest as much as possible.  Return to your normal activities as told by your health care provider. Ask your health care provider what activities are safe for you.  Keep all follow-up visits as told by your health care provider. This is important. How is this prevented? Take these actions to reduce your risk of viral infection:  Eat a healthy diet and get enough rest.  Wash your hands often with soap and water. This is especially important when you are in public places. If soap and water are not available, use hand sanitizer.  Avoid close contact with friends and family who have a viral illness.  If you travel to areas where viral gastrointestinal infection is common, avoid drinking water or eating raw food.  Keep your immunizations up to date. Get a flu shot every year as told by your health care provider.  Do not share toothbrushes, nail clippers, razors, or needles with other people.  Always practice safe sex.  Contact a health care provider if:  You have symptoms of a viral illness that do not go away.  Your symptoms come back after going away.  Your symptoms get worse. Get help right away if:  You have trouble breathing.  You have a severe headache or a stiff neck.  You have severe vomiting or abdominal pain. This information is not intended to replace advice given to you by your health care provider. Make sure you discuss any questions you have with your health care provider. Document Released: 02/11/2016 Document Revised: 03/15/2016 Document Reviewed: 02/11/2016 Elsevier Interactive  Patient Education  [employer].

## 2017-11-06 ENCOUNTER — Other Ambulatory Visit: Payer: Self-pay | Admitting: *Deleted

## 2017-11-06 ENCOUNTER — Encounter: Payer: Self-pay | Admitting: *Deleted

## 2017-11-06 MED ORDER — ALPRAZOLAM 1 MG PO TABS: 1.0000 mg | ORAL_TABLET | Freq: Two times a day (BID) | ORAL | 2 refills | Status: DC | PRN

## 2017-11-06 NOTE — Telephone Encounter (Signed)
Received call from patient.   Requested refill on Xanax.  Ok to refill??  Last office visit 11/05/2017.  Last refill 07/04/2017, #2 refills.

## 2017-11-08 ENCOUNTER — Telehealth: Payer: Self-pay | Admitting: *Deleted

## 2017-11-08 NOTE — Telephone Encounter (Signed)
Received call from patient.   Reports that she is not feeling any better. States that she continues to have episodes of fever and chills, continues to have dizziness, and feels awful.   States that she was advised to call back if no better.   MD please advise.

## 2017-11-09 NOTE — Telephone Encounter (Signed)
Call placed to patient. LMTRC.  

## 2017-11-09 NOTE — Telephone Encounter (Signed)
No appointments available for 11/09/2017.  MD aware and recommends ER evaluation.   Call placed to patient and patient made aware.

## 2017-11-09 NOTE — Telephone Encounter (Signed)
Needs to be rechecked, if she feels significantly worse with [place] fever  Go to ER See if we have a spot today

## 2017-11-19 ENCOUNTER — Ambulatory Visit (INDEPENDENT_AMBULATORY_CARE_PROVIDER_SITE_OTHER): Payer: Medicare HMO | Admitting: Otolaryngology

## 2017-11-19 DIAGNOSIS — R490 Dysphonia: Secondary | ICD-10-CM

## 2017-11-19 DIAGNOSIS — K219 Gastro-esophageal reflux disease without esophagitis: Secondary | ICD-10-CM | POA: Diagnosis not present

## 2017-12-17 ENCOUNTER — Other Ambulatory Visit: Payer: Self-pay

## 2017-12-17 ENCOUNTER — Ambulatory Visit (INDEPENDENT_AMBULATORY_CARE_PROVIDER_SITE_OTHER): Payer: Medicare HMO | Admitting: Family Medicine

## 2017-12-17 ENCOUNTER — Encounter: Payer: Self-pay | Admitting: Family Medicine

## 2017-12-17 VITALS — BP 130/78 | HR 70 | Temp 98.2°F | Resp 14 | Ht 62.0 in | Wt 251.0 lb

## 2017-12-17 DIAGNOSIS — I8390 Asymptomatic varicose veins of unspecified lower extremity: Secondary | ICD-10-CM | POA: Insufficient documentation

## 2017-12-17 DIAGNOSIS — R21 Rash and other nonspecific skin eruption: Secondary | ICD-10-CM | POA: Diagnosis not present

## 2017-12-17 DIAGNOSIS — L439 Lichen planus, unspecified: Secondary | ICD-10-CM | POA: Diagnosis not present

## 2017-12-17 DIAGNOSIS — I8393 Asymptomatic varicose veins of bilateral lower extremities: Secondary | ICD-10-CM | POA: Diagnosis not present

## 2017-12-17 MED ORDER — PREDNISONE 10 MG PO TABS: ORAL_TABLET | ORAL | 0 refills | Status: DC

## 2017-12-17 NOTE — Patient Instructions (Addendum)
F/U 1 week  Take the steroids

## 2017-12-17 NOTE — Progress Notes (Signed)
   Subjective:    Patient ID: [name] [name], female    DOB: [birth year], 62 y.o.   MRN: [id number]  Patient presents for Rash (small red bumps to skin on arms and legs) Here with rash that has been spreading over the past 6 weeks.  When she came in for a viral illness she had a few blistering lesions on her thigh and a few scabs.  She states that it started to spread over both lower extremities down to her ankles around her buttocks region she also had a few areas on her arms.  She feels a tingling sensation before they come up and then they itch especially when water hits them.  She has not had any drainage from any of the lesions.  She feels well now and not had any fever or chills but continues to have new spots.  I did give her Lotrisone but she had a small patch in her thigh but there was no improvement.  Also like to return to the vascular surgeon for her multiple varicose veins on her lower extremities which are worsening  Review Of Systems:  GEN- denies fatigue, fever, weight loss,weakness, recent illness HEENT- denies eye drainage, change in vision, nasal discharge, CVS- denies chest pain, palpitations RESP- denies SOB, cough, wheeze ABD- denies N/V, change in stools, abd pain GU- denies dysuria, hematuria, dribbling, incontinence MSK- denies joint pain, muscle aches, injury Neuro- denies headache, dizziness, syncope, seizure activity       Objective:    BP 130/78   Pulse 70   Temp 98.2 F (36.8 C) (Oral)   Resp 14   Ht 5\' 2"  (1.575 m)   Wt 251 lb (113.9 kg)   LMP 06/03/2012   SpO2 96%   BMI 45.91 kg/m  GEN- NAD, alert and oriented x3 HEENT- No oral lesions  Skin- collarette scaley lesions appearing lesions various sizes scattered on legs, buttocks, few on lower back 2-3 lesions on arms, hyperpigmented scabs as well Ext- Varicose veins Bilat up to hip area, no edema    Procedure- Punch biopsy Procedure explained to patient questions answered benefits and risks  discussed verbal consent obtained. Antiseptic-Betadine Anesthesia-lidocaine 1% 36mm Punch Biopsy  Minimal blood loss, single interrupted 4 ethilon stitch placed  Patient tolerated procedure well Bandage applied        Assessment & Plan:      Problem List Items Addressed This Visit      Unprioritized   Varicose vein of leg    bilat varicose veins extensive Refer back to vascular who has treated before        Other Visit Diagnoses    Rash and nonspecific skin eruption    -  Primary   concern for viral etiology probable pityriasis or other viral lesion, does not appear to be shingles and wide spread, no pustular lesions. Punch biopsy done, prednisone due to intense itching    Relevant Orders   Pathology Report      Note: This dictation was prepared with Dragon dictation along with smaller phrase technology. Any transcriptional errors that result from this process are unintentional.

## 2017-12-17 NOTE — Assessment & Plan Note (Signed)
bilat varicose veins extensive Refer back to vascular who has treated before

## 2017-12-20 LAB — PATHOLOGY

## 2017-12-20 LAB — TISSUE SPECIMEN

## 2017-12-24 ENCOUNTER — Other Ambulatory Visit: Payer: Self-pay | Admitting: Family Medicine

## 2017-12-24 ENCOUNTER — Ambulatory Visit: Payer: Medicare HMO | Admitting: Family Medicine

## 2017-12-25 ENCOUNTER — Encounter: Payer: Self-pay | Admitting: Family Medicine

## 2017-12-25 ENCOUNTER — Ambulatory Visit (INDEPENDENT_AMBULATORY_CARE_PROVIDER_SITE_OTHER): Payer: Medicare HMO | Admitting: Family Medicine

## 2017-12-25 VITALS — BP 130/76 | HR 73 | Temp 98.4°F | Resp 14 | Wt 251.2 lb

## 2017-12-25 DIAGNOSIS — L439 Lichen planus, unspecified: Secondary | ICD-10-CM | POA: Diagnosis not present

## 2017-12-25 NOTE — Patient Instructions (Addendum)
Referral to dermatology if it does not improve  F/U as previous   Lichen Planus Lichen planus is a skin problem that causes redness, itching, small bumps, and sores. It can affect the skin in any area of the body. Some common areas affected include:  Arms, wrists, legs, or ankles.  Chest, back, or abdomen.  Genital areas such as the vulva and vagina.  Gums and inside of the mouth.  Scalp.  Fingernails or toenails.  Treatment can help control the symptoms of this condition. The condition can last for a long time. It can take 6-18 months for it to go away. What are the causes? The exact cause of this condition is not known. The condition is not passed from one person to another (not contagious). It may be related to an allergy or an autoimmune response. An autoimmune response occurs when the body's defense system (immune system) mistakenly attacks healthy tissues. What increases the risk? This condition is more likely to develop in:  People who are older than 63 years of age.  People who take certain medicines.  People who have been exposed to certain dyes or chemicals.  People with hepatitis C.  What are the signs or symptoms? Symptoms of this condition may include:  Itching, which can be severe.  Small reddish or purple bumps on the skin. These may have flat tops and may be round or irregular shaped.  Redness or white patches on the gums or tongue.  Redness, soreness, or a burning feeling in the genital area. This may lead to pain or bleeding during sex.  Changes in the fingernails or toenails. The nails may become thin or rough. They may have ridges in them.  Redness or irritation of the eyes. This is rare.  How is this diagnosed? This condition may be diagnosed based on:  A physical exam. The health care provider will examine your affected skin and check for changes inside your mouth.  Removal of a tissue sample (biopsy sample) to be looked at under a  microscope.  How is this treated? Treatment for this condition may depend on the severity of symptoms. In some cases, no treatment is needed. If treatment is needed to control symptoms, it may include:  Creams or ointments (topical steroids) to help control itching and irritation.  Medicine to be taken by mouth.  A treatment in which your skin is exposed to ultraviolet light (phototherapy).  Lozenges that you suck on to help treat sores in the mouth.  Follow these instructions at home:  Take over-the-counter and prescription medicines only as told by your health care provider.  Use creams or ointments as told by your health care provider.  Do not scratch the affected areas of skin.  Women should keep the vaginal area as clean and dry as possible. Contact a health care provider if:  You have increasing redness, swelling, or pain in the affected area.  You have fluid, blood, or pus coming from the affected area.  Your eyes become irritated. This information is not intended to replace advice given to you by your health care provider. Make sure you discuss any questions you have with your health care provider. Document Released: 02/23/2011 Document Revised: 03/09/2016 Document Reviewed: 12/28/2014 Elsevier Interactive Patient Education  [employer].

## 2017-12-25 NOTE — Progress Notes (Signed)
   Subjective:    Patient ID: [name] [name], female    DOB: Jul 18, 1956, 62 y.o.   MRN: [id number]  Patient presents for Suture / Staple Removal   Pt here for suture removal and biopsy results  See previous visit for rash present for a couple of months  Punch biopsy reveals Lichen planus  She was put on steroids , she states her skin feels better, no itching or tingling, no change in the rash      Review Of Systems:  GEN- denies fatigue, fever, weight loss,weakness, recent illness HEENT- denies eye drainage, change in vision, nasal discharge, CVS- denies chest pain, palpitations RESP- denies SOB, cough, wheeze ABD- denies N/V, change in stools, abd pain GU- denies dysuria, hematuria, dribbling, incontinence MSK- denies joint pain, muscle aches, injury Neuro- denies headache, dizziness, syncope, seizure activity       Objective:    BP 130/76   Pulse 73   Temp 98.4 F (36.9 C) (Oral)   Resp 14   Wt 251 lb 3.2 oz (113.9 kg)   LMP 06/03/2012   SpO2 97%   BMI 45.95 kg/m  GEN- NAD, alert and oriented x3 Biopsy site- Right leg d/ci  Skin- Skin- collarette scaley lesions appearing lesions various sizes scattered on legs, buttocks, few on lower back 2-3 lesions on arms, hyperpigmented scabs as well   Suture removed at bedside, no bleeding      Assessment & Plan:      Problem List Items Addressed This Visit    None    Visit Diagnoses    Lichen planus    -  Primary   inflammatory condition unknown cause, no HIV/HEP C. Symptoms improving with the steroid. She wasnts to hold on dermatology for now      Note: This dictation was prepared with Dragon dictation along with smaller phrase technology. Any transcriptional errors that result from this process are unintentional.

## 2017-12-26 ENCOUNTER — Encounter: Payer: Self-pay | Admitting: Family Medicine

## 2018-01-15 ENCOUNTER — Encounter: Payer: Self-pay | Admitting: Family Medicine

## 2018-01-15 ENCOUNTER — Ambulatory Visit (INDEPENDENT_AMBULATORY_CARE_PROVIDER_SITE_OTHER): Payer: Medicare HMO | Admitting: Family Medicine

## 2018-01-15 ENCOUNTER — Other Ambulatory Visit: Payer: Self-pay

## 2018-01-15 VITALS — BP 132/80 | HR 88 | Temp 97.9°F | Resp 14 | Ht 62.0 in | Wt 254.0 lb

## 2018-01-15 DIAGNOSIS — L439 Lichen planus, unspecified: Secondary | ICD-10-CM | POA: Diagnosis not present

## 2018-01-15 DIAGNOSIS — R21 Rash and other nonspecific skin eruption: Secondary | ICD-10-CM | POA: Diagnosis not present

## 2018-01-15 MED ORDER — ALPRAZOLAM 1 MG PO TABS: 1.0000 mg | ORAL_TABLET | Freq: Two times a day (BID) | ORAL | 2 refills | Status: DC | PRN

## 2018-01-15 NOTE — Patient Instructions (Signed)
F/U as previous  Referral to dermatology

## 2018-01-15 NOTE — Progress Notes (Signed)
   Subjective:    Patient ID: [name] [name], female    DOB: 05/01/1956, 62 y.o.   MRN: [id number]  Patient presents for Rash (small red bumps to skin on arms and legs- improved while on steroid, then worsened after steroid)  Here with persistent rash.  Had a biopsy performed when she follow-up on the march 12th diagnosis was lichen planus.  She was given steroids which actually improved the intent itch she did not had a newly new lesions with states when that wore off she now has the itching again.  She is also noted a few new spots on her arms and on her right lower leg.  The others have dried up but have not actually gone away.  She would like to go ahead and proceed with dermatology referral  Request refill on xanax   Review Of Systems:  GEN- denies fatigue, fever, weight loss,weakness, recent illness HEENT- denies eye drainage, change in vision, nasal discharge, CVS- denies chest pain, palpitations RESP- denies SOB, cough, wheeze ABD- denies N/V, change in stools, abd pain        Objective:    BP 132/80   Pulse 88   Temp 97.9 F (36.6 C) (Oral)   Resp 14   Ht 5\' 2"  (1.575 m)   Wt 254 lb (115.2 kg)   LMP 06/03/2012   SpO2 98%   BMI 46.46 kg/m  GEN- NAD, alert and oriented x3 Skin- Skin- collarette scaley lesions appearing lesions various sizes scattered on legs, buttocks, right ankle, few on lower back 2-3 lesions ,on arms, hyperpigmented scabs as well NT, no erythema         Assessment & Plan:      Problem List Items Addressed This Visit    None    Visit Diagnoses    Lichen planus    -  Primary   Will get urgent visit with dermatology, some improvement with steroid, but still has new lesions. no other changes in meds. Pt agreeable to plain, will not start anything else new   Relevant Orders   Ambulatory referral to Dermatology   Rash and other nonspecific skin eruption       Relevant Orders   Ambulatory referral to Dermatology      Note: This dictation  was prepared with Dragon dictation along with smaller phrase technology. Any transcriptional errors that result from this process are unintentional.

## 2018-01-29 ENCOUNTER — Telehealth: Payer: Self-pay | Admitting: *Deleted

## 2018-01-29 ENCOUNTER — Other Ambulatory Visit: Payer: Self-pay | Admitting: Family Medicine

## 2018-01-29 NOTE — Telephone Encounter (Signed)
She already has the clobetasol cream You can switch to the regular clobetasol but needs tobe seen by dermatology

## 2018-01-29 NOTE — Telephone Encounter (Signed)
Received call from patient.   Reports that she has scheduled appointment with dermatology for next month. States that she cannot be seen any sooner.   Requested MD to prescribe cream or ointment that she can use for the itching and burning of Lichen Planus.   MD please advise.

## 2018-01-30 MED ORDER — CLOTRIMAZOLE-BETAMETHASONE 1-0.05 % EX CREA: 1.0000 "application " | TOPICAL_CREAM | Freq: Two times a day (BID) | CUTANEOUS | 0 refills | Status: DC

## 2018-01-30 NOTE — Telephone Encounter (Signed)
Call placed to patient. LMTRC.  

## 2018-01-30 NOTE — Telephone Encounter (Signed)
Call placed to patient and patient made aware.   Prescription sent to pharmacy.  

## 2018-02-21 ENCOUNTER — Other Ambulatory Visit: Payer: Self-pay

## 2018-02-21 DIAGNOSIS — I83893 Varicose veins of bilateral lower extremities with other complications: Secondary | ICD-10-CM

## 2018-02-25 ENCOUNTER — Other Ambulatory Visit: Payer: Self-pay | Admitting: Family Medicine

## 2018-02-27 DIAGNOSIS — L439 Lichen planus, unspecified: Secondary | ICD-10-CM | POA: Diagnosis not present

## 2018-03-04 ENCOUNTER — Other Ambulatory Visit: Payer: Self-pay | Admitting: Family Medicine

## 2018-03-12 ENCOUNTER — Ambulatory Visit (INDEPENDENT_AMBULATORY_CARE_PROVIDER_SITE_OTHER): Payer: Medicare HMO | Admitting: Family Medicine

## 2018-03-12 ENCOUNTER — Encounter: Payer: Self-pay | Admitting: Family Medicine

## 2018-03-12 ENCOUNTER — Other Ambulatory Visit: Payer: Self-pay

## 2018-03-12 VITALS — BP 132/72 | HR 70 | Temp 98.5°F | Resp 14 | Ht 62.0 in | Wt 248.0 lb

## 2018-03-12 DIAGNOSIS — E669 Obesity, unspecified: Secondary | ICD-10-CM | POA: Diagnosis not present

## 2018-03-12 DIAGNOSIS — L439 Lichen planus, unspecified: Secondary | ICD-10-CM | POA: Diagnosis not present

## 2018-03-12 DIAGNOSIS — J069 Acute upper respiratory infection, unspecified: Secondary | ICD-10-CM

## 2018-03-12 DIAGNOSIS — E7800 Pure hypercholesterolemia, unspecified: Secondary | ICD-10-CM

## 2018-03-12 DIAGNOSIS — I10 Essential (primary) hypertension: Secondary | ICD-10-CM

## 2018-03-12 DIAGNOSIS — K1370 Unspecified lesions of oral mucosa: Secondary | ICD-10-CM

## 2018-03-12 DIAGNOSIS — E1169 Type 2 diabetes mellitus with other specified complication: Secondary | ICD-10-CM | POA: Diagnosis not present

## 2018-03-12 MED ORDER — AMOXICILLIN 500 MG PO CAPS: 500.0000 mg | ORAL_CAPSULE | Freq: Two times a day (BID) | ORAL | 0 refills | Status: DC

## 2018-03-12 MED ORDER — HYDROCODONE-ACETAMINOPHEN 5-325 MG PO TABS: 1.0000 | ORAL_TABLET | Freq: Four times a day (QID) | ORAL | 0 refills | Status: DC | PRN

## 2018-03-12 NOTE — Patient Instructions (Addendum)
Release of records- Dr. [facility] [address] Oral surgery Take coricidan or robitussin DM Take antibiotics  F/U 4 months

## 2018-03-12 NOTE — Progress Notes (Signed)
Subjective:    Patient ID: [name] [name], female    DOB: 10-09-1956, 62 y.o.   MRN: [id number]  Patient presents for Follow-up (is fasting) and Illness (x3 days- productive cough with green mucus, chest congestion)  Pt here to f/u chronic medical problems Seen by dermatology who confirmed lichen planus, she now has lesions in her mouth  was seen by dentist initially with having bleeding gums and mucousa, she was then referred to oral surgeon but told teeth were normal She was prescribed a mouth rinse but it was very expensive so she did not get  She was also prescribed a cream for itching but unable to afford     PVD- holding on vein doctor due to cost   Has cough with production, scratchy throat, no fever. Nasal drainage.   Taking cough drops, she has had some wheezing  Did not use albuterol  Mother had a little cold   HTN- taking BP meds as prescribed   DM- diet controlled, due for A1C , this AM fasting was  150  Hyperliidemia- taking crestor   Request refill on pain medicine, also using for her mouth lesions, hydrocodone given last in December     Review Of Systems:  GEN- denies fatigue, fever, weight loss,weakness, recent illness HEENT- denies eye drainage, change in vision, nasal discharge, CVS- denies chest pain, palpitations RESP- denies SOB, cough, wheeze ABD- denies N/V, change in stools, abd pain GU- denies dysuria, hematuria, dribbling, incontinence MSK- denies joint pain, muscle aches, injury Neuro- denies headache, dizziness, syncope, seizure activity       Objective:    BP 132/72   Pulse 70   Temp 98.5 F (36.9 C) (Oral)   Resp 14   Ht 5\' 2"  (1.575 m)   Wt 248 lb (112.5 kg)   LMP 06/03/2012   SpO2 97%   BMI 45.36 kg/m  GEN- NAD, alert and oriented x3 HEENT- PERRL, EOMI, non injected sclera, pink conjunctiva, MMM, oropharynx hypogpiemented erythematous plaque like lesions in mouth along buccal mucosa, post oropharynx clear  Neck- Supple, no  thyromegaly, shotty lymphadenopathy CVS- RRR, no murmur RESP-CTAB ABD-NABS,soft,NT,ND Skin hyperpigmented plaque-like lesions on arms abdomen legs EXT- Ankle edema Pulses- Radial, DP- 2+        Assessment & Plan:      Problem List Items Addressed This Visit      Unprioritized   Hyperlipidemia   Obesity (BMI 30-39.9)   Lichen planus    F/u dermatology, currently she was not able to afford the last medication they prescribed advised her to call them back to see if there is something cheaper to use for this.  Cream to have given her have worked.      HTN, goal below 130/80    Blood pressure is at goal no changes      Relevant Orders   CBC with Differential/Platelet (Completed)   Comprehensive metabolic panel (Completed)   Diabetes mellitus type 2 in obese ([place]) - Primary    Currently diet controlled we will recheck the A1c in the setting of the other autoimmune inflammatory conditions going on her body at this time.      Relevant Orders   CBC with Differential/Platelet (Completed)   Comprehensive metabolic panel (Completed)   Hemoglobin A1c (Completed)   Lipid panel (Completed)    Other Visit Diagnoses    Oral mucosal lesion       She has LAD in setting of the inflammed lesions, will give antibiotics also with  URI, advised to contact oral surgeon about alternative to the medication    Upper respiratory tract infection, unspecified type       OTC cough meds      Note: This dictation was prepared with Dragon dictation along with smaller phrase technology. Any transcriptional errors that result from this process are unintentional.

## 2018-03-13 ENCOUNTER — Encounter: Payer: Self-pay | Admitting: Family Medicine

## 2018-03-13 LAB — CBC WITH DIFFERENTIAL/PLATELET
Basophils Absolute: 33 cells/uL (ref 0–200)
Basophils Relative: 0.5 %
Eosinophils Absolute: 310 cells/uL (ref 15–500)
Eosinophils Relative: 4.7 %
HEMATOCRIT: 37 % (ref 35.0–45.0)
HEMOGLOBIN: 12.2 g/dL (ref 11.7–15.5)
LYMPHS ABS: 1280 {cells}/uL (ref 850–3900)
MCH: 28.2 pg (ref 27.0–33.0)
MCHC: 33 g/dL (ref 32.0–36.0)
MCV: 85.6 fL (ref 80.0–100.0)
MPV: 9.4 fL (ref 7.5–12.5)
Monocytes Relative: 8.9 %
NEUTROS ABS: 4389 {cells}/uL (ref 1500–7800)
Neutrophils Relative %: 66.5 %
Platelets: 242 10*3/uL (ref 140–400)
RBC: 4.32 10*6/uL (ref 3.80–5.10)
RDW: 13 % (ref 11.0–15.0)
Total Lymphocyte: 19.4 %
WBC mixed population: 587 cells/uL (ref 200–950)
WBC: 6.6 10*3/uL (ref 3.8–10.8)

## 2018-03-13 LAB — COMPREHENSIVE METABOLIC PANEL
AG RATIO: 1.3 (calc) (ref 1.0–2.5)
ALBUMIN MSPROF: 4.2 g/dL (ref 3.6–5.1)
ALT: 12 U/L (ref 6–29)
AST: 19 U/L (ref 10–35)
Alkaline phosphatase (APISO): 137 U/L — ABNORMAL HIGH (ref 33–130)
BILIRUBIN TOTAL: 0.6 mg/dL (ref 0.2–1.2)
BUN: 16 mg/dL (ref 7–25)
CALCIUM: 9.4 mg/dL (ref 8.6–10.4)
CO2: 29 mmol/L (ref 20–32)
Chloride: 102 mmol/L (ref 98–110)
Creat: 0.81 mg/dL (ref 0.50–0.99)
GLUCOSE: 131 mg/dL — AB (ref 65–99)
Globulin: 3.2 g/dL (calc) (ref 1.9–3.7)
POTASSIUM: 4.3 mmol/L (ref 3.5–5.3)
SODIUM: 140 mmol/L (ref 135–146)
Total Protein: 7.4 g/dL (ref 6.1–8.1)

## 2018-03-13 LAB — HEMOGLOBIN A1C
HEMOGLOBIN A1C: 8 %{Hb} — AB (ref <5.7)
Mean Plasma Glucose: 183 (calc)
eAG (mmol/L): 10.1 (calc)

## 2018-03-13 LAB — LIPID PANEL
Cholesterol: 116 mg/dL (ref <200)
HDL: 39 mg/dL — ABNORMAL LOW (ref >50)
LDL CHOLESTEROL (CALC): 60 mg/dL
NON-HDL CHOLESTEROL (CALC): 77 mg/dL (ref <130)
Total CHOL/HDL Ratio: 3 (calc) (ref <5.0)
Triglycerides: 89 mg/dL (ref <150)

## 2018-03-13 NOTE — Assessment & Plan Note (Signed)
Currently diet controlled we will recheck the A1c in the setting of the other autoimmune inflammatory conditions going on her body at this time.

## 2018-03-13 NOTE — Assessment & Plan Note (Signed)
F/u dermatology, currently she was not able to afford the last medication they prescribed advised her to call them back to see if there is something cheaper to use for this.  Cream to have given her have worked.

## 2018-03-13 NOTE — Assessment & Plan Note (Signed)
Blood pressure is at goal no changes

## 2018-03-14 ENCOUNTER — Other Ambulatory Visit: Payer: Self-pay | Admitting: *Deleted

## 2018-03-14 MED ORDER — METFORMIN HCL 500 MG PO TABS: 500.0000 mg | ORAL_TABLET | Freq: Every day | ORAL | 3 refills | Status: DC

## 2018-03-18 DIAGNOSIS — L439 Lichen planus, unspecified: Secondary | ICD-10-CM | POA: Diagnosis not present

## 2018-03-18 DIAGNOSIS — L438 Other lichen planus: Secondary | ICD-10-CM | POA: Diagnosis not present

## 2018-04-03 ENCOUNTER — Encounter: Payer: Medicare HMO | Admitting: Vascular Surgery

## 2018-04-03 ENCOUNTER — Encounter (HOSPITAL_COMMUNITY): Payer: Medicare HMO

## 2018-04-08 ENCOUNTER — Other Ambulatory Visit: Payer: Self-pay | Admitting: Family Medicine

## 2018-04-22 ENCOUNTER — Other Ambulatory Visit: Payer: Self-pay | Admitting: Family Medicine

## 2018-05-01 DIAGNOSIS — L439 Lichen planus, unspecified: Secondary | ICD-10-CM | POA: Diagnosis not present

## 2018-05-07 ENCOUNTER — Other Ambulatory Visit: Payer: Self-pay | Admitting: Family Medicine

## 2018-05-20 ENCOUNTER — Other Ambulatory Visit: Payer: Self-pay | Admitting: Family Medicine

## 2018-05-20 NOTE — Telephone Encounter (Signed)
Ok to refill??  Last office visit 03/12/2018.  Last refill 01/15/2018, #2 refills.

## 2018-06-10 ENCOUNTER — Ambulatory Visit (INDEPENDENT_AMBULATORY_CARE_PROVIDER_SITE_OTHER): Payer: Medicare HMO | Admitting: Otolaryngology

## 2018-06-10 DIAGNOSIS — R490 Dysphonia: Secondary | ICD-10-CM | POA: Diagnosis not present

## 2018-06-18 ENCOUNTER — Ambulatory Visit: Payer: Medicare HMO | Admitting: Family Medicine

## 2018-06-19 DIAGNOSIS — L438 Other lichen planus: Secondary | ICD-10-CM | POA: Diagnosis not present

## 2018-06-20 ENCOUNTER — Ambulatory Visit (INDEPENDENT_AMBULATORY_CARE_PROVIDER_SITE_OTHER): Payer: Medicare HMO | Admitting: Family Medicine

## 2018-06-20 ENCOUNTER — Encounter: Payer: Self-pay | Admitting: Family Medicine

## 2018-06-20 ENCOUNTER — Other Ambulatory Visit: Payer: Self-pay

## 2018-06-20 VITALS — BP 130/82 | HR 68 | Temp 98.4°F | Resp 16 | Ht 62.0 in | Wt 246.6 lb

## 2018-06-20 DIAGNOSIS — E1169 Type 2 diabetes mellitus with other specified complication: Secondary | ICD-10-CM | POA: Diagnosis not present

## 2018-06-20 DIAGNOSIS — Z23 Encounter for immunization: Secondary | ICD-10-CM

## 2018-06-20 DIAGNOSIS — E669 Obesity, unspecified: Secondary | ICD-10-CM

## 2018-06-20 DIAGNOSIS — I10 Essential (primary) hypertension: Secondary | ICD-10-CM | POA: Diagnosis not present

## 2018-06-20 MED ORDER — ZOSTER VAC RECOMB ADJUVANTED 50 MCG/0.5ML IM SUSR: 0.5000 mL | Freq: Once | INTRAMUSCULAR | 1 refills | Status: AC

## 2018-06-20 NOTE — Progress Notes (Signed)
Patient ID: [name] [name], female    DOB: [birth year], 62 y.o.   MRN: [id number]  PCP: Alycia Rossetti, MD  Chief Complaint  Patient presents with  . Flu Vaccine  . Diabetes    follow up     Subjective:   [name] [name] is a 62 y.o. female, presents to clinic with CC of   Diabetes Mellitus Type II, Follow-up:   Lab Results  Component Value Date   HGBA1C 8.0 (H) 03/12/2018   HGBA1C 6.4 (H) 09/12/2017   HGBA1C 6.5 (H) 05/11/2017    Last seen for diabetes 3 months ago.  Management since then includes metformin 500 mg once in the am. She reports excellent compliance with treatment. She is not having side effects.  Current symptoms include none and have been stable. Home blood sugar records: fasting range: 120-136  Episodes of hypoglycemia? no   Current Insulin Regimen: no Most Recent Eye Exam: scheduled in November Weight trend: decreasing steadily this year less than 10 lbs lost Prior visit with dietician: yes - its been a little bit - "I know what they tell you to eat" Current diet: in general, a "healthy" diet   likes yogurt, salad, apple sauce, doing a lot of greens and beans , wheat bread Rarely has treats, fried foods Current exercise: walking  Pertinent Labs:    Component Value Date/Time   CHOL 116 03/12/2018 1130   TRIG 89 03/12/2018 1130   HDL 39 (L) 03/12/2018 1130   LDLCALC 60 03/12/2018 1130   CREATININE 0.81 03/12/2018 1130    Wt Readings from Last 3 Encounters:  06/20/18 246 lb 9.6 oz (111.9 kg)  03/12/18 248 lb (112.5 kg)  01/15/18 254 lb (115.2 kg)    ------------------------------------------------------------------------ Lichen Planus - gone to ENT/Dr. Benjamine Mola and she is going to a specialist at [facility] for sores in her mouth.   HTN - goal of 130/80 did not take her meds this am.  No HA, CP, SOB, swelling  Patient Active Problem List   Diagnosis Date Noted  . Lichen planus [id number]/5956  . Varicose vein of leg 12/17/2017  . Retinopathy  due to secondary diabetes mellitus, without macular edema, with mild nonproliferative retinopathy ([place]) 09/17/2017  . Stress at home 01/09/2017  . Allergic rhinitis 01/04/2016  . Leg swelling 05/25/2015  . Chest congestion 11/17/2014  . Vaginitis and vulvovaginitis 11/17/2014  . Hoarse voice quality 07/22/2014  . Osteoarthritis of right knee 12/31/2012  . Endometrial cancer ([place]) 08/25/2012  . Hot flashes due to surgical menopause 08/20/2012  . Neuralgia 02/26/2012  . HTN, goal below 130/80 06/29/2011  . Arm pain, left 06/28/2011  . Carotid artery bruit 05/15/2011  . Palpitations 05/15/2011  . OTHER DYSPHAGIA 12/08/2010  . CONSTIPATION, CHRONIC 12/01/2009  . Diabetes mellitus type 2 in obese ([place]) 03/31/2009  . Obesity (BMI 30-39.9) 01/24/2007  . Hyperlipidemia 09/25/2006  . Anxiety state 09/25/2006  . GLAUCOMA NOS 09/25/2006  . GERD 09/25/2006  . Chronic pain 09/25/2006     Prior to Admission medications   Medication Sig Start Date End Date Taking? Authorizing Provider  ACCU-CHEK FASTCLIX LANCETS MISC TEST FASTING BLOOD SUGAR TWICE PER DAY 05/07/18  Yes [place], Modena Nunnery, MD  ACCU-CHEK SMARTVIEW test strip TEST FASTING BLOOD SUGAR TWICE PER DAY 04/23/18  Yes [place], Modena Nunnery, MD  Alcohol Swabs (B-D SINGLE USE SWABS REGULAR) PADS USE TWICE DAILY 02/26/18  Yes [place], Modena Nunnery, MD  ALPRAZolam Duanne Moron) 1 MG tablet TAKE 1 TABLET(1 MG)  BY MOUTH TWICE DAILY AS NEEDED 05/20/18  Yes [place], Modena Nunnery, MD  amLODipine (NORVASC) 10 MG tablet TAKE 1 TABLET EVERY MORNING 03/05/18  Yes [place], Modena Nunnery, MD  aspirin 325 MG tablet Take 325 mg by mouth every morning.    Yes [provider]  Blood Glucose Calibration (ACCU-CHEK SMARTVIEW CONTROL) LIQD USE AS DIRECTED 05/07/18  Yes [place], Modena Nunnery, MD  Blood Glucose Monitoring Suppl w/Device KIT TEST FASTING BLOOD SUGAR TWICE PER DAY DX; E11.9 04/17/17  Yes [place], Modena Nunnery, MD  Cholecalciferol (VITAMIN D-3) 1000 UNITS CAPS Take by mouth.    Yes [provider]  co-enzyme Q-10 30 MG capsule Take 1 capsule (30 mg total) by mouth 3 (three) times daily. 01/09/17  Yes [place], Modena Nunnery, MD  fluticasone [facility]) 50 MCG/ACT nasal spray USE 2 SPRAYS IN EACH NOSTRIL EVERY DAY 10/23/17  Yes [place], Modena Nunnery, MD  furosemide (LASIX) 20 MG tablet TAKE 1 TABLET  DAILY AS NEEDED. 01/29/18  Yes [place], Modena Nunnery, MD  HYDROcodone-acetaminophen (NORCO) 5-325 MG tablet Take 1 tablet by mouth every 6 (six) hours as needed for moderate pain. 03/12/18  Yes [place], Modena Nunnery, MD  Lancet Devices (ADJUSTABLE LANCING DEVICE) MISC TEST FASTING BLOOD SUGAR TWICE PER DAY DX; E11.9 04/17/17  Yes [place], Modena Nunnery, MD  losartan (COZAAR) 50 MG tablet TAKE 1 TABLET EVERY DAY 03/05/18  Yes [place], Modena Nunnery, MD  meclizine (ANTIVERT) 12.5 MG tablet Take 1 tablet (12.5 mg total) by mouth 3 (three) times daily as needed for dizziness. 11/05/17  Yes [place], Modena Nunnery, MD  metFORMIN (GLUCOPHAGE) 500 MG tablet Take 1 tablet (500 mg total) by mouth daily with breakfast. 03/14/18  Yes [place], Modena Nunnery, MD  Multiple Vitamins-Minerals (CENTRUM ADULTS PO) Take by mouth.   Yes [provider]  naproxen (NAPROSYN) 500 MG tablet TAKE 1 TABLET TWICE DAILY WITH MEALS 05/07/18  Yes [place], Modena Nunnery, MD  omeprazole (PRILOSEC) 20 MG capsule TAKE 1 CAPSULE EVERY DAY 05/07/18  Yes [place], Modena Nunnery, MD  polyethylene glycol powder (GLYCOLAX/MIRALAX) powder MIX 1 CAPFUL (17GM) IN 8 OUNCES OF WATER AND DRINK EVERY DAY 07/04/17  Yes [place], Modena Nunnery, MD  rosuvastatin (CRESTOR) 10 MG tablet TAKE 1 TABLET AT BEDTIME 05/07/18  Yes [place], Modena Nunnery, MD  spironolactone (ALDACTONE) 25 MG tablet TAKE 1 TABLET EVERY MORNING 03/05/18  Yes [place], Modena Nunnery, MD  triamcinolone cream (KENALOG) 0.1 % APPLY TOPICALLY TO VULVA 2 TIMES DAILY 05/07/18  Yes [place], Modena Nunnery, MD  vitamin B-12 (CYANOCOBALAMIN) 1000 MCG tablet Take 1 tablet (1,000 mcg total) by mouth daily. 01/09/17  Yes [place], Modena Nunnery,  MD  acitretin (SORIATANE) 25 MG capsule Take 25 mg by mouth daily. 06/19/18   [provider]     Allergies  Allergen Reactions  . Contrast Media [Iodinated Diagnostic Agents] Hives  . Lisinopril     REACTION: cough  . Buspar [Buspirone Hcl] Palpitations     Family History  Problem Relation Age of Onset  . Hypertension Father   . Diabetes Father   . Diabetes Mother   . Healthy Sister   . Heart disease Daughter   . Healthy Daughter   . Arrhythmia Daughter        Defibrillator   . Scoliosis Daughter      Social History   Socioeconomic History  . Marital status: Widowed    Spouse name: Not on file  . Number of children: Not on file  . Years of  education: Not on file  . Highest education level: Not on file  Occupational History  . Not on file  Social Needs  . Financial resource strain: Not on file  . Food insecurity:    Worry: Not on file    Inability: Not on file  . Transportation needs:    Medical: Not on file    Non-medical: Not on file  Tobacco Use  . Smoking status: Never Smoker  . Smokeless tobacco: Never Used  Substance and Sexual Activity  . Alcohol use: No  . Drug use: No  . Sexual activity: Not Currently  Lifestyle  . Physical activity:    Days per week: Not on file    Minutes per session: Not on file  . Stress: Not on file  Relationships  . Social connections:    Talks on phone: Not on file    Gets together: Not on file    Attends religious service: Not on file    Active member of club or organization: Not on file    Attends meetings of clubs or organizations: Not on file    Relationship status: Not on file  . Intimate partner violence:    Fear of current or ex partner: Not on file    Emotionally abused: Not on file    Physically abused: Not on file    Forced sexual activity: Not on file  Other Topics Concern  . Not on file  Social History Narrative   Lives with children     Review of Systems  Constitutional: Negative.     HENT: Negative.   Eyes: Negative.   Respiratory: Negative.   Cardiovascular: Negative.   Gastrointestinal: Negative.   Endocrine: Negative.   Genitourinary: Negative.   Musculoskeletal: Negative.   Skin: Negative.   Allergic/Immunologic: Negative.   Neurological: Negative.   Hematological: Negative.   Psychiatric/Behavioral: Negative.   All other systems reviewed and are negative.      Objective:    Vitals:   06/20/18 1359  BP: 130/82  Pulse: 68  Resp: 16  Temp: 98.4 F (36.9 C)  TempSrc: Oral  SpO2: 97%  Weight: 246 lb 9.6 oz (111.9 kg)  Height: '5\' 2"'$  (1.575 m)      Physical Exam  Constitutional: She appears well-developed and well-nourished. No distress.  HENT:  Head: Normocephalic and atraumatic.  Right Ear: External ear normal.  Left Ear: External ear normal.  Nose: Nose normal.  Mouth/Throat: Oropharynx is clear and moist.  Eyes: Pupils are equal, round, and reactive to light. Conjunctivae are normal. Right eye exhibits no discharge. Left eye exhibits no discharge.  Neck: Normal range of motion. No tracheal deviation present.  Cardiovascular: Normal rate, regular rhythm, normal heart sounds and intact distal pulses. Exam reveals no gallop and no friction rub.  No murmur heard. Pulmonary/Chest: Effort normal and breath sounds normal. No stridor. No respiratory distress. She has no wheezes. She has no rales. She exhibits no tenderness.  Abdominal: Soft. Bowel sounds are normal. She exhibits no distension and no mass. There is no tenderness. There is no guarding.  Musculoskeletal: Normal range of motion.  Neurological: She is alert. She exhibits normal muscle tone. Coordination normal.  Skin: Skin is warm and dry. Capillary refill takes less than 2 seconds. No rash noted. She is not diaphoretic.  Psychiatric: She has a normal mood and affect. Her behavior is normal.  Nursing note and vitals reviewed.         Assessment & Plan:  ICD-10-CM   1.  Diabetes mellitus type 2 in obese (HCC) E11.69 Hemoglobin A1c   H68.6 COMPLETE METABOLIC PANEL WITH GFR    Microalbumin, urine   A1C increases over past year, labs to reevaluate   2. HTN, goal below 130/80 I10    near goal w/o taking meds this morning, con't current meds and diet  3. Obesity (BMI 30-39.9) E66.9    con't diet changes, try to increase exercise  4. Need for immunization against influenza Z23 Flu Vaccine QUAD 36+ mos IM    Need for shingles vaccine - sent to [place]  Flu done today  Repeat HbA1C and CMP - microalbumin urine done today, pt going to get eye exam done  She is loosing weight slowly, does not want diabetes/nutritional referral    Delsa Grana, PA-C 06/20/18 2:15 PM

## 2018-06-20 NOTE — Patient Instructions (Signed)
Managing Your Hypertension Hypertension is commonly called [place] blood pressure. This is when the force of your blood pressing against the walls of your arteries is too strong. Arteries are blood vessels that carry blood from your heart throughout your body. Hypertension forces the heart to work harder to pump blood, and may cause the arteries to become narrow or stiff. Having untreated or uncontrolled hypertension can cause heart attack, stroke, kidney disease, and other problems. What are blood pressure readings? A blood pressure reading consists of a higher number over a lower number. Ideally, your blood pressure should be below 120/80. The first ("top") number is called the systolic pressure. It is a measure of the pressure in your arteries as your heart beats. The second ("bottom") number is called the diastolic pressure. It is a measure of the pressure in your arteries as the heart relaxes. What does my blood pressure reading mean? Blood pressure is classified into four stages. Based on your blood pressure reading, your health care provider may use the following stages to determine what type of treatment you need, if any. Systolic pressure and diastolic pressure are measured in a unit called mm Hg. Normal  Systolic pressure: below 300.  Diastolic pressure: below 80. Elevated  Systolic pressure: 923-300.  Diastolic pressure: below 80. Hypertension stage 1  Systolic pressure: 762-263.  Diastolic pressure: 33-54. Hypertension stage 2  Systolic pressure: 562 or above.  Diastolic pressure: 90 or above. What health risks are associated with hypertension? Managing your hypertension is an important responsibility. Uncontrolled hypertension can lead to:  A heart attack.  A stroke.  A weakened blood vessel (aneurysm).  Heart failure.  Kidney damage.  Eye damage.  Metabolic syndrome.  Memory and concentration problems.  What changes can I make to manage my  hypertension? Hypertension can be managed by making lifestyle changes and possibly by taking medicines. Your health care provider will help you make a plan to bring your blood pressure within a normal range. Eating and drinking  Eat a diet that is [place] in fiber and potassium, and low in salt (sodium), added sugar, and fat. An example eating plan is called the DASH (Dietary Approaches to Stop Hypertension) diet. To eat this way: ? Eat plenty of fresh fruits and vegetables. Try to fill half of your plate at each meal with fruits and vegetables. ? Eat whole grains, such as whole wheat pasta, brown rice, or whole grain bread. Fill about one quarter of your plate with whole grains. ? Eat low-fat diary products. ? Avoid fatty cuts of meat, processed or cured meats, and poultry with skin. Fill about one quarter of your plate with lean proteins such as fish, chicken without skin, beans, eggs, and tofu. ? Avoid premade and processed foods. These tend to be higher in sodium, added sugar, and fat.  Reduce your daily sodium intake. Most people with hypertension should eat less than 1,500 mg of sodium a day.  Limit alcohol intake to no more than 1 drink a day for nonpregnant women and 2 drinks a day for men. One drink equals 12 oz of beer, 5 oz of wine, or 1 oz of hard liquor. Lifestyle  Work with your health care provider to maintain a healthy body weight, or to lose weight. Ask what an ideal weight is for you.  Get at least 30 minutes of exercise that causes your heart to beat faster (aerobic exercise) most days of the week. Activities may include walking, swimming, or biking.  Include  exercise to strengthen your muscles (resistance exercise), such as weight lifting, as part of your weekly exercise routine. Try to do these types of exercises for 30 minutes at least 3 days a week.  Do not use any products that contain nicotine or tobacco, such as cigarettes and e-cigarettes. If you need help quitting, ask  your health care provider.  Control any long-term (chronic) conditions you have, such as [place] cholesterol or diabetes. Monitoring  Monitor your blood pressure at home as told by your health care provider. Your personal target blood pressure may vary depending on your medical conditions, your age, and other factors.  Have your blood pressure checked regularly, as often as told by your health care provider. Working with your health care provider  Review all the medicines you take with your health care provider because there may be side effects or interactions.  Talk with your health care provider about your diet, exercise habits, and other lifestyle factors that may be contributing to hypertension.  Visit your health care provider regularly. Your health care provider can help you create and adjust your plan for managing hypertension. Will I need medicine to control my blood pressure? Your health care provider may prescribe medicine if lifestyle changes are not enough to get your blood pressure under control, and if:  Your systolic blood pressure is 130 or higher.  Your diastolic blood pressure is 80 or higher.  Take medicines only as told by your health care provider. Follow the directions carefully. Blood pressure medicines must be taken as prescribed. The medicine does not work as well when you skip doses. Skipping doses also puts you at risk for problems. Contact a health care provider if:  You think you are having a reaction to medicines you have taken.  You have repeated (recurrent) headaches.  You feel dizzy.  You have swelling in your ankles.  You have trouble with your vision. Get help right away if:  You develop a severe headache or confusion.  You have unusual weakness or numbness, or you feel faint.  You have severe pain in your chest or abdomen.  You vomit repeatedly.  You have trouble breathing. Summary  Hypertension is when the force of blood pumping through  your arteries is too strong. If this condition is not controlled, it may put you at risk for serious complications.  Your personal target blood pressure may vary depending on your medical conditions, your age, and other factors. For most people, a normal blood pressure is less than 120/80.  Hypertension is managed by lifestyle changes, medicines, or both. Lifestyle changes include weight loss, eating a healthy, low-sodium diet, exercising more, and limiting alcohol. This information is not intended to replace advice given to you by your health care provider. Make sure you discuss any questions you have with your health care provider. Document Released: 06/26/2012 Document Revised: 08/30/2016 Document Reviewed: 08/30/2016 Elsevier Interactive Patient Education  2018 [employer].   Diabetes Mellitus and Exercise Exercising regularly is important for your overall health, especially when you have diabetes (diabetes mellitus). Exercising is not only about losing weight. It has many health benefits, such as increasing muscle strength and bone density and reducing body fat and stress. This leads to improved fitness, flexibility, and endurance, all of which result in better overall health. Exercise has additional benefits for people with diabetes, including:  Reducing appetite.  Helping to lower and control blood glucose.  Lowering blood pressure.  Helping to control amounts of fatty substances (lipids) in  the blood, such as cholesterol and triglycerides.  Helping the body to respond better to insulin (improving insulin sensitivity).  Reducing how much insulin the body needs.  Decreasing the risk for heart disease by: ? Lowering cholesterol and triglyceride levels. ? Increasing the levels of good cholesterol. ? Lowering blood glucose levels.  What is my activity plan? Your health care provider or certified diabetes educator can help you make a plan for the type and frequency of exercise  (activity plan) that works for you. Make sure that you:  Do at least 150 minutes of moderate-intensity or vigorous-intensity exercise each week. This could be brisk walking, biking, or water aerobics. ? Do stretching and strength exercises, such as yoga or weightlifting, at least 2 times a week. ? Spread out your activity over at least 3 days of the week.  Get some form of physical activity every day. ? Do not go more than 2 days in a row without some kind of physical activity. ? Avoid being inactive for more than 90 minutes at a time. Take frequent breaks to walk or stretch.  Choose a type of exercise or activity that you enjoy, and set realistic goals.  Start slowly, and gradually increase the intensity of your exercise over time.  What do I need to know about managing my diabetes?  Check your blood glucose before and after exercising. ? If your blood glucose is higher than 240 mg/dL (13.3 mmol/L) before you exercise, check your urine for ketones. If you have ketones in your urine, do not exercise until your blood glucose returns to normal.  Know the symptoms of low blood glucose (hypoglycemia) and how to treat it. Your risk for hypoglycemia increases during and after exercise. Common symptoms of hypoglycemia can include: ? Hunger. ? Anxiety. ? Sweating and feeling clammy. ? Confusion. ? Dizziness or feeling light-headed. ? Increased heart rate or palpitations. ? Blurry vision. ? Tingling or numbness around the mouth, lips, or tongue. ? Tremors or shakes. ? Irritability.  Keep a rapid-acting carbohydrate snack available before, during, and after exercise to help prevent or treat hypoglycemia.  Avoid injecting insulin into areas of the body that are going to be exercised. For example, avoid injecting insulin into: ? The arms, when playing tennis. ? The legs, when jogging.  Keep records of your exercise habits. Doing this can help you and your health care provider adjust your  diabetes management plan as needed. Write down: ? Food that you eat before and after you exercise. ? Blood glucose levels before and after you exercise. ? The type and amount of exercise you have done. ? When your insulin is expected to peak, if you use insulin. Avoid exercising at times when your insulin is peaking.  When you start a new exercise or activity, work with your health care provider to make sure the activity is safe for you, and to adjust your insulin, medicines, or food intake as needed.  Drink plenty of water while you exercise to prevent dehydration or heat stroke. Drink enough fluid to keep your urine clear or pale yellow. This information is not intended to replace advice given to you by your health care provider. Make sure you discuss any questions you have with your health care provider. Document Released: 12/23/2003 Document Revised: 04/21/2016 Document Reviewed: 03/13/2016 Elsevier Interactive Patient Education  2018 [employer].

## 2018-06-21 LAB — COMPLETE METABOLIC PANEL WITH GFR
AG RATIO: 1.3 (calc) (ref 1.0–2.5)
ALT: 9 U/L (ref 6–29)
AST: 19 U/L (ref 10–35)
Albumin: 4 g/dL (ref 3.6–5.1)
Alkaline phosphatase (APISO): 118 U/L (ref 33–130)
BUN: 12 mg/dL (ref 7–25)
CO2: 30 mmol/L (ref 20–32)
Calcium: 9.3 mg/dL (ref 8.6–10.4)
Chloride: 102 mmol/L (ref 98–110)
Creat: 0.98 mg/dL (ref 0.50–0.99)
GFR, EST AFRICAN AMERICAN: 71 mL/min/{1.73_m2} (ref >=60)
GFR, Est Non African American: 61 mL/min/{1.73_m2} (ref >=60)
GLOBULIN: 3.2 g/dL (ref 1.9–3.7)
Glucose, Bld: 109 mg/dL — ABNORMAL HIGH (ref 65–99)
Potassium: 4.2 mmol/L (ref 3.5–5.3)
SODIUM: 140 mmol/L (ref 135–146)
Total Bilirubin: 0.5 mg/dL (ref 0.2–1.2)
Total Protein: 7.2 g/dL (ref 6.1–8.1)

## 2018-06-21 LAB — HEMOGLOBIN A1C
Hgb A1c MFr Bld: 7.2 % of total Hgb — ABNORMAL HIGH (ref <5.7)
Mean Plasma Glucose: 160 (calc)
eAG (mmol/L): 8.9 (calc)

## 2018-06-21 LAB — MICROALBUMIN, URINE: MICROALB UR: 1 mg/dL

## 2018-06-24 ENCOUNTER — Other Ambulatory Visit: Payer: Self-pay | Admitting: Family Medicine

## 2018-06-24 DIAGNOSIS — Z1231 Encounter for screening mammogram for malignant neoplasm of breast: Secondary | ICD-10-CM

## 2018-07-09 ENCOUNTER — Other Ambulatory Visit: Payer: Self-pay | Admitting: *Deleted

## 2018-07-09 NOTE — Telephone Encounter (Signed)
Received call from patient.   Requested refill on Hydrocodone/APAP.   Ok to refill??  Last office visit 06/20/2018.  Last refill 03/12/2018.

## 2018-07-10 MED ORDER — HYDROCODONE-ACETAMINOPHEN 5-325 MG PO TABS: 1.0000 | ORAL_TABLET | Freq: Four times a day (QID) | ORAL | 0 refills | Status: DC | PRN

## 2018-07-22 ENCOUNTER — Ambulatory Visit (HOSPITAL_COMMUNITY)
Admission: RE | Admit: 2018-07-22 | Discharge: 2018-07-22 | Disposition: A | Discharge status: Home / Self Care | Payer: Medicare HMO | Source: Ambulatory Visit | Attending: Family Medicine | Admitting: Family Medicine

## 2018-07-22 DIAGNOSIS — Z1231 Encounter for screening mammogram for malignant neoplasm of breast: Secondary | ICD-10-CM | POA: Diagnosis not present

## 2018-07-30 ENCOUNTER — Ambulatory Visit (INDEPENDENT_AMBULATORY_CARE_PROVIDER_SITE_OTHER): Payer: Medicare HMO | Admitting: Family Medicine

## 2018-07-30 ENCOUNTER — Other Ambulatory Visit: Payer: Self-pay

## 2018-07-30 ENCOUNTER — Encounter: Payer: Self-pay | Admitting: Family Medicine

## 2018-07-30 VITALS — BP 134/74 | HR 82 | Temp 98.6°F | Resp 14 | Ht 62.0 in | Wt 245.0 lb

## 2018-07-30 DIAGNOSIS — K1370 Unspecified lesions of oral mucosa: Secondary | ICD-10-CM

## 2018-07-30 MED ORDER — LIDOCAINE VISCOUS HCL 2 % MT SOLN: 5.0000 mL | Freq: Two times a day (BID) | OROMUCOSAL | 0 refills | Status: DC

## 2018-07-30 NOTE — Progress Notes (Signed)
Patient ID: LAURENE MELENDREZ, female    DOB: 1956-03-25, 62 y.o.   MRN: [id number]  PCP: Alycia Rossetti, MD  Chief Complaint  Patient presents with  . Jury Duty Letter    was called for jury duty and would like excuse d/t DM and Chronic Pain    Subjective:   Aarya B Martinez is a 62 y.o. female, presents to clinic with CC of continued chronic mouth pain and wishes to be excused from jury duty due to worsening oral sores and pain that she is waiting to see the specialist next month at the same time as jury duty.    Patient Active Problem List   Diagnosis Date Noted  . Lichen planus 76/80/8811  . Varicose vein of leg 12/17/2017  . Retinopathy due to secondary diabetes mellitus, without macular edema, with mild nonproliferative retinopathy ([place]) 09/17/2017  . Stress at home 01/09/2017  . Allergic rhinitis 01/04/2016  . Leg swelling 05/25/2015  . Chest congestion 11/17/2014  . Vaginitis and vulvovaginitis 11/17/2014  . Hoarse voice quality 07/22/2014  . Osteoarthritis of right knee 12/31/2012  . Endometrial cancer ([place]) 08/25/2012  . Hot flashes due to surgical menopause 08/20/2012  . Neuralgia 02/26/2012  . HTN, goal below 130/80 06/29/2011  . Arm pain, left 06/28/2011  . Carotid artery bruit 05/15/2011  . Palpitations 05/15/2011  . OTHER DYSPHAGIA 12/08/2010  . CONSTIPATION, CHRONIC 12/01/2009  . Diabetes mellitus type 2 in obese ([place]) 03/31/2009  . Obesity (BMI 30-39.9) 01/24/2007  . Hyperlipidemia 09/25/2006  . Anxiety state 09/25/2006  . GLAUCOMA NOS 09/25/2006  . GERD 09/25/2006  . Chronic pain 09/25/2006     Prior to Admission medications   Medication Sig Start Date End Date Taking? Authorizing Provider  ACCU-CHEK FASTCLIX LANCETS MISC TEST FASTING BLOOD SUGAR TWICE PER DAY 05/07/18  Yes [place], Modena Nunnery, MD  ACCU-CHEK SMARTVIEW test strip TEST FASTING BLOOD SUGAR TWICE PER DAY 04/23/18  Yes [place], Modena Nunnery, MD  acitretin (SORIATANE) 25 MG capsule Take 25 mg by  mouth daily. 06/19/18  Yes [provider]  Alcohol Swabs (B-D SINGLE USE SWABS REGULAR) PADS USE TWICE DAILY 02/26/18  Yes [place], Modena Nunnery, MD  ALPRAZolam Duanne Moron) 1 MG tablet TAKE 1 TABLET(1 MG) BY MOUTH TWICE DAILY AS NEEDED 05/20/18  Yes [place], Modena Nunnery, MD  amLODipine (NORVASC) 10 MG tablet TAKE 1 TABLET EVERY MORNING 03/05/18  Yes [place], Modena Nunnery, MD  aspirin 325 MG tablet Take 325 mg by mouth every morning.    Yes [provider]  Blood Glucose Calibration (ACCU-CHEK SMARTVIEW CONTROL) LIQD USE AS DIRECTED 05/07/18  Yes [place], Modena Nunnery, MD  Blood Glucose Monitoring Suppl w/Device KIT TEST FASTING BLOOD SUGAR TWICE PER DAY DX; E11.9 04/17/17  Yes [place], Modena Nunnery, MD  Cholecalciferol (VITAMIN D-3) 1000 UNITS CAPS Take by mouth.   Yes [provider]  co-enzyme Q-10 30 MG capsule Take 1 capsule (30 mg total) by mouth 3 (three) times daily. 01/09/17  Yes [place], Modena Nunnery, MD  fluticasone [facility]) 50 MCG/ACT nasal spray USE 2 SPRAYS IN EACH NOSTRIL EVERY DAY 10/23/17  Yes [place], Modena Nunnery, MD  furosemide (LASIX) 20 MG tablet TAKE 1 TABLET  DAILY AS NEEDED. 01/29/18  Yes , Modena Nunnery, MD  HYDROcodone-acetaminophen (NORCO) 5-325 MG tablet Take 1 tablet by mouth every 6 (six) hours as needed for moderate pain. 07/10/18  Yes Susy Frizzle, MD  Lancet Devices (ADJUSTABLE LANCING DEVICE) MISC TEST FASTING BLOOD SUGAR TWICE  PER DAY DX; E11.9 04/17/17  Yes [place], Modena Nunnery, MD  losartan (COZAAR) 50 MG tablet TAKE 1 TABLET EVERY DAY 03/05/18  Yes [place], Modena Nunnery, MD  meclizine (ANTIVERT) 12.5 MG tablet Take 1 tablet (12.5 mg total) by mouth 3 (three) times daily as needed for dizziness. 11/05/17  Yes [place], Modena Nunnery, MD  metFORMIN (GLUCOPHAGE) 500 MG tablet Take 1 tablet (500 mg total) by mouth daily with breakfast. 03/14/18  Yes [place], Modena Nunnery, MD  Multiple Vitamins-Minerals (CENTRUM ADULTS PO) Take by mouth.   Yes [provider]  naproxen (NAPROSYN) 500 MG  tablet TAKE 1 TABLET TWICE DAILY WITH MEALS 05/07/18  Yes [place], Modena Nunnery, MD  omeprazole (PRILOSEC) 20 MG capsule TAKE 1 CAPSULE EVERY DAY 05/07/18  Yes [place], Modena Nunnery, MD  polyethylene glycol powder (GLYCOLAX/MIRALAX) powder MIX 1 CAPFUL (17GM) IN 8 OUNCES OF WATER AND DRINK EVERY DAY 07/04/17  Yes [place], Modena Nunnery, MD  rosuvastatin (CRESTOR) 10 MG tablet TAKE 1 TABLET AT BEDTIME 05/07/18  Yes [place], Modena Nunnery, MD  spironolactone (ALDACTONE) 25 MG tablet TAKE 1 TABLET EVERY MORNING 03/05/18  Yes [place], Modena Nunnery, MD  triamcinolone cream (KENALOG) 0.1 % APPLY TOPICALLY TO VULVA 2 TIMES DAILY 05/07/18  Yes [place], Modena Nunnery, MD  vitamin B-12 (CYANOCOBALAMIN) 1000 MCG tablet Take 1 tablet (1,000 mcg total) by mouth daily. 01/09/17  Yes [place], Modena Nunnery, MD     Allergies  Allergen Reactions  . Contrast Media [Iodinated Diagnostic Agents] Hives  . Lisinopril     REACTION: cough  . Buspar [Buspirone Hcl] Palpitations     Family History  Problem Relation Age of Onset  . Hypertension Father   . Diabetes Father   . Diabetes Mother   . Healthy Sister   . Heart disease Daughter   . Healthy Daughter   . Arrhythmia Daughter        Defibrillator   . Scoliosis Daughter      Social History   Socioeconomic History  . Marital status: Widowed    Spouse name: Not on file  . Number of children: Not on file  . Years of education: Not on file  . Highest education level: Not on file  Occupational History  . Not on file  Social Needs  . Financial resource strain: Not on file  . Food insecurity:    Worry: Not on file    Inability: Not on file  . Transportation needs:    Medical: Not on file    Non-medical: Not on file  Tobacco Use  . Smoking status: Never Smoker  . Smokeless tobacco: Never Used  Substance and Sexual Activity  . Alcohol use: No  . Drug use: No  . Sexual activity: Not Currently  Lifestyle  . Physical activity:    Days per week: Not on file    Minutes per session:  Not on file  . Stress: Not on file  Relationships  . Social connections:    Talks on phone: Not on file    Gets together: Not on file    Attends religious service: Not on file    Active member of club or organization: Not on file    Attends meetings of clubs or organizations: Not on file    Relationship status: Not on file  . Intimate partner violence:    Fear of current or ex partner: Not on file    Emotionally abused: Not on file    Physically abused: Not on file  Forced sexual activity: Not on file  Other Topics Concern  . Not on file  Social History Narrative   Lives with children     Review of Systems  Constitutional: Negative.   HENT: Negative.   Eyes: Negative.   Respiratory: Negative.   Cardiovascular: Negative.   Gastrointestinal: Negative.   Endocrine: Negative.   Genitourinary: Negative.   Musculoskeletal: Negative.   Skin: Negative.   Allergic/Immunologic: Negative.   Neurological: Negative.   Hematological: Negative.   Psychiatric/Behavioral: Negative.   All other systems reviewed and are negative.      Objective:    Vitals:   07/30/18 1053  BP: 134/74  Pulse: 82  Resp: 14  Temp: 98.6 F (37 C)  TempSrc: Oral  SpO2: 99%  Weight: 245 lb (111.1 kg)  Height: _0  (1.575 m)      Physical Exam  Constitutional: She appears well-developed.  HENT:  Head: Normocephalic and atraumatic.  Nose: Nose normal.  Mouth/Throat: Oral lesions present. No tonsillar exudate.  Oral lesions to bilateral buccal mucosa with swelling, redness and ttp  Eyes: Conjunctivae are normal. Right eye exhibits no discharge. Left eye exhibits no discharge.  Neck: No tracheal deviation present.  Cardiovascular: Normal rate and regular rhythm.  Pulmonary/Chest: Effort normal. No stridor. No respiratory distress.  Musculoskeletal: Normal range of motion.  Neurological: She is alert. She exhibits normal muscle tone. Coordination normal.  Skin: Skin is warm and dry. No  rash noted.  Psychiatric: She has a normal mood and affect. Her behavior is normal.  Nursing note and vitals reviewed.         Assessment & Plan:      ICD-10-CM   1. Oral mucosal lesion K13.70    worsening - pending appt with specialist at [place] given    Letter provided for pt to be excused from [place] duty with worsening intraoral condition pending specialist evaluation, do not feel she is in well enough condition to serve at this time with swelling, bleeding and severe pain.  Letter given.    Delsa Grana, PA-C 07/30/18 11:30 AM

## 2018-08-06 ENCOUNTER — Telehealth: Payer: Self-pay | Admitting: *Deleted

## 2018-08-06 NOTE — Telephone Encounter (Signed)
Ok with linzess 145 mcg poqday

## 2018-08-06 NOTE — Telephone Encounter (Signed)
Received call from patient.   Reports that she has chronic constipation and is supposed to be using Miralax as needed. States that she has abd pain and cramping after using the Miralax, so she has stopped taking it.   Requested prescription for Linzess to be sent to pharmacy.   MD please advise.

## 2018-08-07 MED ORDER — LINACLOTIDE 145 MCG PO CAPS: 145.0000 ug | ORAL_CAPSULE | Freq: Every day | ORAL | 3 refills | Status: DC

## 2018-08-07 NOTE — Telephone Encounter (Signed)
Prescription sent to pharmacy.

## 2018-08-22 DIAGNOSIS — E11319 Type 2 diabetes mellitus with unspecified diabetic retinopathy without macular edema: Secondary | ICD-10-CM | POA: Diagnosis not present

## 2018-08-22 DIAGNOSIS — E119 Type 2 diabetes mellitus without complications: Secondary | ICD-10-CM | POA: Diagnosis not present

## 2018-08-22 DIAGNOSIS — H52229 Regular astigmatism, unspecified eye: Secondary | ICD-10-CM | POA: Diagnosis not present

## 2018-08-22 DIAGNOSIS — E113293 Type 2 diabetes mellitus with mild nonproliferative diabetic retinopathy without macular edema, bilateral: Secondary | ICD-10-CM | POA: Diagnosis not present

## 2018-08-31 ENCOUNTER — Encounter: Payer: Self-pay | Admitting: *Deleted

## 2018-09-10 DIAGNOSIS — L438 Other lichen planus: Secondary | ICD-10-CM | POA: Diagnosis not present

## 2018-09-10 DIAGNOSIS — Z5181 Encounter for therapeutic drug level monitoring: Secondary | ICD-10-CM | POA: Diagnosis not present

## 2018-09-10 DIAGNOSIS — L439 Lichen planus, unspecified: Secondary | ICD-10-CM | POA: Diagnosis not present

## 2018-09-17 ENCOUNTER — Other Ambulatory Visit: Payer: Self-pay | Admitting: Family Medicine

## 2018-09-20 ENCOUNTER — Ambulatory Visit (INDEPENDENT_AMBULATORY_CARE_PROVIDER_SITE_OTHER): Payer: Medicare HMO | Admitting: Family Medicine

## 2018-09-20 ENCOUNTER — Ambulatory Visit: Payer: Medicare HMO | Admitting: Family Medicine

## 2018-09-20 ENCOUNTER — Encounter: Payer: Self-pay | Admitting: Family Medicine

## 2018-09-20 ENCOUNTER — Other Ambulatory Visit: Payer: Self-pay

## 2018-09-20 VITALS — BP 130/68 | HR 98 | Temp 98.1°F | Resp 14 | Ht 62.0 in | Wt 237.0 lb

## 2018-09-20 DIAGNOSIS — I10 Essential (primary) hypertension: Secondary | ICD-10-CM | POA: Diagnosis not present

## 2018-09-20 DIAGNOSIS — E669 Obesity, unspecified: Secondary | ICD-10-CM

## 2018-09-20 DIAGNOSIS — G894 Chronic pain syndrome: Secondary | ICD-10-CM

## 2018-09-20 DIAGNOSIS — E7800 Pure hypercholesterolemia, unspecified: Secondary | ICD-10-CM

## 2018-09-20 DIAGNOSIS — F411 Generalized anxiety disorder: Secondary | ICD-10-CM

## 2018-09-20 DIAGNOSIS — E1169 Type 2 diabetes mellitus with other specified complication: Secondary | ICD-10-CM | POA: Diagnosis not present

## 2018-09-20 MED ORDER — OMEPRAZOLE 20 MG PO CPDR: 20.0000 mg | DELAYED_RELEASE_CAPSULE | Freq: Every day | ORAL | 3 refills | Status: DC

## 2018-09-20 MED ORDER — ROSUVASTATIN CALCIUM 10 MG PO TABS: 10.0000 mg | ORAL_TABLET | Freq: Every day | ORAL | 3 refills | Status: DC

## 2018-09-20 MED ORDER — ALPRAZOLAM 1 MG PO TABS: ORAL_TABLET | ORAL | 2 refills | Status: DC

## 2018-09-20 MED ORDER — FUROSEMIDE 20 MG PO TABS: 20.0000 mg | ORAL_TABLET | Freq: Every day | ORAL | 3 refills | Status: DC | PRN

## 2018-09-20 MED ORDER — AMLODIPINE BESYLATE 10 MG PO TABS: 10.0000 mg | ORAL_TABLET | Freq: Every morning | ORAL | 3 refills | Status: DC

## 2018-09-20 MED ORDER — LOSARTAN POTASSIUM 50 MG PO TABS: 50.0000 mg | ORAL_TABLET | Freq: Every day | ORAL | 3 refills | Status: DC

## 2018-09-20 MED ORDER — SPIRONOLACTONE 25 MG PO TABS: 25.0000 mg | ORAL_TABLET | Freq: Every morning | ORAL | 3 refills | Status: DC

## 2018-09-20 MED ORDER — POLYETHYLENE GLYCOL 3350 17 GM/SCOOP PO POWD: ORAL | 3 refills | Status: DC

## 2018-09-20 MED ORDER — LINACLOTIDE 145 MCG PO CAPS: 145.0000 ug | ORAL_CAPSULE | Freq: Every day | ORAL | 3 refills | Status: DC

## 2018-09-20 MED ORDER — METFORMIN HCL 500 MG PO TABS: 500.0000 mg | ORAL_TABLET | Freq: Every day | ORAL | 3 refills | Status: DC

## 2018-09-20 MED ORDER — HYDROCODONE-ACETAMINOPHEN 5-325 MG PO TABS: 1.0000 | ORAL_TABLET | Freq: Four times a day (QID) | ORAL | 0 refills | Status: DC | PRN

## 2018-09-20 MED ORDER — FLUTICASONE PROPIONATE 50 MCG/ACT NA SUSP: 2.0000 | Freq: Every day | NASAL | 3 refills | Status: DC

## 2018-09-20 NOTE — Assessment & Plan Note (Signed)
Controlled with low dose metformin, no hypoglycemia reported Recheck today  Some weight loss from poor nutrition

## 2018-09-20 NOTE — Assessment & Plan Note (Signed)
Lipids at goal, continue crestor

## 2018-09-20 NOTE — Addendum Note (Signed)
Addended by: Vic Blackbird F on: 09/20/2018 12:24 PM   Modules accepted: Orders

## 2018-09-20 NOTE — Assessment & Plan Note (Addendum)
Refilled norco Also with oral pain from lichen planus Plus her chronic joint pain

## 2018-09-20 NOTE — Assessment & Plan Note (Signed)
At goal no changes. 

## 2018-09-20 NOTE — Assessment & Plan Note (Signed)
Continue prn xanax

## 2018-09-20 NOTE — Progress Notes (Signed)
Subjective:    Patient ID: [name] [name], female    DOB: Feb 22, 1956, 62 y.o.   MRN: [id number]  Patient presents for Follow-up (is fasting)    Pt here to f/u chronic medical problems  Medications reviewed       Seen by new dermatologist at [facility] she has lichen  Planus and oral Lichen lesions - now on Prograf and Methotrexate, steroid creams  She was having severe pain in her mouth and nose, she gets bleeding from the areas. Was taking her pain meds to help also on lidocaine swish   Losing weight  Due to change in diet with oral lesions     Anxiety/insomnia- taking her xanax , needs it be refilled   Flu shot done     Mammogram- was normal in Oct 2019   Chronic pain needs refill on pain medicine    DM- last  A1C 7.2%, in September , currently on metformin 500ng with breakfast . Did not bring her meter    Normal cholesterol in May LDL  60 Seen by [facility], Dr. Jorja Loa   HTN- taking BP meds as prescribed no concerns        Review Of Systems:  GEN- denies fatigue, fever, weight loss,weakness, recent illness HEENT- denies eye drainage, change in vision, nasal discharge, CVS- denies chest pain, palpitations RESP- denies SOB, cough, wheeze ABD- denies N/V, change in stools, abd pain GU- denies dysuria, hematuria, dribbling, incontinence MSK- +joint pain, muscle aches, injury Neuro- denies headache, dizziness, syncope, seizure activity            Objective:    BP 130/68   Pulse 98   Temp 98.1 F (36.7 C) (Oral)   Resp 14   Ht 5\' 2"  (1.575 m)   Wt 237 lb (107.5 kg)   LMP 06/03/2012   SpO2 97%   BMI 43.35 kg/m  GEN- NAD, alert and oriented x3 HEENT- PERRL, EOMI, non injected sclera, pink conjunctiva, MMM, oropharynx hypogpiemented erythematous plaque like lesions in mouth along buccal mucosa, post oropharynx clear  Neck- Supple, no thyromegaly, s CVS- RRR, no murmur RESP-CTAB ABD-NABS,soft,NT,ND Psych-normal affect and mood Skin hyperpigmented  plaque-like lesions on arms abdomen legs, hands EXT- No edema Pulses- Radial, DP- 2+        Assessment & Plan:      Problem List Items Addressed This Visit      Unprioritized   Anxiety state    Continue prn xanax      Relevant Medications   ALPRAZolam (XANAX) 1 MG tablet   Chronic pain    Refilled norco Also with oral pain from lichen planus Plus her chronic joint pain      Relevant Medications   HYDROcodone-acetaminophen (NORCO) 5-325 MG tablet   Diabetes mellitus type 2 in obese (HCC) - Primary    Controlled with low dose metformin, no hypoglycemia reported Recheck today  Some weight loss from poor nutrition      Relevant Medications   rosuvastatin (CRESTOR) 10 MG tablet   losartan (COZAAR) 50 MG tablet   metFORMIN (GLUCOPHAGE) 500 MG tablet   Other Relevant Orders   Basic metabolic panel   Hemoglobin A1c   HTN, goal below 130/80    At goal no changes      Relevant Medications   spironolactone (ALDACTONE) 25 MG tablet   rosuvastatin (CRESTOR) 10 MG tablet   losartan (COZAAR) 50 MG tablet   furosemide (LASIX) 20 MG tablet   amLODipine (NORVASC) 10 MG  tablet   Hyperlipidemia    Lipids at goal, continue crestor       Relevant Medications   spironolactone (ALDACTONE) 25 MG tablet   rosuvastatin (CRESTOR) 10 MG tablet   losartan (COZAAR) 50 MG tablet   furosemide (LASIX) 20 MG tablet   amLODipine (NORVASC) 10 MG tablet   Obesity (BMI 30-39.9)   Relevant Medications   metFORMIN (GLUCOPHAGE) 500 MG tablet      Note: This dictation was prepared with Dragon dictation along with smaller phrase technology. Any transcriptional errors that result from this process are unintentional.    Neck- Supple, no thyromegaly CVS- RRR, no murmur RESP-CTAB ABD-NABS,soft,NT,ND EXT- No edema Pulses- Radial, DP- 2+        Assessment & Plan:      Problem List Items Addressed This Visit      Unprioritized   Anxiety state    Continue prn xanax      Relevant  Medications   ALPRAZolam (XANAX) 1 MG tablet   Chronic pain    Refilled norco Also with oral pain from lichen planus Plus her chronic joint pain      Relevant Medications   HYDROcodone-acetaminophen (NORCO) 5-325 MG tablet   Diabetes mellitus type 2 in obese (HCC) - Primary    Controlled with low dose metformin, no hypoglycemia reported Recheck today  Some weight loss from poor nutrition      Relevant Medications   rosuvastatin (CRESTOR) 10 MG tablet   losartan (COZAAR) 50 MG tablet   metFORMIN (GLUCOPHAGE) 500 MG tablet   Other Relevant Orders   Basic metabolic panel   Hemoglobin A1c   HTN, goal below 130/80    At goal no changes      Relevant Medications   spironolactone (ALDACTONE) 25 MG tablet   rosuvastatin (CRESTOR) 10 MG tablet   losartan (COZAAR) 50 MG tablet   furosemide (LASIX) 20 MG tablet   amLODipine (NORVASC) 10 MG tablet   Hyperlipidemia    Lipids at goal, continue crestor       Relevant Medications   spironolactone (ALDACTONE) 25 MG tablet   rosuvastatin (CRESTOR) 10 MG tablet   losartan (COZAAR) 50 MG tablet   furosemide (LASIX) 20 MG tablet   amLODipine (NORVASC) 10 MG tablet   Obesity (BMI 30-39.9)   Relevant Medications   metFORMIN (GLUCOPHAGE) 500 MG tablet      Note: This dictation was prepared with Dragon dictation along with smaller phrase technology. Any transcriptional errors that result from this process are unintentional.

## 2018-09-21 LAB — BASIC METABOLIC PANEL
BUN: 19 mg/dL (ref 7–25)
CHLORIDE: 100 mmol/L (ref 98–110)
CO2: 30 mmol/L (ref 20–32)
Calcium: 9.3 mg/dL (ref 8.6–10.4)
Creat: 0.89 mg/dL (ref 0.50–0.99)
Glucose, Bld: 119 mg/dL — ABNORMAL HIGH (ref 65–99)
Potassium: 4.5 mmol/L (ref 3.5–5.3)
SODIUM: 138 mmol/L (ref 135–146)

## 2018-09-21 LAB — HEMOGLOBIN A1C
Hgb A1c MFr Bld: 7.4 % of total Hgb — ABNORMAL HIGH (ref <5.7)
Mean Plasma Glucose: 166 (calc)
eAG (mmol/L): 9.2 (calc)

## 2018-09-25 ENCOUNTER — Telehealth: Payer: Self-pay | Admitting: *Deleted

## 2018-09-25 NOTE — Telephone Encounter (Signed)
Okay to fill the medications, though prograf prescribed by someone else , so do not refill this under my name. She needs to have a BMET in 6 weeks, make sure her potassium is good

## 2018-09-25 NOTE — Telephone Encounter (Signed)
Received fax from pharmacy requesting clarification on medications.   States that patient is currently taking Aldactone and Prograf. Reports increased risk if hyperkalemia while taking both medications.   Ok to fill both?

## 2018-09-26 ENCOUNTER — Other Ambulatory Visit: Payer: Self-pay | Admitting: *Deleted

## 2018-09-26 DIAGNOSIS — I10 Essential (primary) hypertension: Secondary | ICD-10-CM

## 2018-09-26 DIAGNOSIS — Z79899 Other long term (current) drug therapy: Secondary | ICD-10-CM

## 2018-09-26 MED ORDER — METFORMIN HCL 500 MG PO TABS: 500.0000 mg | ORAL_TABLET | Freq: Two times a day (BID) | ORAL | 3 refills | Status: DC

## 2018-09-26 NOTE — Telephone Encounter (Signed)
Call placed to patient and patient made aware.  

## 2018-09-30 ENCOUNTER — Other Ambulatory Visit: Payer: Self-pay

## 2018-09-30 ENCOUNTER — Emergency Department (HOSPITAL_COMMUNITY)
Admission: EM | Admit: 2018-09-30 | Discharge: 2018-09-30 | Disposition: A | Discharge status: Home / Self Care | Payer: No Typology Code available for payment source | Attending: Emergency Medicine | Admitting: Emergency Medicine

## 2018-09-30 ENCOUNTER — Encounter (HOSPITAL_COMMUNITY): Payer: Self-pay | Admitting: Emergency Medicine

## 2018-09-30 ENCOUNTER — Emergency Department (HOSPITAL_COMMUNITY): Payer: No Typology Code available for payment source

## 2018-09-30 DIAGNOSIS — R0789 Other chest pain: Secondary | ICD-10-CM | POA: Diagnosis not present

## 2018-09-30 DIAGNOSIS — S199XXA Unspecified injury of neck, initial encounter: Secondary | ICD-10-CM | POA: Diagnosis not present

## 2018-09-30 DIAGNOSIS — Y999 Unspecified external cause status: Secondary | ICD-10-CM | POA: Diagnosis not present

## 2018-09-30 DIAGNOSIS — Y92410 Unspecified street and highway as the place of occurrence of the external cause: Secondary | ICD-10-CM | POA: Diagnosis not present

## 2018-09-30 DIAGNOSIS — I10 Essential (primary) hypertension: Secondary | ICD-10-CM | POA: Insufficient documentation

## 2018-09-30 DIAGNOSIS — M79672 Pain in left foot: Secondary | ICD-10-CM | POA: Insufficient documentation

## 2018-09-30 DIAGNOSIS — E119 Type 2 diabetes mellitus without complications: Secondary | ICD-10-CM | POA: Insufficient documentation

## 2018-09-30 DIAGNOSIS — S299XXA Unspecified injury of thorax, initial encounter: Secondary | ICD-10-CM | POA: Diagnosis not present

## 2018-09-30 DIAGNOSIS — R42 Dizziness and giddiness: Secondary | ICD-10-CM | POA: Diagnosis not present

## 2018-09-30 DIAGNOSIS — S0990XA Unspecified injury of head, initial encounter: Secondary | ICD-10-CM | POA: Diagnosis not present

## 2018-09-30 DIAGNOSIS — S99922A Unspecified injury of left foot, initial encounter: Secondary | ICD-10-CM | POA: Diagnosis not present

## 2018-09-30 DIAGNOSIS — Y9389 Activity, other specified: Secondary | ICD-10-CM | POA: Insufficient documentation

## 2018-09-30 DIAGNOSIS — R0781 Pleurodynia: Secondary | ICD-10-CM | POA: Diagnosis not present

## 2018-09-30 DIAGNOSIS — S161XXA Strain of muscle, fascia and tendon at neck level, initial encounter: Secondary | ICD-10-CM

## 2018-09-30 MED ORDER — METHOCARBAMOL 500 MG PO TABS: 500.0000 mg | ORAL_TABLET | Freq: Three times a day (TID) | ORAL | 0 refills | Status: DC | PRN
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
## 2018-10-14 DIAGNOSIS — L439 Lichen planus, unspecified: Secondary | ICD-10-CM | POA: Diagnosis not present

## 2018-10-14 DIAGNOSIS — Z79899 Other long term (current) drug therapy: Secondary | ICD-10-CM | POA: Diagnosis not present

## 2018-10-14 DIAGNOSIS — L438 Other lichen planus: Secondary | ICD-10-CM | POA: Diagnosis not present

## 2018-10-14 DIAGNOSIS — Z5181 Encounter for therapeutic drug level monitoring: Secondary | ICD-10-CM | POA: Diagnosis not present

## 2018-10-23 ENCOUNTER — Encounter: Payer: Self-pay | Admitting: Family Medicine

## 2018-10-23 ENCOUNTER — Ambulatory Visit (INDEPENDENT_AMBULATORY_CARE_PROVIDER_SITE_OTHER): Payer: Medicare HMO | Admitting: Family Medicine

## 2018-10-23 ENCOUNTER — Other Ambulatory Visit: Payer: Self-pay

## 2018-10-23 ENCOUNTER — Ambulatory Visit: Payer: Medicare HMO | Admitting: Family Medicine

## 2018-10-23 VITALS — BP 132/64 | HR 72 | Temp 98.2°F | Resp 14 | Ht 62.0 in | Wt 231.0 lb

## 2018-10-23 DIAGNOSIS — M545 Low back pain, unspecified: Secondary | ICD-10-CM

## 2018-10-23 DIAGNOSIS — H43393 Other vitreous opacities, bilateral: Secondary | ICD-10-CM

## 2018-10-23 DIAGNOSIS — H539 Unspecified visual disturbance: Secondary | ICD-10-CM | POA: Diagnosis not present

## 2018-10-23 MED ORDER — METHOCARBAMOL 500 MG PO TABS: 500.0000 mg | ORAL_TABLET | Freq: Four times a day (QID) | ORAL | 1 refills | Status: AC | PRN
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
## 2018-10-23 NOTE — Patient Instructions (Addendum)
Get xray of your low back  Take muscle relaxer, pain medication Referral to eye doctor F/U 4 weeks for recheck

## 2018-10-24 DIAGNOSIS — H524 Presbyopia: Secondary | ICD-10-CM | POA: Diagnosis not present

## 2018-10-24 DIAGNOSIS — H52223 Regular astigmatism, bilateral: Secondary | ICD-10-CM | POA: Diagnosis not present

## 2018-10-24 DIAGNOSIS — E119 Type 2 diabetes mellitus without complications: Secondary | ICD-10-CM | POA: Diagnosis not present

## 2018-10-24 DIAGNOSIS — E11311 Type 2 diabetes mellitus with unspecified diabetic retinopathy with macular edema: Secondary | ICD-10-CM | POA: Diagnosis not present

## 2018-10-24 LAB — HM DIABETES EYE EXAM

## 2018-10-28 ENCOUNTER — Ambulatory Visit (HOSPITAL_COMMUNITY)
Admission: RE | Admit: 2018-10-28 | Discharge: 2018-10-28 | Disposition: A | Discharge status: Home / Self Care | Payer: Medicare HMO | Source: Ambulatory Visit | Attending: Family Medicine | Admitting: Family Medicine

## 2018-10-28 DIAGNOSIS — M545 Low back pain, unspecified: Secondary | ICD-10-CM

## 2018-10-29 ENCOUNTER — Encounter: Payer: Self-pay | Admitting: *Deleted

## 2018-11-04 ENCOUNTER — Other Ambulatory Visit: Payer: Medicare HMO

## 2018-11-04 DIAGNOSIS — Z79899 Other long term (current) drug therapy: Secondary | ICD-10-CM

## 2018-11-04 DIAGNOSIS — I10 Essential (primary) hypertension: Secondary | ICD-10-CM | POA: Diagnosis not present

## 2018-11-04 LAB — BASIC METABOLIC PANEL
BUN: 12 mg/dL (ref 7–25)
CALCIUM: 9.3 mg/dL (ref 8.6–10.4)
CO2: 32 mmol/L (ref 20–32)
Chloride: 101 mmol/L (ref 98–110)
Creat: 0.89 mg/dL (ref 0.50–0.99)
Glucose, Bld: 112 mg/dL — ABNORMAL HIGH (ref 65–99)
Potassium: 4.2 mmol/L (ref 3.5–5.3)
Sodium: 140 mmol/L (ref 135–146)

## 2018-11-07 ENCOUNTER — Encounter: Payer: Self-pay | Admitting: *Deleted

## 2018-11-22 ENCOUNTER — Ambulatory Visit (INDEPENDENT_AMBULATORY_CARE_PROVIDER_SITE_OTHER): Payer: Medicare HMO | Admitting: Family Medicine

## 2018-11-22 ENCOUNTER — Other Ambulatory Visit: Payer: Self-pay | Admitting: Family Medicine

## 2018-11-22 ENCOUNTER — Other Ambulatory Visit: Payer: Self-pay

## 2018-11-22 ENCOUNTER — Encounter: Payer: Self-pay | Admitting: Family Medicine

## 2018-11-22 VITALS — BP 134/76 | HR 70 | Temp 98.1°F | Resp 12 | Ht 62.0 in | Wt 230.0 lb

## 2018-11-22 DIAGNOSIS — H43393 Other vitreous opacities, bilateral: Secondary | ICD-10-CM

## 2018-11-22 DIAGNOSIS — J209 Acute bronchitis, unspecified: Secondary | ICD-10-CM

## 2018-11-22 DIAGNOSIS — M5136 Other intervertebral disc degeneration, lumbar region: Secondary | ICD-10-CM

## 2018-11-22 DIAGNOSIS — M545 Low back pain, unspecified: Secondary | ICD-10-CM

## 2018-11-22 DIAGNOSIS — M51369 Other intervertebral disc degeneration, lumbar region without mention of lumbar back pain or lower extremity pain: Secondary | ICD-10-CM | POA: Insufficient documentation

## 2018-11-22 DIAGNOSIS — J0100 Acute maxillary sinusitis, unspecified: Secondary | ICD-10-CM | POA: Diagnosis not present

## 2018-11-22 MED ORDER — PREDNISONE 20 MG PO TABS: 40.0000 mg | ORAL_TABLET | Freq: Every day | ORAL | 0 refills | Status: DC

## 2018-11-22 MED ORDER — ALBUTEROL SULFATE HFA 108 (90 BASE) MCG/ACT IN AERS: 2.0000 | INHALATION_SPRAY | RESPIRATORY_TRACT | 0 refills | Status: DC | PRN

## 2018-11-22 MED ORDER — ALBUTEROL SULFATE (2.5 MG/3ML) 0.083% IN NEBU
2.5000 mg | INHALATION_SOLUTION | Freq: Once | RESPIRATORY_TRACT | Status: AC
  Administered 2018-11-22: 2.5 mg via RESPIRATORY_TRACT

## 2018-11-22 MED ORDER — AMOXICILLIN 500 MG PO CAPS: 500.0000 mg | ORAL_CAPSULE | Freq: Two times a day (BID) | ORAL | 0 refills | Status: DC

## 2018-11-22 NOTE — Addendum Note (Signed)
Addended by: Sheral Flow on: 11/22/2018 04:19 PM   Modules accepted: Orders

## 2018-11-22 NOTE — Assessment & Plan Note (Signed)
Referral for physical therapy Advised not to overtake NSAIDS Will actually hold while on steroids for bronchitis Muscle relaxer as needed Heating pad
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
## 2018-11-22 NOTE — Patient Instructions (Addendum)
Referral to physical therapy Take Mucinex DM or robitussin DM Steroids Antibiotics Use albuterol inhaler  F/U after therapy completed in 8 weeks

## 2018-12-05 ENCOUNTER — Telehealth (HOSPITAL_COMMUNITY): Payer: Self-pay | Admitting: Family Medicine

## 2018-12-05 NOTE — Telephone Encounter (Signed)
12/05/18  pt left a message to cx but I called her back and left a message to see what she wanted to cancel

## 2018-12-06 ENCOUNTER — Ambulatory Visit (HOSPITAL_COMMUNITY): Payer: Medicare HMO | Admitting: Physical Therapy

## 2018-12-10 ENCOUNTER — Encounter (HOSPITAL_COMMUNITY): Payer: Self-pay | Admitting: Physical Therapy

## 2018-12-10 ENCOUNTER — Other Ambulatory Visit: Payer: Self-pay

## 2018-12-10 ENCOUNTER — Ambulatory Visit (HOSPITAL_COMMUNITY): Payer: Medicare HMO | Attending: Family Medicine | Admitting: Physical Therapy

## 2018-12-10 DIAGNOSIS — R29898 Other symptoms and signs involving the musculoskeletal system: Secondary | ICD-10-CM | POA: Diagnosis not present

## 2018-12-10 DIAGNOSIS — M6281 Muscle weakness (generalized): Secondary | ICD-10-CM | POA: Insufficient documentation

## 2018-12-10 DIAGNOSIS — M545 Low back pain, unspecified: Secondary | ICD-10-CM
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
## 2018-12-10 NOTE — Patient Instructions (Signed)
Double Knee to Chest (Flexion)    Gently pull both knees toward chest. Feel stretch in lower back or buttock area. Breathing deeply, Hold __20__ seconds. Repeat __3__ times. Do __1-2__ sessions per day.  http://gt2.exer.us/228   Copyright  VHI. All rights reserved.   

## 2018-12-12 ENCOUNTER — Ambulatory Visit (INDEPENDENT_AMBULATORY_CARE_PROVIDER_SITE_OTHER): Payer: Medicare HMO | Admitting: Otolaryngology

## 2018-12-12 DIAGNOSIS — K219 Gastro-esophageal reflux disease without esophagitis: Secondary | ICD-10-CM | POA: Diagnosis not present

## 2018-12-12 DIAGNOSIS — R490 Dysphonia: Secondary | ICD-10-CM

## 2018-12-13 ENCOUNTER — Ambulatory Visit (HOSPITAL_COMMUNITY): Payer: Medicare HMO | Admitting: Physical Therapy

## 2018-12-13 ENCOUNTER — Telehealth (HOSPITAL_COMMUNITY): Payer: Self-pay

## 2018-12-13 ENCOUNTER — Encounter (HOSPITAL_COMMUNITY): Payer: Self-pay | Admitting: Physical Therapy

## 2018-12-13 DIAGNOSIS — M545 Low back pain, unspecified: Secondary | ICD-10-CM

## 2018-12-13 DIAGNOSIS — M6281 Muscle weakness (generalized): Secondary | ICD-10-CM

## 2018-12-13 DIAGNOSIS — R29898 Other symptoms and signs involving the musculoskeletal system: Secondary | ICD-10-CM

## 2018-12-13 NOTE — Patient Instructions (Signed)
Abdominal set: Laying on back with knees bent, tighten stomach muscles, hold for 3 seconds and then relax. Repeat 10 times.   Bridge    Lie back, legs bent. Inhale, pressing hips up. Keeping ribs in, lengthen lower back. Exhale, rolling down along spine from top. Hold for 2 seconds. Repeat __10__ times. Do __1__ sessions per day.  http://pm.exer.us/55   Copyright  VHI. All rights reserved

## 2018-12-13 NOTE — Telephone Encounter (Signed)
Called pt to answer some questions so I could submitte approval request to [facility] on line. Completed and submitted on 12/13/2018. NF
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
## 2018-12-18 ENCOUNTER — Ambulatory Visit (HOSPITAL_COMMUNITY): Payer: Medicare HMO | Attending: Family Medicine

## 2018-12-18 ENCOUNTER — Encounter (HOSPITAL_COMMUNITY): Payer: Self-pay

## 2018-12-18 DIAGNOSIS — M545 Low back pain, unspecified: Secondary | ICD-10-CM

## 2018-12-18 DIAGNOSIS — R29898 Other symptoms and signs involving the musculoskeletal system: Secondary | ICD-10-CM | POA: Diagnosis not present

## 2018-12-18 DIAGNOSIS — M6281 Muscle weakness (generalized): Secondary | ICD-10-CM | POA: Diagnosis not present

## 2018-12-18 NOTE — Patient Instructions (Signed)
Bracing With Leg March (Hook-Lying)    With neutral spine, tighten pelvic floor and abdominals and hold. Alternating legs, lift foot ___ inches and return to floor. Repeat 10 times. Do2 times a day.   Copyright  VHI. All rights reserved.   Posture - Sitting    Sit upright, head facing forward. Try using a roll to support lower back. Keep shoulders relaxed, and avoid rounded back. Keep hips level with knees. Avoid crossing legs for long periods.   Copyright  VHI. All rights reserved.
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
## 2018-12-19 ENCOUNTER — Other Ambulatory Visit: Payer: Self-pay | Admitting: *Deleted

## 2018-12-19 ENCOUNTER — Telehealth: Payer: Self-pay | Admitting: *Deleted

## 2018-12-19 MED ORDER — METFORMIN HCL 500 MG PO TABS: 500.0000 mg | ORAL_TABLET | Freq: Two times a day (BID) | ORAL | 3 refills | Status: DC

## 2018-12-19 NOTE — Telephone Encounter (Signed)
Received call from patient.   States that she has completed all ABTx from sinusitis, but continues tohave congestion in both her head and chest. Reports that she is taking Mucinex and cough syrup to get congestion up,. But it is only slightly effective. Reports that mucus is yellow tinged.   Requested MD to advise.

## 2018-12-20 ENCOUNTER — Encounter (HOSPITAL_COMMUNITY): Payer: Self-pay | Admitting: Physical Therapy

## 2018-12-20 ENCOUNTER — Ambulatory Visit (HOSPITAL_COMMUNITY): Payer: Medicare HMO | Admitting: Physical Therapy

## 2018-12-20 DIAGNOSIS — M6281 Muscle weakness (generalized): Secondary | ICD-10-CM | POA: Diagnosis not present

## 2018-12-20 DIAGNOSIS — M545 Low back pain, unspecified: Secondary | ICD-10-CM

## 2018-12-20 DIAGNOSIS — R29898 Other symptoms and signs involving the musculoskeletal system: Secondary | ICD-10-CM | POA: Diagnosis not present

## 2018-12-20 MED ORDER — AMOXICILLIN-POT CLAVULANATE 875-125 MG PO TABS: 1.0000 | ORAL_TABLET | Freq: Two times a day (BID) | ORAL | 0 refills | Status: AC

## 2018-12-20 MED ORDER — ALBUTEROL SULFATE HFA 108 (90 BASE) MCG/ACT IN AERS: INHALATION_SPRAY | RESPIRATORY_TRACT | 0 refills | Status: DC

## 2018-12-20 NOTE — Patient Instructions (Signed)
Low Back Stretch: One leg (Supine)    Lying on back, bring one knee toward chest by pulling gently behind knee. Hold _30___ seconds. Repeat 2x. Repeat with other leg.  Copyright  VHI. All rights reserved.  Knee Roll    Lying on back, with knees bent and feet flat on floor, arms outstretched to sides, slowly roll both knees to side, hold 5 seconds. Back to starting position, hold 5 seconds. Then to opposite side, hold 5 seconds. Return to starting position. Keep shoulders and arms in contact with floor. Repeat 5x on each side.   Copyright  VHI. All rights reserved.

## 2018-12-20 NOTE — Telephone Encounter (Signed)
Give Augmentin  875-125mg  BID x 10 days , continue mucinex Use nasal spray flonase or nasal saline

## 2018-12-20 NOTE — Telephone Encounter (Signed)
Call placed to patient and patient made aware.   Also requested refill on Albuterol. Prescription sent to pharmacy.   Advised if no improvement noted after ABTx x2, OV will be required.

## 2018-12-20 NOTE — Addendum Note (Signed)
Addended by: Sheral Flow on: 12/20/2018 10:58 AM   Modules accepted: Orders
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
## 2018-12-23 ENCOUNTER — Telehealth (HOSPITAL_COMMUNITY): Payer: Self-pay | Admitting: Family Medicine

## 2018-12-23 NOTE — Telephone Encounter (Signed)
12/23/18  Pt called to cx said that she had a funeral to go too.

## 2018-12-24 ENCOUNTER — Ambulatory Visit (HOSPITAL_COMMUNITY): Payer: Medicare HMO | Admitting: Physical Therapy

## 2018-12-26 ENCOUNTER — Ambulatory Visit (HOSPITAL_COMMUNITY): Payer: Medicare HMO

## 2018-12-26 ENCOUNTER — Encounter (HOSPITAL_COMMUNITY): Payer: Self-pay

## 2018-12-26 ENCOUNTER — Other Ambulatory Visit: Payer: Self-pay

## 2018-12-26 DIAGNOSIS — M545 Low back pain, unspecified: Secondary | ICD-10-CM

## 2018-12-26 DIAGNOSIS — R29898 Other symptoms and signs involving the musculoskeletal system: Secondary | ICD-10-CM | POA: Diagnosis not present

## 2018-12-26 DIAGNOSIS — M6281 Muscle weakness (generalized): Secondary | ICD-10-CM | POA: Diagnosis not present

## 2018-12-26 NOTE — Therapy (Signed)
[place], [place], [address] Phone: [phone]   Fax:  [phone]  Physical Therapy Treatment  Patient Details  Name: [name] [name] MRN: [id number] Date of Birth: November 27, 1955 Referring Provider (PT): Buelah Manis, Modena Nunnery, MD   Encounter Date: 12/26/2018  PT End of Session - 12/26/18 1406    Visit Number  5    Number of Visits  12    Date for PT Re-Evaluation  01/21/19   Minireassess 12/31/18   Authorization Type  [employer] H[employer] visits based on medical necessity - auth required  (Print progress notes also)    Authorization Time Period  12/10/18 - 01/21/19    Authorization - Visit Number  5    Authorization - Number of Visits  10    PT Start Time  5188    PT Stop Time  0940    PT Time Calculation (min)  42 min    Activity Tolerance  Patient tolerated treatment well;No increased pain    Behavior During Therapy  WFL for tasks assessed/performed       Past Medical History:  Diagnosis Date  . Anxiety   . Cancer ([place])   . Chronic abdominal pain   . Constipation   . Depression   . Diabetes mellitus, type 2 ([place])   . Essential hypertension, benign   . GERD (gastroesophageal reflux disease)   . Glaucoma    Last eye exam 9/08  . Hiatal hernia   . HSV-2 seropositive   . Iron deficiency anemia   . Low back pain   . Mixed hyperlipidemia   . Osteoarthritis   . Positive H. pylori test   . Schatzki's ring   . TIA (transient ischemic attack)    6/10  . Varicose veins     Past Surgical History:  Procedure Laterality Date  . ABDOMINAL HYSTERECTOMY  08/01/2012   total and BSO due uterine cancer  . CHOLECYSTECTOMY  Dec 26, 2010  . COLONOSCOPY  01/2006  . DILATION AND CURETTAGE OF UTERUS  06/12/12  . ESOPHAGOGASTRODUODENOSCOPY     Esophageal dilation 2007  . Lumbar discetomy  10/01   Dr. Larose Hires   . SPINE SURGERY    . TUBAL LIGATION  1994  . UMBILICAL HERNIA REPAIR  12/26/2010    There were no vitals filed  for this visit.  Subjective Assessment - 12/26/18 0907    Subjective  Pt stated she continues to have intermittent tingling in center of lower back, pain scale 4/10.  Reports positive results following massage with decreased pain and tingling for rest of the day    Patient Stated Goals  Feel less pain    Currently in Pain?  Yes    Pain Score  4     Pain Location  Back    Pain Orientation  Lower;Medial    Pain Descriptors / Indicators  Tingling    Pain Type  Chronic pain    Pain Onset  More than a month ago    Pain Frequency  Intermittent    Aggravating Factors   standing    Pain Relieving Factors  sitting/ laying down    Effect of Pain on Daily Activities  moderately, decreased activity                       [facility] Adult PT Treatment/Exercise - 12/26/18 0001      Lumbar Exercises: Stretches   Single Knee to Chest Stretch  2 reps;30 seconds    Single Knee to Chest Stretch Limitations  with towel      Lumbar Exercises: Standing   Heel Raises  10 reps    Heel Raises Limitations  heel and toe raises with HHA    Other Standing Lumbar Exercises  hip abd 10x BLE    Other Standing Lumbar Exercises  marching 10x 3"      Lumbar Exercises: Seated   Sit to Stand  10 reps    Sit to Stand Limitations  no hand use, height 20', cueing for mechanis      Lumbar Exercises: Supine   Ab Set  10 reps;3 seconds    AB Set Limitations  paired with breathing    Bent Knee Raise  10 reps;3 seconds    Bent Knee Raise Limitations  ab set paired wiht breathing, alternating legs    Bridge  10 reps    Bridge Limitations  3'' holds      Lumbar Exercises: Sidelying   Clam  Right;Left;10 reps;2 seconds    Clam Limitations  With RTB      Manual Therapy   Manual Therapy  Soft tissue mobilization    Manual therapy comments  All manual therapy completed separately from other skilled interventions    Soft tissue mobilization  Patient prone with bolster under patient's ankles, instrument soft  tissue mobilization to patient's bilateral gluteals using red weighted ball to patient's tolerance to decrease pain and muscular restrictions               PT Short Term Goals - 12/13/18 0825      PT SHORT TERM GOAL #1   Title  Patient will report understanding and regular compliance with HEP to improve strength, balance, and decrease pain.     Time  3    Period  Weeks    Status  On-going    Target Date  12/31/18      PT SHORT TERM GOAL #2   Title  Patient will report that her back pain has not exceeded a 5/10 over the course of a 1 week period indicating improved tolerance to daily activities.     Time  3    Period  Weeks    Status  On-going    Target Date  12/31/18      PT SHORT TERM GOAL #3   Title  Patient will demonstrate improvement of at least 1/2 MMT strength grade in all deficient muscle groups in order to improve mechanics with gait and ability to lift items such as groceries.     Time  3    Period  Weeks    Status  On-going    Target Date  12/31/18        PT Long Term Goals - 12/13/18 0827      PT LONG TERM GOAL #1   Title  Patient will demonstrate improvement of at least 1 MMT strength grade in all deficient muscle groups in order to improve mechanics with gait and ability to lift items such as groceries.    Time  6    Period  Weeks    Status  On-going      PT LONG TERM GOAL #2   Title  Patient will report that her back pain has not exceeded a 3/10 while ambulating for at least 20 minutes indicating improved ability to go to shop at the grocery store and perform household activities.     Time  6  Period  Weeks    Status  On-going      PT LONG TERM GOAL #3   Title  Patient will demonstrate ability to perform the 5xSTS test with an improvement of at least 10 seconds and without upper extremity assistance indicating improved balance and functional strength.     Time  6    Period  Weeks    Status  On-going      PT LONG TERM GOAL #4   Title  Patient  will demonstrate ability to maintain single limb stance on each lower extremity for at least 15 seconds indicating improved lower extremity stability and balance for ease of navigating stairs.     Time  6    Period  Weeks    Status  On-going            Plan - 12/26/18 1407    Clinical Impression Statement  Continued session focus wiht abdominal/proximal strengthening wiht additional standing exericses for hip strengthening as well.  Therapist facilitation for proper form and mechanics with new exercises.  Pt reoprts increased tingling with lumbar extension with bridges, encouraged to add abdominal sets iwth activities with improved tolerance wiht exercise.  EOS wiht manual to address soft tissue restrictions to lumbar paraspinals and QL.  Noted increased tenderness over PSIS, check SI alignment next session with appropriate abdominal/gluteal strengthening exercises and gait with ab set following MET as needed.      Clinical Impairments Affecting Rehab Potential  Positive: Motivated; Negative: Comorbidities    PT Frequency  2x / week    PT Duration  6 weeks    PT Treatment/Interventions  ADLs/Self Care Home Management;Aquatic Therapy;DME Instruction;Gait training;Stair training;Functional mobility training;Therapeutic activities;Therapeutic exercise;Balance training;Neuromuscular re-education;Patient/family education;Orthotic Fit/Training;Manual techniques;Passive range of motion;Dry needling;Energy conservation;Taping    PT Next Visit Plan  Next sessoin assess SI alignment wiht MET and exercises PRN.  Perform reflex testing as needed. Lumbar stretching, core strengthening, functional LE strengthening. Follow up on effectiveness of Soft tissue mobilization to decrease restrictions and pain and continue if positive response. Progress to standing functional strengthening next session.     PT Home Exercise Plan  12/10/18: Double knee to chest 3x20'' 1x/day; 3/4: bent knee raise; 12/20/18: LTR 5x5'', [facility]  1x/day       Patient will benefit from skilled therapeutic intervention in order to improve the following deficits and impairments:  Abnormal gait, Decreased balance, Decreased endurance, Decreased mobility, Difficulty walking, Increased muscle spasms, Decreased range of motion, Improper body mechanics, Decreased activity tolerance, Obesity, Decreased strength, Increased fascial restricitons, Postural dysfunction, Pain  Visit Diagnosis: Low back pain, unspecified back pain laterality, unspecified chronicity, unspecified whether sciatica present  Muscle weakness (generalized)  Other symptoms and signs involving the musculoskeletal system     Problem List Patient Active Problem List   Diagnosis Date Noted  . DDD (degenerative disc disease), lumbar 11/22/2018  . Lichen planus 70/62/3762  . Varicose vein of leg 12/17/2017  . Retinopathy due to secondary diabetes mellitus, without macular edema, with mild nonproliferative retinopathy ([place]) 09/17/2017  . Stress at home 01/09/2017  . Allergic rhinitis 01/04/2016  . Leg swelling 05/25/2015  . Chest congestion 11/17/2014  . Vaginitis and vulvovaginitis 11/17/2014  . Hoarse voice quality 07/22/2014  . Osteoarthritis of right knee 12/31/2012  . Endometrial cancer ([place]) 08/25/2012  . Hot flashes due to surgical menopause 08/20/2012  . Neuralgia 02/26/2012  . HTN, goal below 130/80 06/29/2011  . Arm pain, left 06/28/2011  . Carotid artery bruit 05/15/2011  .  Palpitations 05/15/2011  . OTHER DYSPHAGIA 12/08/2010  . OBESITY 12/16/2009  . CONSTIPATION, CHRONIC 12/01/2009  . Diabetes mellitus type 2 in obese ([place]) 03/31/2009  . Obesity (BMI 30-39.9) 01/24/2007  . Hyperlipidemia 09/25/2006  . Anxiety state 09/25/2006  . GLAUCOMA NOS 09/25/2006  . GERD 09/25/2006  . Chronic pain 09/25/2006   Ihor Austin, LPTA; [place]  Aldona Lento 12/26/2018, 2:11 PM  [place], [place], [address] Phone: [phone]   Fax:  [phone]  Name: [name] [name] MRN: [id number] Date of Birth: 18-Sep-1956

## 2018-12-31 ENCOUNTER — Ambulatory Visit (HOSPITAL_COMMUNITY): Payer: Medicare HMO

## 2018-12-31 ENCOUNTER — Other Ambulatory Visit: Payer: Self-pay

## 2018-12-31 ENCOUNTER — Encounter (HOSPITAL_COMMUNITY): Payer: Self-pay

## 2018-12-31 DIAGNOSIS — M6281 Muscle weakness (generalized): Secondary | ICD-10-CM | POA: Diagnosis not present

## 2018-12-31 DIAGNOSIS — R29898 Other symptoms and signs involving the musculoskeletal system: Secondary | ICD-10-CM

## 2018-12-31 DIAGNOSIS — M545 Low back pain, unspecified: Secondary | ICD-10-CM

## 2018-12-31 NOTE — Patient Instructions (Signed)
Functional Quadriceps: Sit to Stand    Sit on edge of chair, feet flat on floor. Stand upright, extending knees fully. Repeat 10 times per set. Do 1-2 sets per session. Do 2 sessions per day.  http://orth.exer.us/735   Copyright  VHI. All rights reserved.   Single Leg Balance: Eyes Open    Stand on right leg with eyes open. Hold 30 seconds. 3 reps 2 times per day.  http://ggbe.exer.us/5   Copyright  VHI. All rights reserved.   Bracing With Leg March (Hook-Lying)    With neutral spine, tighten pelvic floor and abdominals and hold. Alternating legs, lift foot ___ inches and return to floor. Repeat ___ times. Do ___ times a day.   Copyright  VHI. All rights reserved.
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
## 2019-01-02 ENCOUNTER — Other Ambulatory Visit: Payer: Self-pay

## 2019-01-02 ENCOUNTER — Ambulatory Visit (HOSPITAL_COMMUNITY): Payer: Medicare HMO | Admitting: Physical Therapy

## 2019-01-03 ENCOUNTER — Telehealth (HOSPITAL_COMMUNITY): Payer: Self-pay | Admitting: Physical Therapy

## 2019-01-03 NOTE — Telephone Encounter (Signed)
Therapist called regarding clinic being closed for the next 2 weeks. Patient stated she felt pleased with her current HEP and stated that she would like to be called once the clinic is re-opened to schedule her appointments.  Clarene Critchley PT, DPT 3:20 PM, 01/03/19 [phone]

## 2019-01-07 ENCOUNTER — Ambulatory Visit (HOSPITAL_COMMUNITY): Payer: Medicare HMO | Admitting: Physical Therapy

## 2019-01-09 ENCOUNTER — Telehealth (HOSPITAL_COMMUNITY): Payer: Self-pay | Admitting: Physical Therapy

## 2019-01-09 ENCOUNTER — Ambulatory Visit (HOSPITAL_COMMUNITY): Payer: Medicare HMO | Admitting: Physical Therapy

## 2019-01-09 NOTE — Telephone Encounter (Signed)
Therapist called to check in on patient and see if she had any questions about her HEP. Patient reported that she did not have any questions and that she felt her current exercises were working and that they were challenging enough. Therapist reported that someone would contact her when the clinic reopened to schedule her remaining appointments and patient gave consent to this.  Clarene Critchley PT, DPT 10:08 AM, 01/09/19 [phone]

## 2019-01-14 ENCOUNTER — Encounter (HOSPITAL_COMMUNITY): Payer: Medicare HMO | Admitting: Physical Therapy

## 2019-01-14 DIAGNOSIS — L438 Other lichen planus: Secondary | ICD-10-CM | POA: Diagnosis not present

## 2019-01-14 DIAGNOSIS — Z5181 Encounter for therapeutic drug level monitoring: Secondary | ICD-10-CM | POA: Diagnosis not present

## 2019-01-14 DIAGNOSIS — Z79899 Other long term (current) drug therapy: Secondary | ICD-10-CM | POA: Diagnosis not present

## 2019-01-14 DIAGNOSIS — L439 Lichen planus, unspecified: Secondary | ICD-10-CM | POA: Diagnosis not present

## 2019-01-15 ENCOUNTER — Encounter (HOSPITAL_COMMUNITY): Payer: Medicare HMO | Admitting: Physical Therapy

## 2019-01-16 ENCOUNTER — Encounter (HOSPITAL_COMMUNITY): Payer: Medicare HMO | Admitting: Physical Therapy

## 2019-01-17 ENCOUNTER — Encounter (HOSPITAL_COMMUNITY): Payer: Medicare HMO | Admitting: Physical Therapy

## 2019-01-20 ENCOUNTER — Other Ambulatory Visit: Payer: Self-pay | Admitting: Family Medicine

## 2019-01-20 MED ORDER — ALPRAZOLAM 1 MG PO TABS: ORAL_TABLET | ORAL | 2 refills | Status: DC

## 2019-01-20 MED ORDER — HYDROCODONE-ACETAMINOPHEN 5-325 MG PO TABS: 1.0000 | ORAL_TABLET | Freq: Four times a day (QID) | ORAL | 0 refills | Status: DC | PRN

## 2019-01-20 NOTE — Telephone Encounter (Signed)
Pt called in, needs pain med and xanax refilled

## 2019-01-27 ENCOUNTER — Telehealth (HOSPITAL_COMMUNITY): Payer: Self-pay

## 2019-01-27 NOTE — Telephone Encounter (Signed)
Weekly phone call made to check in on pt's HEP, current status, and/or any changes in her pain. Pt stating that she was still pleased with her exercises, she is still doing them, and feel they are challenging enough. No changes desired and pt declined telehealth sessions at this time.  Geraldine Solar PT, DPT

## 2019-02-03 ENCOUNTER — Telehealth (HOSPITAL_COMMUNITY): Payer: Self-pay

## 2019-02-03 NOTE — Telephone Encounter (Signed)
Weekly phone call made to pt today. Pt is still pleased with her current HEP and doesn't have any questions or concerns. Asked when our clinic would be reopened and PT told her our tentative reopen date is 03/17/2019 but that we would in touch with her about that. Educated pt that if she had any questions or concerns about her HEP she could call our office, otherwise, we would call again next week to check on her and she verbalized understanding.    Geraldine Solar PT, DPT

## 2019-02-14 ENCOUNTER — Telehealth (HOSPITAL_COMMUNITY): Payer: Self-pay

## 2019-02-14 NOTE — Telephone Encounter (Signed)
I spoke with [name] [name] for her weekly call to see how she is feeling and how her exercises are going. She reported they are going well and agreed when I offered to provide her with additional exercises for her leg strength and balance. I will e-mail and mail through postal service as she requested both.   Kipp Brood, PT, DPT, [facility] Physical Therapist with [facility]  02/14/2019 2:40 PM      Supine Bridge reps: 10 sets: 3 hold: 5 seconds daily: 1  weekly: 7      Exercise image step 1   Exercise image step 2  Setup  Begin lying on your back with your arms resting at your sides, your legs bent at the knees and your feet flat on the ground. Movement  Tighten your abdominals and slowly lift your hips off the floor into a bridge position, keeping your back straight. Tip  Make sure to keep your trunk stiff throughout the exercise and your arms flat on the floor. Sit to Stand without Arm Support reps: 10 sets: 3 daily: 1 weekly: 7   Exercise image step 1   Exercise image step 2   Exercise image step 3  Setup  Begin by sitting upright on a chair with your feet slightly wider than shoulder width apart. Movement  Reach out with your arms and lean forward at your hips until your bottom starts to lift off the chair. Move your body into a standing upright position, then reverse the order of your movements to return to the starting position. Tip  Make sure not to let your knees collapse inward during the exercise. Disclaimer: This program provides exercises related to your condition that you can perform at home. As there is a risk of injury with any activity, use caution when performing exercises. If you experience any pain or discomfort, discontinue the exercises and contact your health care provider.  Login URL: [email or web address] . Access Code: [email or web address] . Date printed: 02/14/2019 Page 1  Standing Tandem Balance with Counter Support reps: 5 sets: 2 hold:  up to 30 seconds daily: 1  weekly: 7      Exercise image step 1   Exercise image step 2  perform 5 times with Right foot in back and 5 times with Left foot in back   Setup  Begin in a standing upright position with your hands resting on a counter. Movement  Place one foot directly behind the other, so that you are in a heel-to-toe position. Maintain your balance in this position. Tip  Make sure to maintain an upright posture and use the counter to help you balance as needed.

## 2019-02-20 ENCOUNTER — Other Ambulatory Visit: Payer: Self-pay | Admitting: Family Medicine

## 2019-02-25 ENCOUNTER — Telehealth (HOSPITAL_COMMUNITY): Payer: Self-pay

## 2019-02-25 NOTE — Telephone Encounter (Signed)
I called Ms. Esterline and left a voice message on her home number. I informed her our office is re-opening and that if she would like to resume care at this time we can schedule and visit for her in the clinic. She stated that she does not want to come into the clinic at this time as she is trying to stay home as much as possible to protect her mother. She is interested in a telehealth visit if that is able to be set up and covered with her insurance. I confirmed she has a video capable device and Internet at home. She states if her insurance does not cover her telehealth she will continue with her HEP and be discharged.  Kipp Brood, PT, DPT, [facility] Physical Therapist with [facility]  02/25/2019 2:02 PM

## 2019-02-27 ENCOUNTER — Telehealth (HOSPITAL_COMMUNITY): Payer: Self-pay | Admitting: Family Medicine

## 2019-02-27 NOTE — Telephone Encounter (Signed)
02/27/19  I spoke to patient and she asked that I verify Telehealth benefits  - COVERED AT 100%.  As I was working to schedule she wanted a later day appt and so I offered Wed. 5/20 in office and she said that she wanted to do that.  She asked about wearing a mask and I told her to go ahead and wear it and how we would take her back once she got in the office to another room where she wouldn't some in contact with any other patients and she was fine with that.

## 2019-03-05 ENCOUNTER — Ambulatory Visit (HOSPITAL_COMMUNITY): Payer: Medicare HMO

## 2019-03-12 ENCOUNTER — Ambulatory Visit (HOSPITAL_COMMUNITY): Payer: Medicare HMO | Attending: Family Medicine

## 2019-03-12 ENCOUNTER — Encounter (HOSPITAL_COMMUNITY): Payer: Self-pay

## 2019-03-12 ENCOUNTER — Other Ambulatory Visit: Payer: Self-pay

## 2019-03-12 DIAGNOSIS — M545 Low back pain, unspecified: Secondary | ICD-10-CM

## 2019-03-12 DIAGNOSIS — R29898 Other symptoms and signs involving the musculoskeletal system: Secondary | ICD-10-CM | POA: Diagnosis not present

## 2019-03-12 DIAGNOSIS — M6281 Muscle weakness (generalized): Secondary | ICD-10-CM | POA: Insufficient documentation

## 2019-03-12 NOTE — Patient Instructions (Signed)
Sit to Stand with Arms Crossed reps: 10 sets: 3 daily: 1 weekly: 7   Exercise image step 1   Exercise image step 2   Exercise image step 3  Setup  Begin sitting upright in a chair. Movement  Cross your arms on your chest and lean your torso forward, then press into your feet to stand up. Slowly sit back down and repeat. Tip  Make sure to maintain your balance and try to keep your weight evenly distributed between both legs. Do not lock your knees when you are standing. Supine Bridge reps: 10 sets: 3 hold: 3 seconds daily: 1  weekly: 7      Exercise image step 1   Exercise image step 2  Setup  Begin lying on your back with your arms resting at your sides, your legs bent at the knees and your feet flat on the ground. Movement  Tighten your abdominals and slowly lift your hips off the floor into a bridge position, keeping your back straight. Tip  Make sure to keep your trunk stiff throughout the exercise and your arms flat on the floor. Disclaimer: This program provides exercises related to your condition that you can perform at home. As there is a risk of injury with any activity, use caution when performing exercises. If you experience any pain or discomfort, discontinue the exercises and contact your health care provider.  Login URL: [email or web address] . Access Code: [email or web address] . Date printed: 03/12/2019 Page 1  Standing Lumbar Extension with Counter reps: 10 sets: 1 hold: 5 secpmds daily: 1  weekly: 7      Exercise image step 1   Exercise image step 2  Setup  Begin in a standing upright position in front of a counter, with your hands resting on your hips. Movement  Slowly arch your trunk backwards and hold. Tip  Make sure to use the counter for balance as needed and do not bend your knees during the exercise.
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
## 2019-03-19 ENCOUNTER — Encounter (HOSPITAL_COMMUNITY): Payer: Self-pay | Admitting: Physical Therapy

## 2019-03-19 ENCOUNTER — Other Ambulatory Visit: Payer: Self-pay

## 2019-03-19 ENCOUNTER — Ambulatory Visit (HOSPITAL_COMMUNITY): Payer: Medicare HMO | Attending: Family Medicine | Admitting: Physical Therapy

## 2019-03-19 DIAGNOSIS — M6281 Muscle weakness (generalized): Secondary | ICD-10-CM

## 2019-03-19 DIAGNOSIS — M545 Low back pain, unspecified: Secondary | ICD-10-CM

## 2019-03-19 DIAGNOSIS — R29898 Other symptoms and signs involving the musculoskeletal system: Secondary | ICD-10-CM | POA: Diagnosis not present
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
## 2019-03-25 ENCOUNTER — Ambulatory Visit (HOSPITAL_COMMUNITY): Payer: Medicare HMO | Admitting: Physical Therapy

## 2019-03-25 ENCOUNTER — Other Ambulatory Visit: Payer: Self-pay

## 2019-03-25 DIAGNOSIS — R29898 Other symptoms and signs involving the musculoskeletal system: Secondary | ICD-10-CM | POA: Diagnosis not present

## 2019-03-25 DIAGNOSIS — M6281 Muscle weakness (generalized): Secondary | ICD-10-CM | POA: Diagnosis not present

## 2019-03-25 DIAGNOSIS — M545 Low back pain, unspecified: Secondary | ICD-10-CM

## 2019-03-25 NOTE — Therapy (Signed)
[facility] [address] [place], [place], [address] Phone: [phone]   Fax:  [phone]  Physical Therapy Treatment  Patient Details  Name: [name] [name] MRN: [id number] Date of Birth: [birth year] Referring Provider (PT): Buelah Manis, Modena Nunnery, MD   Encounter Date: 03/25/2019  PT End of Session - 03/25/19 1019    Visit Number  3    Number of Visits  4    Date for PT Re-Evaluation  04/09/19   Minireassess complete 3/17 visit #6   Authorization Type  [employer] HMO visits based on medical necessity - auth required  (Print progress notes also)    Authorization Time Period  12/10/18 - 01/21/19; 03/12/19-04/09/19    Authorization - Visit Number  3    Authorization - Number of Visits  10    PT Start Time  2426    PT Stop Time  0915    PT Time Calculation (min)  43 min    Equipment Utilized During Treatment  Gait belt    Activity Tolerance  Patient tolerated treatment well;No increased pain    Behavior During Therapy  WFL for tasks assessed/performed       Past Medical History:  Diagnosis Date  . Anxiety   . Cancer ([place])   . Chronic abdominal pain   . Constipation   . Depression   . Diabetes mellitus, type 2 ([facility])   . Essential hypertension, benign   . GERD (gastroesophageal reflux disease)   . Glaucoma    Last eye exam 9/08  . Hiatal hernia   . HSV-2 seropositive   . Iron deficiency anemia   . Low back pain   . Mixed hyperlipidemia   . Osteoarthritis   . Positive H. pylori test   . Schatzki's ring   . TIA (transient ischemic attack)    6/10  . Varicose veins     Past Surgical History:  Procedure Laterality Date  . ABDOMINAL HYSTERECTOMY  08/01/2012   total and BSO due uterine cancer  . CHOLECYSTECTOMY  Dec 26, 2010  . COLONOSCOPY  01/2006  . DILATION AND CURETTAGE OF UTERUS  06/12/12  . ESOPHAGOGASTRODUODENOSCOPY     Esophageal dilation 2007  . Lumbar discetomy  10/01   Dr. Larose Hires   . SPINE SURGERY    . TUBAL  LIGATION  1994  . UMBILICAL HERNIA REPAIR  12/26/2010    There were no vitals filed for this visit.                    [facility] Adult PT Treatment/Exercise - 03/25/19 0001      Lumbar Exercises: Stretches   Double Knee to Chest Stretch  3 reps;20 seconds    Lower Trunk Rotation  5 reps    Lower Trunk Rotation Limitations  10 second holds    Standing Extension  10 reps;5 seconds      Lumbar Exercises: Standing   Functional Squats  10 reps    Functional Squats Limitations  2 sets no UE assist    Forward Lunge  10 reps    Forward Lunge Limitations  2 sets, 4'' step with abdominal set cues to improve hip abduction    Other Standing Lumbar Exercises  Standing on airex marching 3'' holds x 20 alternating legs - cues to activate glutes to reduce foot pronation. Standing extension x 10 at countertop    Other Standing Lumbar Exercises  Pallof press in semi-tandem position x20 with RTB in //  bars. Vector stance forward, side, back 5'' each with VCs to stand tall and lower LE for good form x 5 each intermittent UE assist      Lumbar Exercises: Seated   Hip Flexion on Ball  10 reps    Sit to Stand  10 reps    Sit to Stand Limitations  no UE assist from low mat      Lumbar Exercises: Supine   Bridge  3 seconds;10 reps    Bridge Limitations  2 sets               PT Short Term Goals - 03/12/19 1717      PT SHORT TERM GOAL #1   Title  Patient will report understanding and regular compliance with HEP to improve strength, balance, and decrease pain.     Baseline  12/31/18:  Reports compliance with HEP daily    Status  Achieved      PT SHORT TERM GOAL #2   Title  Patient will report that her back pain has not exceeded a 5/10 over the course of a 1 week period indicating improved tolerance to daily activities.     Time  2    Period  Weeks    Status  Revised    Target Date  03/26/19      PT SHORT TERM GOAL #3   Title  Patient will demonstrate improvement of at least  1/2 MMT strength grade in all deficient muscle groups in order to improve mechanics with gait and ability to lift items such as groceries.     Baseline  3/17: see MMT    Status  Achieved        PT Long Term Goals - 03/12/19 1718      PT LONG TERM GOAL #1   Title  Patient will demonstrate improvement of at least 1 MMT strength grade in all deficient muscle groups in order to improve mechanics with gait and ability to lift items such as groceries.    Time  4    Period  Weeks    Status  Revised    Target Date  04/09/19      PT LONG TERM GOAL #2   Title  Patient will report that her back pain has not exceeded a 3/10 while ambulating for at least 20 minutes indicating improved ability to go to shop at the grocery store and perform household activities.     Time  4    Period  Weeks    Status  Revised    Target Date  04/09/19      PT LONG TERM GOAL #3   Title  Patient will demonstrate ability to perform the 5xSTS test with an improvement of at least 10 seconds and without upper extremity assistance indicating improved balance and functional strength.     Time  4    Period  Weeks    Status  Revised    Target Date  04/09/19      PT LONG TERM GOAL #4   Title  Patient will demonstrate ability to maintain single limb stance on each lower extremity for at least 15 seconds indicating improved lower extremity stability and balance for ease of navigating stairs.     Time  4    Period  Weeks    Status  Revised    Target Date  04/09/19            Plan - 03/25/19 1019    Clinical  Impression Statement  continued with focus on improving core/LE strength and lumbar ROM.  Pt able to complete all exercises this session without complaints of pain or rest break.  Able to increase to 2 sets of each exercise.  Pt requries most cues with correct form/alignment with squats and lunges.  Added marching on physioball today for core strengthening.      Rehab Potential  Good    Clinical Impairments  Affecting Rehab Potential  Positive: Motivated; Negative: Comorbidities    PT Frequency  2x / week    PT Duration  6 weeks    PT Treatment/Interventions  ADLs/Self Care Home Management;Aquatic Therapy;DME Instruction;Gait training;Stair training;Functional mobility training;Therapeutic activities;Therapeutic exercise;Balance training;Neuromuscular re-education;Patient/family education;Orthotic Fit/Training;Manual techniques;Passive range of motion;Dry needling;Energy conservation;Taping;Electrical Stimulation;Iontophoresis 4mg /ml Dexamethasone;Moist Heat;Cryotherapy    PT Next Visit Plan  continue with focus on core strengthening, functional LE strengthening. Perform soft tissue mobilization to decrease restrictions and pain PRN, monitor response.      PT Home Exercise Plan  NEW: 03/12/19 - bridge, sit<>stand, standing lumbar ext; (12/10/18: Double knee to chest 3x20'' 1x/day; 3/4: bent knee raise; 12/20/18: LTR 5x5'', SKTC 1x/day; 3/17: STS and SLS)    Consulted and Agree with Plan of Care  Patient       Patient will benefit from skilled therapeutic intervention in order to improve the following deficits and impairments:  Abnormal gait, Decreased balance, Decreased endurance, Decreased mobility, Difficulty walking, Increased muscle spasms, Decreased range of motion, Improper body mechanics, Decreased activity tolerance, Obesity, Decreased strength, Increased fascial restricitons, Postural dysfunction, Pain  Visit Diagnosis: Low back pain, unspecified back pain laterality, unspecified chronicity, unspecified whether sciatica present  Muscle weakness (generalized)  Other symptoms and signs involving the musculoskeletal system     Problem List Patient Active Problem List   Diagnosis Date Noted  . DDD (degenerative disc disease), lumbar 11/22/2018  . Lichen planus 52/77/8242  . Varicose vein of leg 12/17/2017  . Retinopathy due to secondary diabetes mellitus, without macular edema, with mild  nonproliferative retinopathy ([place]) 09/17/2017  . Stress at home 01/09/2017  . Allergic rhinitis 01/04/2016  . Leg swelling 05/25/2015  . Chest congestion 11/17/2014  . Vaginitis and vulvovaginitis 11/17/2014  . Hoarse voice quality 07/22/2014  . Osteoarthritis of right knee 12/31/2012  . Endometrial cancer ([place]) 08/25/2012  . Hot flashes due to surgical menopause 08/20/2012  . Neuralgia 02/26/2012  . HTN, goal below 130/80 06/29/2011  . Arm pain, left 06/28/2011  . Carotid artery bruit 05/15/2011  . Palpitations 05/15/2011  . OTHER DYSPHAGIA 12/08/2010  . OBESITY 12/16/2009  . CONSTIPATION, CHRONIC 12/01/2009  . Diabetes mellitus type 2 in obese ([place]) 03/31/2009  . Obesity (BMI 30-39.9) 01/24/2007  . Hyperlipidemia 09/25/2006  . Anxiety state 09/25/2006  . GLAUCOMA NOS 09/25/2006  . GERD 09/25/2006  . Chronic pain 09/25/2006   Teena Irani, PTA/CLT [phone]  Teena Irani 03/25/2019, 10:24 AM  [facility] [place], [place], [address] Phone: [phone]   Fax:  [phone]  Name: [name] [name] MRN: [id number] Date of Birth: [birth year]

## 2019-04-01 ENCOUNTER — Other Ambulatory Visit: Payer: Self-pay

## 2019-04-01 ENCOUNTER — Encounter (HOSPITAL_COMMUNITY): Payer: Self-pay | Admitting: Physical Therapy

## 2019-04-01 ENCOUNTER — Ambulatory Visit (HOSPITAL_COMMUNITY): Payer: Medicare HMO | Admitting: Physical Therapy

## 2019-04-01 DIAGNOSIS — M545 Low back pain, unspecified: Secondary | ICD-10-CM

## 2019-04-01 DIAGNOSIS — M6281 Muscle weakness (generalized): Secondary | ICD-10-CM | POA: Diagnosis not present

## 2019-04-01 DIAGNOSIS — R29898 Other symptoms and signs involving the musculoskeletal system: Secondary | ICD-10-CM
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
## 2019-04-01 NOTE — Patient Instructions (Addendum)
Anterior    Stand with equal weight on both feet. Lunge with right leg in forward direction and return _10__ times. Repeat other leg _10__ reps _1__ times per day. Standing near countertop in case needed for balance  http://gglj.exer.us/5   Copyright  VHI. All rights reserved.  Abduction: Clam (Eccentric) - Side-Lying    Lie on side with knees bent. Lift top knee, keeping feet together. Keep trunk steady. Slowly lower for 3-5 seconds. __10_ reps per set, _2__ sets per day, _7__ days per week.  http://ecce.exer.us/65   Copyright  VHI. All rights reserved.

## 2019-04-08 ENCOUNTER — Ambulatory Visit (HOSPITAL_COMMUNITY): Payer: Medicare HMO | Admitting: Physical Therapy

## 2019-04-15 DIAGNOSIS — Z5181 Encounter for therapeutic drug level monitoring: Secondary | ICD-10-CM | POA: Diagnosis not present

## 2019-04-15 DIAGNOSIS — Z79899 Other long term (current) drug therapy: Secondary | ICD-10-CM | POA: Diagnosis not present

## 2019-04-15 DIAGNOSIS — L439 Lichen planus, unspecified: Secondary | ICD-10-CM | POA: Diagnosis not present

## 2019-04-15 DIAGNOSIS — L438 Other lichen planus: Secondary | ICD-10-CM | POA: Diagnosis not present

## 2019-05-21 ENCOUNTER — Other Ambulatory Visit: Payer: Self-pay | Admitting: *Deleted

## 2019-05-21 MED ORDER — BD SWAB SINGLE USE REGULAR PADS: MEDICATED_PAD | 3 refills | Status: DC

## 2019-05-21 MED ORDER — ACCU-CHEK SMARTVIEW CONTROL VI LIQD: 3 refills | Status: DC

## 2019-05-21 MED ORDER — ACCU-CHEK FASTCLIX LANCETS MISC: 3 refills | Status: DC

## 2019-05-22 ENCOUNTER — Other Ambulatory Visit (HOSPITAL_COMMUNITY): Payer: Self-pay | Admitting: Family Medicine

## 2019-05-26 ENCOUNTER — Other Ambulatory Visit: Payer: Self-pay | Admitting: Family Medicine

## 2019-05-26 MED ORDER — HYDROCODONE-ACETAMINOPHEN 5-325 MG PO TABS: 1.0000 | ORAL_TABLET | Freq: Four times a day (QID) | ORAL | 0 refills | Status: DC | PRN

## 2019-05-26 NOTE — Telephone Encounter (Signed)
Requested Prescriptions   Pending Prescriptions Disp Refills  . ALPRAZolam (XANAX) 1 MG tablet [Pharmacy Med Name: ALPRAZOLAM 1MG  TABLETS] 45 tablet     Sig: TAKE 1 TABLET(1 MG) BY MOUTH TWICE DAILY AS NEEDED    Last OV 11/22/2018  Last written 01/20/2019

## 2019-05-26 NOTE — Telephone Encounter (Signed)
Received call from patient. Requested refill on Hydrocodone/APAP.   Ok to refill?? Last office visit 11/22/2018. Last refill 01/20/2019.

## 2019-06-04 ENCOUNTER — Ambulatory Visit (INDEPENDENT_AMBULATORY_CARE_PROVIDER_SITE_OTHER): Payer: Medicare HMO | Admitting: *Deleted

## 2019-06-04 ENCOUNTER — Telehealth: Payer: Self-pay | Admitting: Family Medicine

## 2019-06-04 DIAGNOSIS — Z23 Encounter for immunization: Secondary | ICD-10-CM

## 2019-06-04 MED ORDER — ALBUTEROL SULFATE HFA 108 (90 BASE) MCG/ACT IN AERS: INHALATION_SPRAY | RESPIRATORY_TRACT | 0 refills | Status: DC

## 2019-06-04 NOTE — Telephone Encounter (Signed)
Patient is calling to see if she can get a refill on her albuterol and also ask a question about the shingles vaccine that we had called her about [phone] [facility] [address]

## 2019-06-04 NOTE — Telephone Encounter (Signed)
Prescription sent to pharmacy.   Patient states that she will not get shingles vaccine at this time because her co pay is [place] and she had to buy a few more expensive meds. States that she will get it as soon as her finances allow. Advised that she can get it at any time.

## 2019-06-12 ENCOUNTER — Ambulatory Visit (INDEPENDENT_AMBULATORY_CARE_PROVIDER_SITE_OTHER): Payer: Medicare HMO | Admitting: Otolaryngology

## 2019-06-12 DIAGNOSIS — K219 Gastro-esophageal reflux disease without esophagitis: Secondary | ICD-10-CM | POA: Diagnosis not present

## 2019-06-12 DIAGNOSIS — H7202 Central perforation of tympanic membrane, left ear: Secondary | ICD-10-CM | POA: Diagnosis not present

## 2019-06-12 DIAGNOSIS — R490 Dysphonia: Secondary | ICD-10-CM

## 2019-06-16 ENCOUNTER — Other Ambulatory Visit: Payer: Self-pay

## 2019-06-16 DIAGNOSIS — R6889 Other general symptoms and signs: Secondary | ICD-10-CM | POA: Diagnosis not present

## 2019-06-16 DIAGNOSIS — Z20822 Contact with and (suspected) exposure to covid-19: Secondary | ICD-10-CM

## 2019-06-18 LAB — NOVEL CORONAVIRUS, NAA: SARS-CoV-2, NAA: NOT DETECTED

## 2019-06-20 DIAGNOSIS — H52229 Regular astigmatism, unspecified eye: Secondary | ICD-10-CM | POA: Diagnosis not present

## 2019-07-01 ENCOUNTER — Other Ambulatory Visit (HOSPITAL_COMMUNITY): Payer: Self-pay | Admitting: Family Medicine

## 2019-07-01 DIAGNOSIS — Z1231 Encounter for screening mammogram for malignant neoplasm of breast: Secondary | ICD-10-CM

## 2019-07-07 ENCOUNTER — Other Ambulatory Visit: Payer: Self-pay

## 2019-07-08 ENCOUNTER — Encounter: Payer: Self-pay | Admitting: Family Medicine

## 2019-07-08 ENCOUNTER — Ambulatory Visit (INDEPENDENT_AMBULATORY_CARE_PROVIDER_SITE_OTHER): Payer: Medicare HMO | Admitting: Family Medicine

## 2019-07-08 VITALS — BP 130/74 | HR 78 | Temp 98.5°F | Resp 14 | Ht 62.0 in | Wt 246.0 lb

## 2019-07-08 DIAGNOSIS — Z6841 Body Mass Index (BMI) 40.0 and over, adult: Secondary | ICD-10-CM | POA: Diagnosis not present

## 2019-07-08 DIAGNOSIS — E7800 Pure hypercholesterolemia, unspecified: Secondary | ICD-10-CM

## 2019-07-08 DIAGNOSIS — Z23 Encounter for immunization: Secondary | ICD-10-CM | POA: Diagnosis not present

## 2019-07-08 DIAGNOSIS — E1169 Type 2 diabetes mellitus with other specified complication: Secondary | ICD-10-CM

## 2019-07-08 DIAGNOSIS — I10 Essential (primary) hypertension: Secondary | ICD-10-CM

## 2019-07-08 DIAGNOSIS — E669 Obesity, unspecified: Secondary | ICD-10-CM

## 2019-07-08 NOTE — Patient Instructions (Signed)
F/U 4 months for Physical  

## 2019-07-08 NOTE — Assessment & Plan Note (Signed)
Discussed dietary changes, weight up 15lbs

## 2019-07-08 NOTE — Assessment & Plan Note (Signed)
Check lipid and LFT, continue Crestor

## 2019-07-08 NOTE — Assessment & Plan Note (Signed)
Well controlled no changes 

## 2019-07-08 NOTE — Assessment & Plan Note (Signed)
Well controlled continue metformin
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
## 2019-07-09 ENCOUNTER — Encounter: Payer: Self-pay | Admitting: *Deleted

## 2019-07-09 LAB — CBC WITH DIFFERENTIAL/PLATELET
Absolute Monocytes: 528 cells/uL (ref 200–950)
Basophils Absolute: 56 cells/uL (ref 0–200)
Basophils Relative: 0.7 %
Eosinophils Absolute: 296 cells/uL (ref 15–500)
Eosinophils Relative: 3.7 %
HCT: 35 % (ref 35.0–45.0)
Hemoglobin: 11.5 g/dL — ABNORMAL LOW (ref 11.7–15.5)
Lymphs Abs: 1680 cells/uL (ref 850–3900)
MCH: 29 pg (ref 27.0–33.0)
MCHC: 32.9 g/dL (ref 32.0–36.0)
MCV: 88.2 fL (ref 80.0–100.0)
MPV: 9.7 fL (ref 7.5–12.5)
Monocytes Relative: 6.6 %
Neutro Abs: 5440 cells/uL (ref 1500–7800)
Neutrophils Relative %: 68 %
Platelets: 314 10*3/uL (ref 140–400)
RBC: 3.97 10*6/uL (ref 3.80–5.10)
RDW: 13.9 % (ref 11.0–15.0)
Total Lymphocyte: 21 %
WBC: 8 10*3/uL (ref 3.8–10.8)

## 2019-07-09 LAB — LIPID PANEL
Cholesterol: 114 mg/dL (ref <200)
HDL: 46 mg/dL — ABNORMAL LOW (ref >=50)
LDL Cholesterol (Calc): 52 mg/dL (calc)
Non-HDL Cholesterol (Calc): 68 mg/dL (calc) (ref <130)
Total CHOL/HDL Ratio: 2.5 (calc) (ref <5.0)
Triglycerides: 80 mg/dL (ref <150)

## 2019-07-09 LAB — COMPREHENSIVE METABOLIC PANEL
AG Ratio: 1.1 (calc) (ref 1.0–2.5)
ALT: 9 U/L (ref 6–29)
AST: 16 U/L (ref 10–35)
Albumin: 4 g/dL (ref 3.6–5.1)
Alkaline phosphatase (APISO): 126 U/L (ref 37–153)
BUN: 16 mg/dL (ref 7–25)
CO2: 27 mmol/L (ref 20–32)
Calcium: 9.2 mg/dL (ref 8.6–10.4)
Chloride: 102 mmol/L (ref 98–110)
Creat: 0.84 mg/dL (ref 0.50–0.99)
Globulin: 3.7 g/dL (calc) (ref 1.9–3.7)
Glucose, Bld: 94 mg/dL (ref 65–99)
Potassium: 4.4 mmol/L (ref 3.5–5.3)
Sodium: 140 mmol/L (ref 135–146)
Total Bilirubin: 0.4 mg/dL (ref 0.2–1.2)
Total Protein: 7.7 g/dL (ref 6.1–8.1)

## 2019-07-10 ENCOUNTER — Telehealth: Payer: Self-pay | Admitting: *Deleted

## 2019-07-10 NOTE — Telephone Encounter (Signed)
Received call from patient.   Reports that she received Shingrix on 07/09/2019. States that injection site is red, warm to touch and feels hard.   Advised that this is local reaction to injection. Advised to use OTC IBU/APAP and ice pack. Advised if area worsens, starts to blister, or does not improve >3 days to contact office to eval.

## 2019-07-11 NOTE — Telephone Encounter (Signed)
Noted, agree with below 

## 2019-07-28 ENCOUNTER — Ambulatory Visit (HOSPITAL_COMMUNITY)
Admission: RE | Admit: 2019-07-28 | Discharge: 2019-07-28 | Disposition: A | Discharge status: Home / Self Care | Payer: Medicare HMO | Source: Ambulatory Visit | Attending: Family Medicine | Admitting: Family Medicine

## 2019-07-28 ENCOUNTER — Other Ambulatory Visit: Payer: Self-pay

## 2019-07-28 DIAGNOSIS — Z1231 Encounter for screening mammogram for malignant neoplasm of breast: Secondary | ICD-10-CM | POA: Diagnosis not present

## 2019-07-29 ENCOUNTER — Other Ambulatory Visit: Payer: Self-pay | Admitting: Family Medicine

## 2019-07-29 NOTE — Telephone Encounter (Signed)
Last seen: 07/08/2019 Last refilled: 05/26/2019

## 2019-08-07 ENCOUNTER — Encounter: Payer: Self-pay | Admitting: *Deleted

## 2019-08-18 DIAGNOSIS — L438 Other lichen planus: Secondary | ICD-10-CM | POA: Diagnosis not present

## 2019-08-18 DIAGNOSIS — Z5181 Encounter for therapeutic drug level monitoring: Secondary | ICD-10-CM | POA: Diagnosis not present

## 2019-08-18 DIAGNOSIS — L439 Lichen planus, unspecified: Secondary | ICD-10-CM | POA: Diagnosis not present

## 2019-08-18 DIAGNOSIS — Z79899 Other long term (current) drug therapy: Secondary | ICD-10-CM | POA: Diagnosis not present

## 2019-09-02 ENCOUNTER — Ambulatory Visit (INDEPENDENT_AMBULATORY_CARE_PROVIDER_SITE_OTHER): Payer: Medicare HMO

## 2019-09-02 DIAGNOSIS — Z23 Encounter for immunization: Secondary | ICD-10-CM

## 2019-10-06 ENCOUNTER — Other Ambulatory Visit: Payer: Self-pay | Admitting: *Deleted

## 2019-10-06 MED ORDER — ROSUVASTATIN CALCIUM 10 MG PO TABS: 10.0000 mg | ORAL_TABLET | Freq: Every day | ORAL | 3 refills | Status: DC

## 2019-10-06 MED ORDER — SPIRONOLACTONE 25 MG PO TABS: 25.0000 mg | ORAL_TABLET | Freq: Every morning | ORAL | 3 refills | Status: DC

## 2019-10-07 ENCOUNTER — Other Ambulatory Visit: Payer: Self-pay

## 2019-10-07 MED ORDER — OMEPRAZOLE 20 MG PO CPDR: 20.0000 mg | DELAYED_RELEASE_CAPSULE | Freq: Every day | ORAL | 1 refills | Status: DC

## 2019-10-07 MED ORDER — LOSARTAN POTASSIUM 50 MG PO TABS: 50.0000 mg | ORAL_TABLET | Freq: Every day | ORAL | 1 refills | Status: DC

## 2019-10-07 MED ORDER — AMLODIPINE BESYLATE 10 MG PO TABS: 10.0000 mg | ORAL_TABLET | Freq: Every morning | ORAL | 1 refills | Status: DC

## 2019-11-04 ENCOUNTER — Other Ambulatory Visit: Payer: Self-pay | Admitting: Family Medicine

## 2019-11-07 ENCOUNTER — Encounter: Payer: Self-pay | Admitting: Family Medicine

## 2019-11-07 ENCOUNTER — Ambulatory Visit (INDEPENDENT_AMBULATORY_CARE_PROVIDER_SITE_OTHER): Payer: Medicare HMO | Admitting: Family Medicine

## 2019-11-07 ENCOUNTER — Other Ambulatory Visit: Payer: Self-pay

## 2019-11-07 VITALS — BP 124/80 | HR 85 | Temp 98.0°F | Resp 16 | Ht 62.0 in | Wt 241.0 lb

## 2019-11-07 DIAGNOSIS — E669 Obesity, unspecified: Secondary | ICD-10-CM

## 2019-11-07 DIAGNOSIS — M5136 Other intervertebral disc degeneration, lumbar region: Secondary | ICD-10-CM

## 2019-11-07 DIAGNOSIS — E7800 Pure hypercholesterolemia, unspecified: Secondary | ICD-10-CM

## 2019-11-07 DIAGNOSIS — E1169 Type 2 diabetes mellitus with other specified complication: Secondary | ICD-10-CM | POA: Diagnosis not present

## 2019-11-07 DIAGNOSIS — Z6841 Body Mass Index (BMI) 40.0 and over, adult: Secondary | ICD-10-CM | POA: Diagnosis not present

## 2019-11-07 DIAGNOSIS — E133299 Other specified diabetes mellitus with mild nonproliferative diabetic retinopathy without macular edema, unspecified eye: Secondary | ICD-10-CM | POA: Diagnosis not present

## 2019-11-07 DIAGNOSIS — E113299 Type 2 diabetes mellitus with mild nonproliferative diabetic retinopathy without macular edema, unspecified eye: Secondary | ICD-10-CM | POA: Diagnosis not present

## 2019-11-07 DIAGNOSIS — F411 Generalized anxiety disorder: Secondary | ICD-10-CM

## 2019-11-07 DIAGNOSIS — I10 Essential (primary) hypertension: Secondary | ICD-10-CM

## 2019-11-07 MED ORDER — METHOTREXATE 2.5 MG PO TABS: 10.0000 mg | ORAL_TABLET | ORAL | 0 refills | Status: DC

## 2019-11-07 MED ORDER — METFORMIN HCL 500 MG PO TABS: 500.0000 mg | ORAL_TABLET | Freq: Two times a day (BID) | ORAL | 3 refills | Status: DC

## 2019-11-07 MED ORDER — HYDROCODONE-ACETAMINOPHEN 5-325 MG PO TABS: 1.0000 | ORAL_TABLET | Freq: Four times a day (QID) | ORAL | 0 refills | Status: DC | PRN

## 2019-11-07 MED ORDER — ALPRAZOLAM 1 MG PO TABS: ORAL_TABLET | ORAL | 2 refills | Status: DC

## 2019-11-07 NOTE — Assessment & Plan Note (Signed)
Diabetes has been well controlled.  Her weight is down 5 pounds.  She has been trying to watch her nutrition more closely.  No change in medication we will recheck her labs today.  Goal is less than 7% closer to 6.5 will be best

## 2019-11-07 NOTE — Progress Notes (Signed)
   Subjective:    Patient ID: [name] [name], female    DOB: [birth year], 64 y.o.   MRN: [id number]  Patient presents for Diabetes  Patient here to follow-up chronic medical problems.  Her last visit was in September.  Medications reviewed  DM- last A1C A1c 6.2%  she is taking metformin 500mg  BID  CBG  116 this AM fasting, did not bring her meter   HTN- taking BP meds as prescribed   Hyperlipidemia- taking Crestor   GAD - she is on xanax as needed which also helps with sleep  Asthma, she has been doing well, using inhalers , no recent exacerbations   Obesity- weight down 5lbs since Sept   She is still followed by dermatology for her lichen planus . She is program, she is tapering off methothrexate   DDD Lumbar spine- needs norco refilled, last given in August 2020   She needs meds to go to [place]:  GEN- denies fatigue, fever, weight loss,weakness, recent illness HEENT- denies eye drainage, change in vision, nasal discharge, CVS- denies chest pain, palpitations RESP- denies SOB, cough, wheeze ABD- denies N/V, change in stools, abd pain GU- denies dysuria, hematuria, dribbling, incontinence MSK-+ joint pain, muscle aches, injury Neuro- denies headache, dizziness, syncope, seizure activity       Objective:    BP 124/80   Pulse 85   Temp 98 F (36.7 C) (Temporal)   Resp 16   Ht 5\' 2"  (1.575 m)   Wt 241 lb (109.3 kg)   LMP 06/03/2012   SpO2 97%   BMI 44.08 kg/m  GEN- NAD, alert and oriented x3 HEENT- PERRL, EOMI, non injected sclera, pink conjunctiva, MMM, oropharynx clear Neck- Supple, no thyromegaly CVS- RRR, no murmur RESP-CTAB ABD-NABS,soft,NT,ND EXT- No edema Pulses- Radial, DP- 2+        Assessment & Plan:      Problem List Items Addressed This Visit      Unprioritized   Anxiety state    Continue as needed alprazolam which she also uses to help her sleep.      Relevant Medications   ALPRAZolam (XANAX) 1 MG tablet    DDD (degenerative disc disease), lumbar    Your codon refilled.  She does not take this every day.      Relevant Medications   HYDROcodone-acetaminophen (NORCO) 5-325 MG tablet   methotrexate (RHEUMATREX) 2.5 MG tablet   Diabetes mellitus type 2 in obese ([place])    Diabetes has been well controlled.  Her weight is down 5 pounds.  She has been trying to watch her nutrition more closely.  No change in medication we will recheck her labs today.  Goal is less than 7% closer to 6.5 will be best      Relevant Orders   Hemoglobin A1c   Lipid panel   HTN, goal below 130/80 - Primary    Blood pressure well controlled no change in medication.      Relevant Orders   CBC with Differential/Platelet   Comprehensive metabolic panel   Hyperlipidemia   Relevant Orders   Lipid panel   Retinopathy due to secondary diabetes mellitus, without macular edema, with mild nonproliferative retinopathy ([place])      Note: This dictation was prepared with Dragon dictation along with smaller phrase technology. Any transcriptional errors that result from this process are unintentional.

## 2019-11-07 NOTE — Assessment & Plan Note (Signed)
Blood pressure well controlled no change in medication 

## 2019-11-07 NOTE — Patient Instructions (Addendum)
F/U 4 months for Physical   Give her fax me any forms [phone]

## 2019-11-07 NOTE — Assessment & Plan Note (Signed)
Your codon refilled.  She does not take this every day.

## 2019-11-07 NOTE — Assessment & Plan Note (Signed)
Continue as needed alprazolam which she also uses to help her sleep.

## 2019-11-08 LAB — CBC WITH DIFFERENTIAL/PLATELET
Absolute Monocytes: 624 cells/uL (ref 200–950)
Basophils Absolute: 47 cells/uL (ref 0–200)
Basophils Relative: 0.6 %
Eosinophils Absolute: 300 cells/uL (ref 15–500)
Eosinophils Relative: 3.8 %
HCT: 35.2 % (ref 35.0–45.0)
Hemoglobin: 11.4 g/dL — ABNORMAL LOW (ref 11.7–15.5)
Lymphs Abs: 1659 cells/uL (ref 850–3900)
MCH: 27.9 pg (ref 27.0–33.0)
MCHC: 32.4 g/dL (ref 32.0–36.0)
MCV: 86.1 fL (ref 80.0–100.0)
MPV: 9.5 fL (ref 7.5–12.5)
Monocytes Relative: 7.9 %
Neutro Abs: 5269 cells/uL (ref 1500–7800)
Neutrophils Relative %: 66.7 %
Platelets: 299 10*3/uL (ref 140–400)
RBC: 4.09 10*6/uL (ref 3.80–5.10)
RDW: 14.4 % (ref 11.0–15.0)
Total Lymphocyte: 21 %
WBC: 7.9 10*3/uL (ref 3.8–10.8)

## 2019-11-08 LAB — COMPREHENSIVE METABOLIC PANEL
AG Ratio: 1.2 (calc) (ref 1.0–2.5)
ALT: 9 U/L (ref 6–29)
AST: 16 U/L (ref 10–35)
Albumin: 4.1 g/dL (ref 3.6–5.1)
Alkaline phosphatase (APISO): 115 U/L (ref 37–153)
BUN: 15 mg/dL (ref 7–25)
CO2: 27 mmol/L (ref 20–32)
Calcium: 9.9 mg/dL (ref 8.6–10.4)
Chloride: 101 mmol/L (ref 98–110)
Creat: 0.94 mg/dL (ref 0.50–0.99)
Globulin: 3.5 g/dL (calc) (ref 1.9–3.7)
Glucose, Bld: 95 mg/dL (ref 65–99)
Potassium: 4.6 mmol/L (ref 3.5–5.3)
Sodium: 138 mmol/L (ref 135–146)
Total Bilirubin: 0.6 mg/dL (ref 0.2–1.2)
Total Protein: 7.6 g/dL (ref 6.1–8.1)

## 2019-11-08 LAB — LIPID PANEL
Cholesterol: 113 mg/dL (ref <200)
HDL: 45 mg/dL — ABNORMAL LOW (ref >=50)
LDL Cholesterol (Calc): 52 mg/dL (calc)
Non-HDL Cholesterol (Calc): 68 mg/dL (calc) (ref <130)
Total CHOL/HDL Ratio: 2.5 (calc) (ref <5.0)
Triglycerides: 80 mg/dL (ref <150)

## 2019-11-08 LAB — HEMOGLOBIN A1C
Hgb A1c MFr Bld: 6.6 % of total Hgb — ABNORMAL HIGH (ref <5.7)
Mean Plasma Glucose: 143 (calc)
eAG (mmol/L): 7.9 (calc)

## 2019-11-10 ENCOUNTER — Other Ambulatory Visit: Payer: Self-pay

## 2019-11-10 DIAGNOSIS — E1169 Type 2 diabetes mellitus with other specified complication: Secondary | ICD-10-CM

## 2019-11-10 DIAGNOSIS — E669 Obesity, unspecified: Secondary | ICD-10-CM

## 2019-11-10 MED ORDER — BLOOD GLUCOSE MONITORING SUPPL W/DEVICE KIT: PACK | 0 refills | Status: DC

## 2019-11-12 ENCOUNTER — Encounter: Payer: Self-pay | Admitting: *Deleted

## 2019-11-13 ENCOUNTER — Other Ambulatory Visit: Payer: Self-pay | Admitting: *Deleted

## 2019-11-13 DIAGNOSIS — E669 Obesity, unspecified: Secondary | ICD-10-CM

## 2019-11-13 DIAGNOSIS — E1169 Type 2 diabetes mellitus with other specified complication: Secondary | ICD-10-CM

## 2019-11-13 MED ORDER — BLOOD GLUCOSE MONITORING SUPPL W/DEVICE KIT: PACK | 0 refills | Status: DC

## 2019-11-13 MED ORDER — ACCU-CHEK AVIVA PLUS VI STRP: ORAL_STRIP | 12 refills | Status: DC

## 2019-11-23 ENCOUNTER — Other Ambulatory Visit: Payer: Self-pay | Admitting: Family Medicine

## 2019-12-14 ENCOUNTER — Ambulatory Visit: Payer: Medicare HMO | Attending: Internal Medicine

## 2019-12-14 DIAGNOSIS — Z23 Encounter for immunization: Secondary | ICD-10-CM

## 2019-12-14 NOTE — Progress Notes (Signed)
   Covid-19 Vaccination Clinic  Name:  [name] [name]    MRN: [id number] DOB: 1956-03-30  12/14/2019  [name] was observed post Covid-19 immunization for 15 minutes without incidence. She was provided with Vaccine Information Sheet and instruction to access the V-Safe system.   [name] was instructed to call 911 with any severe reactions post vaccine: Marland Kitchen Difficulty breathing  . Swelling of your face and throat  . A fast heartbeat  . A bad rash all over your body  . Dizziness and weakness    Immunizations Administered    Name Date Dose VIS Date Route   Moderna COVID-19 Vaccine 12/14/2019  1:54 PM 0.5 mL 09/16/2019 Intramuscular   Manufacturer: Moderna   Lot: [id number]   [id number]

## 2019-12-30 DIAGNOSIS — L438 Other lichen planus: Secondary | ICD-10-CM | POA: Diagnosis not present

## 2019-12-30 DIAGNOSIS — Z79899 Other long term (current) drug therapy: Secondary | ICD-10-CM | POA: Diagnosis not present

## 2019-12-30 DIAGNOSIS — L439 Lichen planus, unspecified: Secondary | ICD-10-CM | POA: Diagnosis not present

## 2019-12-30 DIAGNOSIS — Z5181 Encounter for therapeutic drug level monitoring: Secondary | ICD-10-CM | POA: Diagnosis not present

## 2020-01-17 ENCOUNTER — Ambulatory Visit: Payer: Medicare HMO | Attending: Internal Medicine

## 2020-01-17 DIAGNOSIS — Z23 Encounter for immunization: Secondary | ICD-10-CM

## 2020-01-17 NOTE — Progress Notes (Signed)
   Covid-19 Vaccination Clinic  Name:  [name] [name]    MRN: [id number] DOB: 1956-09-22  01/17/2020  [name] [name] was observed post Covid-19 immunization for 15 minutes without incident. She was provided with Vaccine Information Sheet and instruction to access the V-Safe system.   [name] [name] was instructed to call 911 with any severe reactions post vaccine: Marland Kitchen Difficulty breathing  . Swelling of face and throat  . A fast heartbeat  . A bad rash all over body  . Dizziness and weakness   Immunizations Administered    Name Date Dose VIS Date Route   Moderna COVID-19 Vaccine 01/17/2020  1:42 PM 0.5 mL 09/16/2019 Intramuscular   Manufacturer: Moderna   Lot: [id number]   [id number]

## 2020-01-23 ENCOUNTER — Other Ambulatory Visit: Payer: Self-pay | Admitting: Family Medicine

## 2020-01-23 MED ORDER — HYDROCODONE-ACETAMINOPHEN 5-325 MG PO TABS: 1.0000 | ORAL_TABLET | Freq: Four times a day (QID) | ORAL | 0 refills | Status: DC | PRN

## 2020-01-23 NOTE — Telephone Encounter (Signed)
Patient needs refill on hydrocodone for knee pain  [facility] [name] [name]

## 2020-01-23 NOTE — Telephone Encounter (Signed)
Ok to refill??  Last office visit/ refill 11/07/2019.

## 2020-02-10 DIAGNOSIS — H7202 Central perforation of tympanic membrane, left ear: Secondary | ICD-10-CM | POA: Diagnosis not present

## 2020-02-10 DIAGNOSIS — H6122 Impacted cerumen, left ear: Secondary | ICD-10-CM | POA: Diagnosis not present

## 2020-02-10 DIAGNOSIS — K219 Gastro-esophageal reflux disease without esophagitis: Secondary | ICD-10-CM | POA: Diagnosis not present

## 2020-02-10 DIAGNOSIS — R490 Dysphonia: Secondary | ICD-10-CM | POA: Diagnosis not present

## 2020-02-20 DIAGNOSIS — M25562 Pain in left knee: Secondary | ICD-10-CM | POA: Diagnosis not present

## 2020-02-26 ENCOUNTER — Telehealth: Payer: Self-pay | Admitting: Family Medicine

## 2020-02-26 NOTE — Chronic Care Management (AMB) (Signed)
  Chronic Care Management   Outreach Note  02/26/2020 Name: [name] [name] MRN: [id number] DOB: Sep 01, 1956  Referred by: Alycia Rossetti, MD Reason for referral : Chronic Care Management   An unsuccessful telephone outreach was attempted today. The patient was referred to the pharmacist for assistance with care management and care coordination.   Follow Up Plan:   [place]

## 2020-03-08 ENCOUNTER — Other Ambulatory Visit: Payer: Self-pay

## 2020-03-08 ENCOUNTER — Ambulatory Visit (INDEPENDENT_AMBULATORY_CARE_PROVIDER_SITE_OTHER): Payer: Medicare HMO | Admitting: Family Medicine

## 2020-03-08 ENCOUNTER — Encounter: Payer: Self-pay | Admitting: Family Medicine

## 2020-03-08 VITALS — BP 124/60 | HR 78 | Temp 98.1°F | Resp 14 | Ht 62.0 in | Wt 237.0 lb

## 2020-03-08 DIAGNOSIS — Z0001 Encounter for general adult medical examination with abnormal findings: Secondary | ICD-10-CM

## 2020-03-08 DIAGNOSIS — F411 Generalized anxiety disorder: Secondary | ICD-10-CM

## 2020-03-08 DIAGNOSIS — Z13820 Encounter for screening for osteoporosis: Secondary | ICD-10-CM

## 2020-03-08 DIAGNOSIS — D649 Anemia, unspecified: Secondary | ICD-10-CM | POA: Diagnosis not present

## 2020-03-08 DIAGNOSIS — I10 Essential (primary) hypertension: Secondary | ICD-10-CM

## 2020-03-08 DIAGNOSIS — E669 Obesity, unspecified: Secondary | ICD-10-CM

## 2020-03-08 DIAGNOSIS — E1169 Type 2 diabetes mellitus with other specified complication: Secondary | ICD-10-CM | POA: Diagnosis not present

## 2020-03-08 DIAGNOSIS — E7800 Pure hypercholesterolemia, unspecified: Secondary | ICD-10-CM

## 2020-03-08 DIAGNOSIS — E133299 Other specified diabetes mellitus with mild nonproliferative diabetic retinopathy without macular edema, unspecified eye: Secondary | ICD-10-CM | POA: Diagnosis not present

## 2020-03-08 DIAGNOSIS — N951 Menopausal and female climacteric states: Secondary | ICD-10-CM | POA: Diagnosis not present

## 2020-03-08 DIAGNOSIS — Z0000 Encounter for general adult medical examination without abnormal findings: Secondary | ICD-10-CM

## 2020-03-08 MED ORDER — HYDROCODONE-ACETAMINOPHEN 5-325 MG PO TABS: 1.0000 | ORAL_TABLET | Freq: Four times a day (QID) | ORAL | 0 refills | Status: DC | PRN

## 2020-03-08 MED ORDER — METHOTREXATE 2.5 MG PO TABS: 7.5000 mg | ORAL_TABLET | ORAL | 0 refills | Status: AC

## 2020-03-08 MED ORDER — ALPRAZOLAM 1 MG PO TABS: ORAL_TABLET | ORAL | 2 refills | Status: DC

## 2020-03-08 NOTE — Progress Notes (Signed)
Subjective:   Patient presents for Medicare Annual/Subsequent preventive examination.    DM- last A1C A1c 6.6%  she is taking metformin 500mg  BID CBG  107 this AM fasting, did not bring her meter   HTN- taking BP meds as prescribed , norvasc and losartan   Hyperlipidemia- taking Crestor at bedtime   GAD - she is on xanax as needed which also helps with sleep, request refill. Feels like nerves are controlled   Asthma, she has been doing well, using inhalers , no recent exacerbations    She is still followed by dermatology - Dr. Shiela Mayer  for her lichen planus . She is program, she is tapering off methothrexate now on 3 tabs a day, she is not sure if she taking the Aciretin or the tacrolimus but states she takes an extra capsule   DDD Lumbar spine/OA of knee - needs norco refilled, she had shot done in her left knee by [facility], that has helped  Weight down 4lbs since Jan  Constipation- prn miralax and linzess   Iron def anemia- taking iron tablets every other day   Peripheral edema- takes lasix  as needed    Review Past Medical/Family/Social: per EMR    Risk Factors  Current exercise habits: none  Dietary issues discussed: Yes  Cardiac risk factors: Obesity (BMI >= 30 kg/m2). DM, HTN, Hyperlipidemia   Depression Screen  (Note: if answer to either of the following is "Yes", a more complete depression screening is indicated)  Over the past two weeks, have you felt down, depressed or hopeless? No Over the past two weeks, have you felt little interest or pleasure in doing things? No Have you lost interest or pleasure in daily life? No Do you often feel hopeless? No Do you cry easily over simple problems? No   Activities of Daily Living  In your present state of health, do you have any difficulty performing the following activities?:  Driving? No  Managing money? No  Feeding yourself? No  Getting from bed to chair? No  Climbing a flight of stairs? No   Preparing food and eating?: No  Bathing or showering? No  Getting dressed: No  Getting to the toilet? No  Using the toilet:No  Moving around from place to place: No  In the past year have you fallen or had a near fall?:No  Are you sexually active? No  Do you have more than one partner? No   Hearing Difficulties: No  Do you often ask people to speak up or repeat themselves? No  Do you experience ringing or noises in your ears? No Do you have difficulty understanding soft or whispered voices? No  Do you feel that you have a problem with memory? No Do you often misplace items? No  Do you feel safe at home? Yes  Cognitive Testing  Alert? Yes Normal Appearance?Yes  Oriented to person? Yes Place? Yes  Time? Yes  Recall of three objects? Yes  Can perform simple calculations? Yes  Displays appropriate judgment?Yes  Can read the correct time from a watch face?Yes   List the Names of Other Physician/Practitioners you currently use:  Dr. Shiela Mayer- Dermatology  Dr. Jorja Loa- Eye doctor     Screening Tests / Date Colonoscopy          UTD 2013            Shingrix UTD  Mammogram UTD oct  Influenza Vaccine  UTD Tetanus/tdap UTD Covid-19 Vaccine UTD  Bone Density- Due  ROS: GEN- denies fatigue, fever, weight loss,weakness, recent illness HEENT- denies eye drainage, change in vision, nasal discharge, CVS- denies chest pain, palpitations RESP- denies SOB, cough, wheeze ABD- denies N/V, change in stools, abd pain GU- denies dysuria, hematuria, dribbling, incontinence MSK- denies joint pain, muscle aches, injury Neuro- denies headache, dizziness, syncope, seizure activity  PHYSICAL: vitals reviewed  GEN- NAD, alert and oriented x3 HEENT- PERRL, EOMI, non injected sclera, pink conjunctiva, MMM, oropharynx clear Neck- Supple, no thryomegaly CVS- RRR, no murmur RESP-CTAB EXT- No edema Psych- normal affect and mood  Pulses- Radial, DP- 2+   Assessment:    Annual wellness  medicare exam   Plan:    During the course of the visit the patient was educated and counseled about appropriate screening and preventive services including:   Bone Density- pt to schedule, she will be  64 next month  Fasting labs obtained  Immunizations UTD  HTn controlled  DM with retinopathy- controlled on metformin 500mg  BID  Hyperlipidemia- LDL at goal on Crestor, continue reducing carb, sugary beverages, fried foods  OA knee/ chronic pain DDD lumbar spine- naprosyn,robaxin  and norco prn. Trying to stay physically active but difficult due to knee issues  Peripheral edema controlled with duiretic prn, has aldactone daily for BP control  GAD/insomnia- refilled xanax   Lichen planuus- pt has appt next month, she will clarify meds    GERD- uses prn prilosec            Diet review for nutrition referral? Yes ____ Not Indicated __x__  Patient Instructions (the written plan) was given to the patient.  Medicare Attestation  I have personally reviewed:  The patient's medical and social history  Their use of alcohol, tobacco or illicit drugs  Their current medications and supplements  The patient's functional ability including ADLs,fall risks, home safety risks, cognitive, and hearing and visual impairment  Diet and physical activities  Evidence for depression or mood disorders  The patient's weight, height, BMI, and visual acuity have been recorded in the chart. I have made referrals, counseling, and provided education to the patient based on review of the above and I have provided the patient with a written personalized care plan for preventive services.

## 2020-03-08 NOTE — Patient Instructions (Addendum)
[facility] [place] check them for glasses  We will call with lab results Bone Density schedule [facility] F/U 4 months

## 2020-03-09 ENCOUNTER — Encounter: Payer: Self-pay | Admitting: Family Medicine

## 2020-03-09 ENCOUNTER — Encounter: Payer: Medicare HMO | Admitting: Family Medicine

## 2020-03-09 LAB — MICROALBUMIN / CREATININE URINE RATIO
Creatinine, Urine: 178 mg/dL (ref 20–275)
Microalb Creat Ratio: 4 mcg/mg creat (ref <30)
Microalb, Ur: 0.8 mg/dL

## 2020-03-10 LAB — COMPREHENSIVE METABOLIC PANEL
AG Ratio: 1.2 (calc) (ref 1.0–2.5)
ALT: 10 U/L (ref 6–29)
AST: 17 U/L (ref 10–35)
Albumin: 4 g/dL (ref 3.6–5.1)
Alkaline phosphatase (APISO): 122 U/L (ref 37–153)
BUN: 12 mg/dL (ref 7–25)
CO2: 30 mmol/L (ref 20–32)
Calcium: 9.6 mg/dL (ref 8.6–10.4)
Chloride: 102 mmol/L (ref 98–110)
Creat: 0.85 mg/dL (ref 0.50–0.99)
Globulin: 3.3 g/dL (calc) (ref 1.9–3.7)
Glucose, Bld: 90 mg/dL (ref 65–99)
Potassium: 4.1 mmol/L (ref 3.5–5.3)
Sodium: 139 mmol/L (ref 135–146)
Total Bilirubin: 0.6 mg/dL (ref 0.2–1.2)
Total Protein: 7.3 g/dL (ref 6.1–8.1)

## 2020-03-10 LAB — HEMOGLOBIN A1C
Hgb A1c MFr Bld: 6.4 % of total Hgb — ABNORMAL HIGH (ref <5.7)
Mean Plasma Glucose: 137 (calc)
eAG (mmol/L): 7.6 (calc)

## 2020-03-10 LAB — CBC WITH DIFFERENTIAL/PLATELET
Absolute Monocytes: 616 cells/uL (ref 200–950)
Basophils Absolute: 53 cells/uL (ref 0–200)
Basophils Relative: 0.7 %
Eosinophils Absolute: 167 cells/uL (ref 15–500)
Eosinophils Relative: 2.2 %
HCT: 34.8 % — ABNORMAL LOW (ref 35.0–45.0)
Hemoglobin: 11 g/dL — ABNORMAL LOW (ref 11.7–15.5)
Lymphs Abs: 1664 cells/uL (ref 850–3900)
MCH: 27.4 pg (ref 27.0–33.0)
MCHC: 31.6 g/dL — ABNORMAL LOW (ref 32.0–36.0)
MCV: 86.8 fL (ref 80.0–100.0)
MPV: 9.4 fL (ref 7.5–12.5)
Monocytes Relative: 8.1 %
Neutro Abs: 5100 cells/uL (ref 1500–7800)
Neutrophils Relative %: 67.1 %
Platelets: 308 10*3/uL (ref 140–400)
RBC: 4.01 10*6/uL (ref 3.80–5.10)
RDW: 14.4 % (ref 11.0–15.0)
Total Lymphocyte: 21.9 %
WBC: 7.6 10*3/uL (ref 3.8–10.8)

## 2020-03-10 LAB — IRON,TIBC AND FERRITIN PANEL
%SAT: 13 % (calc) — ABNORMAL LOW (ref 16–45)
Ferritin: 86 ng/mL (ref 16–288)
Iron: 37 ug/dL — ABNORMAL LOW (ref 45–160)
TIBC: 295 mcg/dL (calc) (ref 250–450)

## 2020-03-10 LAB — TEST AUTHORIZATION

## 2020-03-16 ENCOUNTER — Other Ambulatory Visit: Payer: Self-pay | Admitting: Family Medicine

## 2020-03-20 ENCOUNTER — Other Ambulatory Visit: Payer: Self-pay | Admitting: Family Medicine

## 2020-03-24 ENCOUNTER — Telehealth: Payer: Self-pay | Admitting: Family Medicine

## 2020-03-24 NOTE — Progress Notes (Signed)
°  Chronic Care Management   Note  03/24/2020 Name: PIYA MESCH MRN: [id number] DOB: [birth year]  Venita Sheffield Steagall is a 64 y.o. year old female who is a primary care patient of Adams, Modena Nunnery, MD. I reached out to [employer] by phone today in response to a referral sent by Ms. Venita Sheffield Threat's PCP, Burt, Modena Nunnery, MD.   Ms. Haecker was given information about Chronic Care Management services today including:  1. CCM service includes personalized support from designated clinical staff supervised by her physician, including individualized plan of care and coordination with other care providers 2. 24/7 contact phone numbers for assistance for urgent and routine care needs. 3. Service will only be billed when office clinical staff spend 20 minutes or more in a month to coordinate care. 4. Only one practitioner may furnish and bill the service in a calendar month. 5. The patient may stop CCM services at any time (effective at the end of the month) by phone call to the office staff.   Patient agreed to services and verbal consent obtained.   Follow up plan:  [place]

## 2020-04-06 DIAGNOSIS — L439 Lichen planus, unspecified: Secondary | ICD-10-CM | POA: Diagnosis not present

## 2020-04-06 DIAGNOSIS — L438 Other lichen planus: Secondary | ICD-10-CM | POA: Diagnosis not present

## 2020-04-06 DIAGNOSIS — Z79899 Other long term (current) drug therapy: Secondary | ICD-10-CM | POA: Diagnosis not present

## 2020-04-06 DIAGNOSIS — Z5181 Encounter for therapeutic drug level monitoring: Secondary | ICD-10-CM | POA: Diagnosis not present

## 2020-04-12 ENCOUNTER — Other Ambulatory Visit: Payer: Self-pay

## 2020-04-12 ENCOUNTER — Ambulatory Visit (HOSPITAL_COMMUNITY)
Admission: RE | Admit: 2020-04-12 | Discharge: 2020-04-12 | Disposition: A | Discharge status: Home / Self Care | Payer: Medicare HMO | Source: Ambulatory Visit | Attending: Family Medicine | Admitting: Family Medicine

## 2020-04-12 DIAGNOSIS — N951 Menopausal and female climacteric states: Secondary | ICD-10-CM | POA: Insufficient documentation

## 2020-04-12 DIAGNOSIS — Z13820 Encounter for screening for osteoporosis: Secondary | ICD-10-CM

## 2020-04-14 ENCOUNTER — Other Ambulatory Visit: Payer: Self-pay | Admitting: Family Medicine

## 2020-04-14 ENCOUNTER — Encounter: Payer: Self-pay | Admitting: *Deleted

## 2020-04-23 DIAGNOSIS — Z0100 Encounter for examination of eyes and vision without abnormal findings: Secondary | ICD-10-CM | POA: Diagnosis not present

## 2020-05-19 ENCOUNTER — Ambulatory Visit: Payer: Medicare HMO | Admitting: Pharmacist

## 2020-05-19 DIAGNOSIS — E669 Obesity, unspecified: Secondary | ICD-10-CM

## 2020-05-19 DIAGNOSIS — E1169 Type 2 diabetes mellitus with other specified complication: Secondary | ICD-10-CM

## 2020-05-19 DIAGNOSIS — E7800 Pure hypercholesterolemia, unspecified: Secondary | ICD-10-CM

## 2020-05-19 DIAGNOSIS — I10 Essential (primary) hypertension: Secondary | ICD-10-CM

## 2020-05-19 DIAGNOSIS — K219 Gastro-esophageal reflux disease without esophagitis: Secondary | ICD-10-CM

## 2020-05-19 NOTE — Chronic Care Management (AMB) (Signed)
Chronic Care Management Pharmacy  Name: [name] [name]  MRN: [id number] DOB: 04-09-1956  Chief Complaint/ HPI  [name] [name],  64 y.o. , female presents for their Initial CCM visit with the clinical pharmacist via telephone.  PCP : [name] Rossetti, MD  Their chronic conditions include: HTN, GERD, allergic rhinitis, Type II DM, hyperlipidemia, anxiety.  Office Visits: 03/08/2020 [facility]) - Routine medical exan  Patient was to schedule bone density exam  Patient had mild anemia, iron was increased to daily '325mg'$   11/07/2019 [facility]) -   No changes to medications, all chronic conditions controlled  Consult Visit: 04/06/2020 [facility]) -   Tacrolimus swish for mouth lesion  Clobetasol/Lidex given to use prn  Medications: Outpatient Encounter Medications as of 05/19/2020  Medication Sig  . Accu-Chek FastClix Lancets MISC TEST FASTING BLOOD SUGAR TWICE PER DAY  . albuterol (VENTOLIN HFA) 108 (90 Base) MCG/ACT inhaler INHALE 2 PUFFS INTO THE LUNGS EVERY 4 HOURS AS NEEDED FOR WHEEZING OR SHORTNESS OF BREATH  . Alcohol Swabs (B-D SINGLE USE SWABS REGULAR) PADS USE TWICE DAILY  . ALPRAZolam (XANAX) 1 MG tablet TAKE 1 TABLET(1 MG) BY MOUTH TWICE DAILY AS NEEDED  . amLODipine (NORVASC) 10 MG tablet TAKE 1 TABLET EVERY MORNING  . aspirin 325 MG tablet Take 325 mg by mouth every morning.   . Blood Glucose Monitoring Suppl w/Device KIT TEST FASTING BLOOD SUGAR TWICE PER DAY DX; E11.9  . Cholecalciferol (VITAMIN D-3) 1000 UNITS CAPS Take by mouth.  . clobetasol ointment (TEMOVATE) 0.05 % APPLY IN SORE AREAS OF MOUTH BID. NOT ON FACE/GROIN  . co-enzyme Q-10 30 MG capsule Take 1 capsule (30 mg total) by mouth 3 (three) times daily.  . famotidine (PEPCID) 40 MG tablet Take 40 mg by mouth daily.  . fluocinonide ointment (LIDEX) 0.05 % Apply to affected areas of body twice daily. Never on face/groin  . fluticasone (FLONASE) 50 MCG/ACT nasal spray USE 2 SPRAYS INTO BOTH NOSTRILS  DAILY.  . furosemide (LASIX) 20 MG tablet TAKE 1 TABLET (20 MG TOTAL) BY MOUTH DAILY AS NEEDED.  Marland Kitchen glucose blood (ACCU-CHEK AVIVA PLUS) test strip Use as instructed to monitor FSBS 2x daily. Dx: E11.9  . HYDROcodone-acetaminophen (NORCO) 5-325 MG tablet Take 1 tablet by mouth every 6 (six) hours as needed for moderate pain.  Elmore Guise Devices (ADJUSTABLE LANCING DEVICE) MISC TEST FASTING BLOOD SUGAR TWICE PER DAY DX; E11.9  . lidocaine (XYLOCAINE) 2 % solution Use as directed 5-10 mLs in the mouth or throat 2 (two) times daily.  Marland Kitchen LINZESS 145 MCG CAPS capsule TAKE 1 CAPSULE (145 MCG TOTAL) BY MOUTH DAILY BEFORE BREAKFAST.  Marland Kitchen losartan (COZAAR) 50 MG tablet TAKE 1 TABLET EVERY DAY  . metFORMIN (GLUCOPHAGE) 500 MG tablet Take 1 tablet (500 mg total) by mouth 2 (two) times daily with a meal.  . methotrexate (RHEUMATREX) 2.5 MG tablet Take 3 tablets (7.5 mg total) by mouth once a week. Caution:Chemotherapy. Protect from light.  . Multiple Vitamins-Minerals (CENTRUM ADULTS PO) Take by mouth.  . naproxen (NAPROSYN) 500 MG tablet TAKE 1 TABLET TWICE DAILY WITH MEALS  . omeprazole (PRILOSEC) 20 MG capsule TAKE 1 CAPSULE EVERY DAY  . polyethylene glycol powder (GLYCOLAX/MIRALAX) powder MIX 1 CAPFUL (17GM) IN 8 OUNCES OF WATER AND DRINK EVERY DAY  . rosuvastatin (CRESTOR) 10 MG tablet Take 1 tablet (10 mg total) by mouth at bedtime.  Marland Kitchen spironolactone (ALDACTONE) 25 MG tablet Take 1 tablet (25 mg total) by mouth every  morning.  . tacrolimus (PROGRAF) 1 MG capsule Take by mouth.  . triamcinolone cream (KENALOG) 0.1 % APPLY TOPICALLY TO VULVA 2 TIMES DAILY  . vitamin B-12 (CYANOCOBALAMIN) 1000 MCG tablet Take 1 tablet (1,000 mcg total) by mouth daily.  Marland Kitchen acitretin (SORIATANE) 25 MG capsule Take 25 mg by mouth daily. (Patient not taking: Reported on 05/19/2020)  . meclizine (ANTIVERT) 12.5 MG tablet Take 1 tablet (12.5 mg total) by mouth 3 (three) times daily as needed for dizziness. (Patient not taking: Reported  on 05/19/2020)  . methocarbamol (ROBAXIN) 500 MG tablet Take 1 tablet (500 mg total) by mouth every 6 (six) hours as needed for muscle spasms. (Patient not taking: Reported on 05/19/2020)   No facility-administered encounter medications on file as of 05/19/2020.     Current Diagnosis/Assessment:   Emergency planning/management officer Strain: Low Risk   . Difficulty of Paying Living Expenses: Not very hard    Goals Addressed            This Visit's Progress   . Pharmacy Care Plan:       CARE PLAN ENTRY (see longitudinal plan of care for additional care plan information)  Current Barriers:  . Chronic Disease Management support, education, and care coordination needs related to Hypertension, Hyperlipidemia, Diabetes, and GERD   Hypertension BP Readings from Last 3 Encounters:  03/08/20 124/60  11/07/19 124/80  07/08/19 130/74   . Pharmacist Clinical Goal(s): o Over the next 180 days, patient will work with PharmD and providers to maintain BP goal <130/80 . Current regimen:  o Amlodipine '10mg'$  daily o Losartan '50mg'$  daily o Spironolactone '25mg'$  daily . Interventions: o Reviewed home monitoring of BP o Discussed adverse effects of medication . Patient self care activities - Over the next 180 days, patient will: o Check BP daily, document, and provide at future appointments o Ensure daily salt intake < 2300 mg/day  Hyperlipidemia Lab Results  Component Value Date/Time   LDLCALC 52 11/07/2019 02:21 PM   . Pharmacist Clinical Goal(s): o Over the next 180 days, patient will work with PharmD and providers to maintain LDL goal < 70 . Current regimen:  o Rosuvastatin '10mg'$  daily . Interventions: o Reviewed most recent lipid panel . Patient self care activities - Over the next 180 days, patient will: o Continue to focus on medication adherence by pill count o Continue to work on healthy diet low in fats and sugar  Diabetes Lab Results  Component Value Date/Time   HGBA1C 6.4 (H) 03/08/2020 03:52  PM   HGBA1C 6.6 (H) 11/07/2019 02:21 PM   . Pharmacist Clinical Goal(s): o Over the next 180 days, patient will work with PharmD and providers to maintain A1c goal <7% . Current regimen:  o Metformin '500mg'$  twice daily . Interventions: o Reviewed home blood sugar readings o Discussed ways to treat any occurring hypoglycemia o Discussed diabetic diet . Patient self care activities - Over the next 180 days, patient will: o Check blood sugar once daily, document, and provide at future appointments o Contact provider with any episodes of hypoglycemia  GERD . Pharmacist Clinical Goal(s) o Over the next 180 days, patient will work with PharmD and providers to optimize medication and minimize symptoms related to GERD. . Current regimen:  o Omeprazole '20mg'$  daily o Famotidine '40mg'$  daily . Interventions: o Discussed frequency of acid reflux symptoms o Counseled on long term effects of PPI. o Recommend taper of PPI therapy. . Patient self care activities - Over the next 180 days,  patient will: o Continue to take medication until told otherwise by provider.   Initial goal documentation        Diabetes   Recent Relevant Labs: Lab Results  Component Value Date/Time   HGBA1C 6.4 (H) 03/08/2020 03:52 PM   HGBA1C 6.6 (H) 11/07/2019 02:21 PM   MICROALBUR 0.8 03/08/2020 03:52 PM   MICROALBUR 1.0 06/20/2018 02:45 PM     Checking BG: Rarely  Recent FBG Readings: 114-130 No other readings reported Patient is currently controlled on the following medications:   Metformin '500mg'$  twice daily  Last diabetic Foot exam:  Lab Results  Component Value Date/Time   HMDIABEYEEXA Retinopathy (A) 10/24/2018 12:00 AM    Last diabetic Eye exam:  Lab Results  Component Value Date/Time   HMDIABFOOTEX yes  11/03/2010 12:00 AM     We discussed: how to recognize and treat signs of hypoglycemia.  She reports no recent episodes of hypoglycemia.  Working on diet that is low in carbohydrates and  sugary foods.  No issues with metformin and reports compliance with twice daily dosing.  Plan  Continue current medications and control with diet and exercise,  Hypertension    Office blood pressures are  BP Readings from Last 3 Encounters:  03/08/20 124/60  11/07/19 124/80  07/08/19 130/74    Patient has failed these meds in the past: none noted  Patient checks BP at home infrequently  Patient home BP readings are ranging: no specific readings  Patient is currently controlled on the following medications:   Amlodipine '10mg'$   Losartan '50mg'$   Spironolactone '25mg'$  daily  BP in office has been excellent.  She reports no s/sx of hypotension, does have access to cuff at home.  Mentions slight swelling, resolved when she takes Lasix.  Plan  Continue current medications     Hyperlipidemia   LDL goal < 70  Lipid Panel     Component Value Date/Time   CHOL 113 11/07/2019 1421   TRIG 80 11/07/2019 1421   HDL 45 (L) 11/07/2019 1421   LDLCALC 52 11/07/2019 1421    Hepatic Function Latest Ref Rng & Units 03/08/2020 11/07/2019 07/08/2019  Total Protein 6.1 - 8.1 g/dL 7.3 7.6 7.7  Albumin 3.6 - 5.1 g/dL - - -  AST 10 - 35 U/L '17 16 16  '$ ALT 6 - 29 U/L '10 9 9  '$ Alk Phosphatase 33 - 130 U/L - - -  Total Bilirubin 0.2 - 1.2 mg/dL 0.6 0.6 0.4  Bilirubin, Direct 0.0 - 0.3 mg/dL - - -     The ASCVD Risk score ([place]., et al., 2013) failed to calculate for the following reasons:   The valid total cholesterol range is 130 to 320 mg/dL   Patient has failed these meds in past: none noted Patient is currently controlled on the following medications:  . Rosuvastatin '10mg'$  daily  We discussed:  Reports compliance with night time dosing, no myalgias.  LDL excellent in Jan.  Patient continues to work on diet.  Plan  Continue current medications  GERD    Patient has failed these meds in past: none noted Patient is currently controlled on the following medications:    Omeprazole '20mg'$   Famotidine '40mg'$   We discussed:  Patient also taking Famotidine '40mg'$  hs per ENT Dr. Benjamine Mola.  Has been on PPI for number of years.  Patient Dr. Benjamine Mola has mentioned to her before that he would recommend trying to stop either of her acid pills.  Discussed tapering off of  Omeprazole and trying to take Famotidine '20mg'$  am and '20mg'$  pm.  Patient agreeable, will consult with Dr. Buelah Manis on this plan.  Plan  Recommend taper of omeprazole '20mg'$  and initiation of Famotidine '20mg'$  twice daily prescribed by Dr. Benjamine Mola, will consult with PCP.   Anxiety   Patient has failed these meds in past: none noted Patient is currently controlled on the following medications:  . Alprazolam '1mg'$  twice daily  Patient stated she is currently taking this to sleep almost nightly.  Patient is taking care of her mother who is showing signs of dementia and this helps her to fall asleep.  Only using once daily.  Plan  Continue current medications   Allergic Rhinitis   Patient has failed these meds in past: none noted Patient is currently controlled on the following medications:  . Fluticasone 13mg nasal spray  Allergy symptoms controlled with prn Flonase.  Plan  Continue current medications  Vaccines   Reviewed and discussed patient's vaccination history.    Immunization History  Administered Date(s) Administered  . Influenza Whole 10/16/2004, 08/20/2006, 08/22/2007, 08/11/2008, 07/20/2010, 06/28/2011  . Influenza,inj,Quad PF,6+ Mos 07/22/2014, 08/27/2015, 09/11/2016, 08/14/2017, 06/20/2018, 06/04/2019  . Influenza-Unspecified 09/15/2013  . Moderna SARS-COVID-2 Vaccination 12/14/2019, 01/17/2020  . Pneumococcal Polysaccharide-23 03/21/2010  . Td 03/21/2010  . Tdap 03/21/2010  . Zoster Recombinat (Shingrix) 07/08/2019, 07/08/2019, 09/02/2019    Plan  Recommended patient receive Tdap vaccine in office.  Medication Management   . Miscellaneous medications:  o Furosemide  '20mg'$  o Methotrexate 2.5 o Prograf '1mg'$  o Norco 5-'325mg'$  o Linzess 145 mcg . OTC's:  o ASA '325mg'$  o MVI o Vitamin b-12 . [place] [facility]  Phone #  [phone]. Patient reports using no specific method to organize medications and promote adherence. . Patient denies missed doses of medication.   CBeverly Milch PharmD Clinical Pharmacist [place]([phone]

## 2020-05-19 NOTE — Patient Instructions (Addendum)
Visit Information Thank you for meeting with me today!  I look forward to working with you to help you meet all of your healthcare goals and answer any questions you may have.  Feel free to contact me anytime!  Goals Addressed            This Visit's Progress   . Pharmacy Care Plan:       CARE PLAN ENTRY (see longitudinal plan of care for additional care plan information)  Current Barriers:  . Chronic Disease Management support, education, and care coordination needs related to Hypertension, Hyperlipidemia, Diabetes, and GERD   Hypertension BP Readings from Last 3 Encounters:  03/08/20 124/60  11/07/19 124/80  07/08/19 130/74   . Pharmacist Clinical Goal(s): o Over the next 180 days, patient will work with PharmD and providers to maintain BP goal <130/80 . Current regimen:  o Amlodipine 10mg  daily o Losartan 50mg  daily o Spironolactone 25mg  daily . Interventions: o Reviewed home monitoring of BP o Discussed adverse effects of medication . Patient self care activities - Over the next 180 days, patient will: o Check BP daily, document, and provide at future appointments o Ensure daily salt intake < 2300 mg/day  Hyperlipidemia Lab Results  Component Value Date/Time   LDLCALC 52 11/07/2019 02:21 PM   . Pharmacist Clinical Goal(s): o Over the next 180 days, patient will work with PharmD and providers to maintain LDL goal < 70 . Current regimen:  o Rosuvastatin 10mg  daily . Interventions: o Reviewed most recent lipid panel . Patient self care activities - Over the next 180 days, patient will: o Continue to focus on medication adherence by pill count o Continue to work on healthy diet low in fats and sugar  Diabetes Lab Results  Component Value Date/Time   HGBA1C 6.4 (H) 03/08/2020 03:52 PM   HGBA1C 6.6 (H) 11/07/2019 02:21 PM   . Pharmacist Clinical Goal(s): o Over the next 180 days, patient will work with PharmD and providers to maintain A1c goal <7% . Current  regimen:  o Metformin 500mg  twice daily . Interventions: o Reviewed home blood sugar readings o Discussed ways to treat any occurring hypoglycemia o Discussed diabetic diet . Patient self care activities - Over the next 180 days, patient will: o Check blood sugar once daily, document, and provide at future appointments o Contact provider with any episodes of hypoglycemia  GERD . Pharmacist Clinical Goal(s) o Over the next 180 days, patient will work with PharmD and providers to optimize medication and minimize symptoms related to GERD. . Current regimen:  o Omeprazole 20mg  daily o Famotidine 40mg  daily . Interventions: o Discussed frequency of acid reflux symptoms o Counseled on long term effects of PPI. o Recommend taper of PPI therapy. . Patient self care activities - Over the next 180 days, patient will: o Continue to take medication until told otherwise by provider.   Initial goal documentation        [name] [name] was given information about Chronic Care Management services today including:  1. CCM service includes personalized support from designated clinical staff supervised by her physician, including individualized plan of care and coordination with other care providers 2. 24/7 contact phone numbers for assistance for urgent and routine care needs. 3. Standard insurance, coinsurance, copays and deductibles apply for chronic care management only during months in which we provide at least 20 minutes of these services. Most insurances cover these services at 100%, however patients may be responsible for any copay, coinsurance and/or  deductible if applicable. This service may help you avoid the need for more expensive face-to-face services. 4. Only one practitioner may furnish and bill the service in a calendar month. 5. The patient may stop CCM services at any time (effective at the end of the month) by phone call to the office staff.  Patient agreed to services and verbal  consent obtained.   The patient verbalized understanding of instructions provided today and agreed to receive a mailed copy of patient instruction and/or educational materials. Telephone follow up appointment with pharmacy team member scheduled for: 6 months  Beverly Milch, PharmD Clinical Pharmacist [facility] [phone]   Managing Your Hypertension Hypertension is commonly called Be[place]son blood pressure. This is when the force of your blood pressing against the walls of your arteries is too strong. Arteries are blood vessels that carry blood from your heart throughout your body. Hypertension forces the heart to work harder to pump blood, and may cause the arteries to become narrow or stiff. Having untreated or uncontrolled hypertension can cause heart attack, stroke, kidney disease, and other problems. What are blood pressure readings? A blood pressure reading consists of a higher number over a lower number. Ideally, your blood pressure should be below 120/80. The first ("top") number is called the systolic pressure. It is a measure of the pressure in your arteries as your heart beats. The second ("bottom") number is called the diastolic pressure. It is a measure of the pressure in your arteries as the heart relaxes. What does my blood pressure reading mean? Blood pressure is classified into four stages. Based on your blood pressure reading, your health care provider may use the following stages to determine what type of treatment you need, if any. Systolic pressure and diastolic pressure are measured in a unit called mm Hg. Normal  Systolic pressure: below 619.  Diastolic pressure: below 80. Elevated  Systolic pressure: 509-326.  Diastolic pressure: below 80. Hypertension stage 1  Systolic pressure: 712-458.  Diastolic pressure: 09-98. Hypertension stage 2  Systolic pressure: 338 or above.  Diastolic pressure: 90 or above. What health risks are associated  with hypertension? Managing your hypertension is an important responsibility. Uncontrolled hypertension can lead to:  A heart attack.  A stroke.  A weakened blood vessel (aneurysm).  Heart failure.  Kidney damage.  Eye damage.  Metabolic syndrome.  Memory and concentration problems. What changes can I make to manage my hypertension? Hypertension can be managed by making lifestyle changes and possibly by taking medicines. Your health care provider will help you make a plan to bring your blood pressure within a normal range. Eating and drinking   Eat a diet that is [place] in fiber and potassium, and low in salt (sodium), added sugar, and fat. An example eating plan is called the DASH (Dietary Approaches to Stop Hypertension) diet. To eat this way: ? Eat plenty of fresh fruits and vegetables. Try to fill half of your plate at each meal with fruits and vegetables. ? Eat whole grains, such as whole wheat pasta, brown rice, or whole grain bread. Fill about one quarter of your plate with whole grains. ? Eat low-fat diary products. ? Avoid fatty cuts of meat, processed or cured meats, and poultry with skin. Fill about one quarter of your plate with lean proteins such as fish, chicken without skin, beans, eggs, and tofu. ? Avoid premade and processed foods. These tend to be higher in sodium, added sugar, and fat.  Reduce your daily  sodium intake. Most people with hypertension should eat less than 1,500 mg of sodium a day.  Limit alcohol intake to no more than 1 drink a day for nonpregnant women and 2 drinks a day for men. One drink equals 12 oz of beer, 5 oz of wine, or 1 oz of hard liquor. Lifestyle  Work with your health care provider to maintain a healthy body weight, or to lose weight. Ask what an ideal weight is for you.  Get at least 30 minutes of exercise that causes your heart to beat faster (aerobic exercise) most days of the week. Activities may include walking, swimming, or  biking.  Include exercise to strengthen your muscles (resistance exercise), such as weight lifting, as part of your weekly exercise routine. Try to do these types of exercises for 30 minutes at least 3 days a week.  Do not use any products that contain nicotine or tobacco, such as cigarettes and e-cigarettes. If you need help quitting, ask your health care provider.  Control any long-term (chronic) conditions you have, such as [place] cholesterol or diabetes. Monitoring  Monitor your blood pressure at home as told by your health care provider. Your personal target blood pressure may vary depending on your medical conditions, your age, and other factors.  Have your blood pressure checked regularly, as often as told by your health care provider. Working with your health care provider  Review all the medicines you take with your health care provider because there may be side effects or interactions.  Talk with your health care provider about your diet, exercise habits, and other lifestyle factors that may be contributing to hypertension.  Visit your health care provider regularly. Your health care provider can help you create and adjust your plan for managing hypertension. Will I need medicine to control my blood pressure? Your health care provider may prescribe medicine if lifestyle changes are not enough to get your blood pressure under control, and if:  Your systolic blood pressure is 130 or higher.  Your diastolic blood pressure is 80 or higher. Take medicines only as told by your health care provider. Follow the directions carefully. Blood pressure medicines must be taken as prescribed. The medicine does not work as well when you skip doses. Skipping doses also puts you at risk for problems. Contact a health care provider if:  You think you are having a reaction to medicines you have taken.  You have repeated (recurrent) headaches.  You feel dizzy.  You have swelling in your  ankles.  You have trouble with your vision. Get help right away if:  You develop a severe headache or confusion.  You have unusual weakness or numbness, or you feel faint.  You have severe pain in your chest or abdomen.  You vomit repeatedly.  You have trouble breathing. Summary  Hypertension is when the force of blood pumping through your arteries is too strong. If this condition is not controlled, it may put you at risk for serious complications.  Your personal target blood pressure may vary depending on your medical conditions, your age, and other factors. For most people, a normal blood pressure is less than 120/80.  Hypertension is managed by lifestyle changes, medicines, or both. Lifestyle changes include weight loss, eating a healthy, low-sodium diet, exercising more, and limiting alcohol. This information is not intended to replace advice given to you by your health care provider. Make sure you discuss any questions you have with your health care provider. Document Revised: 01/24/2019  Document Reviewed: 08/30/2016 Elsevier Patient Education  [employer].

## 2020-05-21 ENCOUNTER — Other Ambulatory Visit: Payer: Self-pay | Admitting: *Deleted

## 2020-05-21 DIAGNOSIS — E133299 Other specified diabetes mellitus with mild nonproliferative diabetic retinopathy without macular edema, unspecified eye: Secondary | ICD-10-CM

## 2020-05-21 DIAGNOSIS — E669 Obesity, unspecified: Secondary | ICD-10-CM

## 2020-05-21 DIAGNOSIS — G894 Chronic pain syndrome: Secondary | ICD-10-CM

## 2020-05-21 DIAGNOSIS — K219 Gastro-esophageal reflux disease without esophagitis: Secondary | ICD-10-CM

## 2020-05-21 DIAGNOSIS — F411 Generalized anxiety disorder: Secondary | ICD-10-CM

## 2020-05-21 DIAGNOSIS — E66813 Obesity, class 3: Secondary | ICD-10-CM

## 2020-05-21 DIAGNOSIS — M51369 Other intervertebral disc degeneration, lumbar region without mention of lumbar back pain or lower extremity pain: Secondary | ICD-10-CM

## 2020-05-21 DIAGNOSIS — N951 Menopausal and female climacteric states: Secondary | ICD-10-CM

## 2020-05-21 DIAGNOSIS — M5136 Other intervertebral disc degeneration, lumbar region: Secondary | ICD-10-CM

## 2020-05-21 DIAGNOSIS — E7800 Pure hypercholesterolemia, unspecified: Secondary | ICD-10-CM

## 2020-05-21 DIAGNOSIS — I10 Essential (primary) hypertension: Secondary | ICD-10-CM

## 2020-05-21 DIAGNOSIS — E1169 Type 2 diabetes mellitus with other specified complication: Secondary | ICD-10-CM

## 2020-05-29 ENCOUNTER — Other Ambulatory Visit: Payer: Self-pay | Admitting: Family Medicine

## 2020-05-29 DIAGNOSIS — E669 Obesity, unspecified: Secondary | ICD-10-CM

## 2020-05-29 DIAGNOSIS — E1169 Type 2 diabetes mellitus with other specified complication: Secondary | ICD-10-CM

## 2020-06-04 ENCOUNTER — Other Ambulatory Visit: Payer: Self-pay | Admitting: *Deleted

## 2020-06-04 DIAGNOSIS — E1169 Type 2 diabetes mellitus with other specified complication: Secondary | ICD-10-CM

## 2020-06-04 DIAGNOSIS — E669 Obesity, unspecified: Secondary | ICD-10-CM

## 2020-06-04 MED ORDER — ACCU-CHEK SMARTVIEW VI STRP: ORAL_STRIP | 10 refills | Status: DC

## 2020-06-07 ENCOUNTER — Other Ambulatory Visit: Payer: Self-pay | Admitting: *Deleted

## 2020-06-07 DIAGNOSIS — E669 Obesity, unspecified: Secondary | ICD-10-CM

## 2020-06-07 MED ORDER — ACCU-CHEK SMARTVIEW VI STRP: ORAL_STRIP | 3 refills | Status: DC

## 2020-06-17 ENCOUNTER — Other Ambulatory Visit: Payer: Self-pay | Admitting: *Deleted

## 2020-06-17 MED ORDER — LOSARTAN POTASSIUM 50 MG PO TABS
50.0000 mg | ORAL_TABLET | Freq: Every day | ORAL | 3 refills | Status: DC
Start: 2020-06-17 — End: 2020-09-20

## 2020-06-17 MED ORDER — SPIRONOLACTONE 25 MG PO TABS
25.0000 mg | ORAL_TABLET | Freq: Every morning | ORAL | 3 refills | Status: DC
Start: 2020-06-17 — End: 2020-11-03

## 2020-06-17 MED ORDER — LINACLOTIDE 145 MCG PO CAPS: ORAL_CAPSULE | ORAL | 3 refills | Status: DC

## 2020-06-17 MED ORDER — OMEPRAZOLE 20 MG PO CPDR: 20.0000 mg | DELAYED_RELEASE_CAPSULE | Freq: Every day | ORAL | 3 refills | Status: DC

## 2020-06-17 MED ORDER — POLYETHYLENE GLYCOL 3350 17 GM/SCOOP PO POWD: ORAL | 3 refills | Status: DC

## 2020-06-17 MED ORDER — METFORMIN HCL 500 MG PO TABS
500.0000 mg | ORAL_TABLET | Freq: Two times a day (BID) | ORAL | 3 refills | Status: DC
Start: 2020-06-17 — End: 2020-11-03

## 2020-06-17 MED ORDER — ROSUVASTATIN CALCIUM 10 MG PO TABS
10.0000 mg | ORAL_TABLET | Freq: Every day | ORAL | 3 refills | Status: DC
Start: 2020-06-17 — End: 2020-09-20

## 2020-06-17 MED ORDER — AMLODIPINE BESYLATE 10 MG PO TABS
10.0000 mg | ORAL_TABLET | Freq: Every morning | ORAL | 3 refills | Status: DC
Start: 2020-06-17 — End: 2020-11-03

## 2020-06-17 MED ORDER — FUROSEMIDE 20 MG PO TABS
20.0000 mg | ORAL_TABLET | Freq: Every day | ORAL | 3 refills | Status: DC | PRN
Start: 2020-06-17 — End: 2020-09-20

## 2020-07-06 ENCOUNTER — Encounter: Payer: Self-pay | Admitting: Family Medicine

## 2020-07-06 ENCOUNTER — Ambulatory Visit (INDEPENDENT_AMBULATORY_CARE_PROVIDER_SITE_OTHER): Payer: Medicare Other | Admitting: Family Medicine

## 2020-07-06 ENCOUNTER — Other Ambulatory Visit: Payer: Self-pay

## 2020-07-06 VITALS — BP 130/78 | HR 68 | Temp 97.9°F | Resp 16 | Ht 62.0 in | Wt 240.0 lb

## 2020-07-06 DIAGNOSIS — M5136 Other intervertebral disc degeneration, lumbar region: Secondary | ICD-10-CM

## 2020-07-06 DIAGNOSIS — I10 Essential (primary) hypertension: Secondary | ICD-10-CM | POA: Diagnosis not present

## 2020-07-06 DIAGNOSIS — E7800 Pure hypercholesterolemia, unspecified: Secondary | ICD-10-CM | POA: Diagnosis not present

## 2020-07-06 DIAGNOSIS — R6889 Other general symptoms and signs: Secondary | ICD-10-CM | POA: Diagnosis not present

## 2020-07-06 DIAGNOSIS — E669 Obesity, unspecified: Secondary | ICD-10-CM

## 2020-07-06 DIAGNOSIS — E133299 Other specified diabetes mellitus with mild nonproliferative diabetic retinopathy without macular edema, unspecified eye: Secondary | ICD-10-CM | POA: Diagnosis not present

## 2020-07-06 DIAGNOSIS — D509 Iron deficiency anemia, unspecified: Secondary | ICD-10-CM | POA: Diagnosis not present

## 2020-07-06 DIAGNOSIS — Z23 Encounter for immunization: Secondary | ICD-10-CM | POA: Diagnosis not present

## 2020-07-06 DIAGNOSIS — E1169 Type 2 diabetes mellitus with other specified complication: Secondary | ICD-10-CM

## 2020-07-06 MED ORDER — HYDROCODONE-ACETAMINOPHEN 5-325 MG PO TABS
1.0000 | ORAL_TABLET | Freq: Four times a day (QID) | ORAL | 0 refills | Status: DC | PRN
Start: 2020-07-06 — End: 2020-11-03

## 2020-07-06 NOTE — Assessment & Plan Note (Signed)
Continue crestor, check LFT, lipids Goal LDL < 100

## 2020-07-06 NOTE — Assessment & Plan Note (Signed)
Controlled no changes 

## 2020-07-06 NOTE — Assessment & Plan Note (Addendum)
Well controlled on metformin  PNA and flu shot given today

## 2020-07-06 NOTE — Progress Notes (Signed)
   Subjective:    Patient ID: [name] [name], female    DOB: [birth year], 64 y.o.   MRN: [id number]  Patient presents for Follow-up (is not fasting) and L Knee Pain  Pt here to f/u chronic medical   She has chronic knee pain with OA given shot from ortho last visit in MAY   request refill on pain meds, has DDD lumbar spine   DM- last A1C 6.4%  Taking metformin , she has been working on reduce carbs   Iron def anemia- pt started iron tablet daily , recommended she have colonoscopy  she delcines at this time   HTN- taking BP meds as prescribed,   Due for flu shot / PNA   She has appt with Dr. Shiela Mayer, she is still  on MTX    Review Of Systems:  GEN- denies fatigue, fever, weight loss,weakness, recent illness HEENT- denies eye drainage, change in vision, nasal discharge, CVS- denies chest pain, palpitations RESP- denies SOB, cough, wheeze ABD- denies N/V, change in stools, abd pain GU- denies dysuria, hematuria, dribbling, incontinence MSK-+joint pain, muscle aches, injury Neuro- denies headache, dizziness, syncope, seizure activity       Objective:    BP 130/78   Pulse 68   Temp 97.9 F (36.6 C) (Temporal)   Resp 16   Ht 5\' 2"  (1.575 m)   Wt 240 lb (108.9 kg)   LMP 06/03/2012   SpO2 99%   BMI 43.90 kg/m  GEN- NAD, alert and oriented x3 HEENT- PERRL, EOMI, non injected sclera, pink conjunctiva, MMM, oropharynx clear Neck- Supple, no thyromegaly CVS- RRR, no murmur RESP-CTAB ABD-NABS,soft,NT,ND EXT- No edema Pulses- Radial, DP- 2+        Assessment & Plan:      Problem List Items Addressed This Visit      Unprioritized   Class 3 obesity   DDD (degenerative disc disease), lumbar    Pain meds refilled Encourage any physical activity and weight loss F/u ortho for knee pain      Relevant Medications   HYDROcodone-acetaminophen ([place]) 5-325 MG tablet   Diabetes mellitus type 2 in obese (HCC)    Well controlled on metformin  PNA and flu shot given  today       Relevant Orders   Hemoglobin A1c   Lipid panel   HTN, goal below 130/80 - Primary    Controlled no changes       Relevant Orders   CBC with Differential/Platelet   Comprehensive metabolic panel   Hyperlipidemia    Continue crestor, check LFT, lipids Goal LDL < 100       Retinopathy due to secondary diabetes mellitus, without macular edema, with mild nonproliferative retinopathy (HCC)    Other Visit Diagnoses    Iron deficiency anemia, unspecified iron deficiency anemia type       Relevant Orders   Iron, TIBC and Ferritin Panel   Vitamin B12      Note: This dictation was prepared with Dragon dictation along with smaller phrase technology. Any transcriptional errors that result from this process are unintentional.

## 2020-07-06 NOTE — Patient Instructions (Signed)
F/u 4 months 

## 2020-07-06 NOTE — Assessment & Plan Note (Addendum)
Pain meds refilled Encourage any physical activity and weight loss F/u ortho for knee pain

## 2020-07-07 LAB — COMPREHENSIVE METABOLIC PANEL
AG Ratio: 1.3 (calc) (ref 1.0–2.5)
ALT: 9 U/L (ref 6–29)
AST: 15 U/L (ref 10–35)
Albumin: 4.1 g/dL (ref 3.6–5.1)
Alkaline phosphatase (APISO): 118 U/L (ref 37–153)
BUN/Creatinine Ratio: 13 (calc) (ref 6–22)
BUN: 14 mg/dL (ref 7–25)
CO2: 30 mmol/L (ref 20–32)
Calcium: 9.7 mg/dL (ref 8.6–10.4)
Chloride: 102 mmol/L (ref 98–110)
Creat: 1.08 mg/dL — ABNORMAL HIGH (ref 0.50–0.99)
Globulin: 3.2 g/dL (calc) (ref 1.9–3.7)
Glucose, Bld: 121 mg/dL — ABNORMAL HIGH (ref 65–99)
Potassium: 4.3 mmol/L (ref 3.5–5.3)
Sodium: 140 mmol/L (ref 135–146)
Total Bilirubin: 0.4 mg/dL (ref 0.2–1.2)
Total Protein: 7.3 g/dL (ref 6.1–8.1)

## 2020-07-07 LAB — VITAMIN B12: Vitamin B-12: >2000 pg/mL — ABNORMAL HIGH (ref 200–1100)

## 2020-07-07 LAB — CBC WITH DIFFERENTIAL/PLATELET
Absolute Monocytes: 511 cells/uL (ref 200–950)
Basophils Absolute: 52 cells/uL (ref 0–200)
Basophils Relative: 0.7 %
Eosinophils Absolute: 488 cells/uL (ref 15–500)
Eosinophils Relative: 6.6 %
HCT: 35.2 % (ref 35.0–45.0)
Hemoglobin: 11.4 g/dL — ABNORMAL LOW (ref 11.7–15.5)
Lymphs Abs: 1332 cells/uL (ref 850–3900)
MCH: 28.4 pg (ref 27.0–33.0)
MCHC: 32.4 g/dL (ref 32.0–36.0)
MCV: 87.6 fL (ref 80.0–100.0)
MPV: 9.5 fL (ref 7.5–12.5)
Monocytes Relative: 6.9 %
Neutro Abs: 5017 cells/uL (ref 1500–7800)
Neutrophils Relative %: 67.8 %
Platelets: 284 10*3/uL (ref 140–400)
RBC: 4.02 10*6/uL (ref 3.80–5.10)
RDW: 13.8 % (ref 11.0–15.0)
Total Lymphocyte: 18 %
WBC: 7.4 10*3/uL (ref 3.8–10.8)

## 2020-07-07 LAB — LIPID PANEL
Cholesterol: 128 mg/dL (ref <200)
HDL: 47 mg/dL — ABNORMAL LOW (ref >=50)
LDL Cholesterol (Calc): 65 mg/dL (calc)
Non-HDL Cholesterol (Calc): 81 mg/dL (calc) (ref <130)
Total CHOL/HDL Ratio: 2.7 (calc) (ref <5.0)
Triglycerides: 84 mg/dL (ref <150)

## 2020-07-07 LAB — HEMOGLOBIN A1C
Hgb A1c MFr Bld: 6.5 % of total Hgb — ABNORMAL HIGH (ref <5.7)
Mean Plasma Glucose: 140 (calc)
eAG (mmol/L): 7.7 (calc)

## 2020-07-07 LAB — IRON,TIBC AND FERRITIN PANEL
%SAT: 10 % (calc) — ABNORMAL LOW (ref 16–45)
Ferritin: 72 ng/mL (ref 16–288)
Iron: 31 ug/dL — ABNORMAL LOW (ref 45–160)
TIBC: 305 mcg/dL (calc) (ref 250–450)

## 2020-07-07 NOTE — Addendum Note (Signed)
Addended by: Sheral Flow on: 07/07/2020 02:05 PM   Modules accepted: Orders

## 2020-07-09 ENCOUNTER — Ambulatory Visit: Payer: Medicare HMO | Admitting: Family Medicine

## 2020-07-12 DIAGNOSIS — L439 Lichen planus, unspecified: Secondary | ICD-10-CM | POA: Diagnosis not present

## 2020-07-12 DIAGNOSIS — L438 Other lichen planus: Secondary | ICD-10-CM | POA: Diagnosis not present

## 2020-07-12 DIAGNOSIS — Z5181 Encounter for therapeutic drug level monitoring: Secondary | ICD-10-CM | POA: Diagnosis not present

## 2020-07-12 DIAGNOSIS — Z79899 Other long term (current) drug therapy: Secondary | ICD-10-CM | POA: Diagnosis not present

## 2020-07-26 ENCOUNTER — Other Ambulatory Visit: Payer: Self-pay | Admitting: Family Medicine

## 2020-07-27 ENCOUNTER — Other Ambulatory Visit: Payer: Self-pay | Admitting: Family Medicine

## 2020-07-28 ENCOUNTER — Other Ambulatory Visit (HOSPITAL_COMMUNITY): Payer: Self-pay | Admitting: Family Medicine

## 2020-07-28 DIAGNOSIS — Z1231 Encounter for screening mammogram for malignant neoplasm of breast: Secondary | ICD-10-CM

## 2020-08-09 ENCOUNTER — Other Ambulatory Visit: Payer: Self-pay

## 2020-08-09 ENCOUNTER — Ambulatory Visit (HOSPITAL_COMMUNITY)
Admission: RE | Admit: 2020-08-09 | Discharge: 2020-08-09 | Disposition: A | Discharge status: Home / Self Care | Payer: Medicare HMO | Source: Ambulatory Visit | Attending: Family Medicine | Admitting: Family Medicine

## 2020-08-09 DIAGNOSIS — Z1231 Encounter for screening mammogram for malignant neoplasm of breast: Secondary | ICD-10-CM | POA: Diagnosis not present

## 2020-08-10 DIAGNOSIS — K219 Gastro-esophageal reflux disease without esophagitis: Secondary | ICD-10-CM | POA: Diagnosis not present

## 2020-08-10 DIAGNOSIS — R490 Dysphonia: Secondary | ICD-10-CM | POA: Diagnosis not present

## 2020-09-18 ENCOUNTER — Other Ambulatory Visit: Payer: Self-pay | Admitting: Family Medicine

## 2020-09-22 ENCOUNTER — Ambulatory Visit: Payer: Self-pay | Admitting: Pharmacist

## 2020-09-22 NOTE — Chronic Care Management (AMB) (Signed)
Chronic Care Management   Follow Up Note   09/22/2020 Name: [name] [name] MRN: [id number] DOB: 09-28-1956  Referred by: Alycia Rossetti, MD Reason for referral : No chief complaint on file.   [name] [name] is a 64 y.o. year old female who is a primary care patient of [place], Modena Nunnery, MD. The CCM team was consulted for assistance with chronic disease management and care coordination needs.    Review of patient status, including review of consultants reports, relevant laboratory and other test results, and collaboration with appropriate care team members and the patient's provider was performed as part of comprehensive patient evaluation and provision of chronic care management services.    SDOH (Social Determinants of Health) assessments performed: No See Care Plan activities for detailed interventions related to [facility])     Outpatient Encounter Medications as of 09/22/2020  Medication Sig  . Accu-Chek FastClix Lancets MISC TEST BLOOD SUGAR TWICE DAILY  . acitretin (SORIATANE) 25 MG capsule Take 25 mg by mouth daily.   Marland Kitchen albuterol (VENTOLIN HFA) 108 (90 Base) MCG/ACT inhaler INHALE 2 PUFFS INTO THE LUNGS EVERY 4 HOURS AS NEEDED FOR WHEEZING OR SHORTNESS OF BREATH  . Alcohol Swabs (B-D SINGLE USE SWABS REGULAR) PADS USE TWICE DAILY  . ALPRAZolam (XANAX) 1 MG tablet TAKE 1 TABLET(1 MG) BY MOUTH TWICE DAILY AS NEEDED  . amLODipine (NORVASC) 10 MG tablet Take 1 tablet (10 mg total) by mouth every morning.  Marland Kitchen aspirin 325 MG tablet Take 325 mg by mouth every morning.   . Blood Glucose Calibration (ACCU-CHEK SMARTVIEW CONTROL) LIQD USE AS DIRECTED  . Blood Glucose Monitoring Suppl w/Device KIT TEST FASTING BLOOD SUGAR TWICE PER DAY DX; E11.9  . Cholecalciferol (VITAMIN D-3) 1000 UNITS CAPS Take by mouth.  . clobetasol ointment (TEMOVATE) 0.05 % APPLY IN SORE AREAS OF MOUTH BID. NOT ON FACE/GROIN  . co-enzyme Q-10 30 MG capsule Take 1 capsule (30 mg total) by mouth 3 (three) times daily.   . famotidine (PEPCID) 40 MG tablet Take 40 mg by mouth daily.  . fluocinonide ointment (LIDEX) 0.05 % Apply to affected areas of body twice daily. Never on face/groin  . fluticasone (FLONASE) 50 MCG/ACT nasal spray USE 2 SPRAYS IN BOTH NOSTRILS DAILY.  . furosemide (LASIX) 20 MG tablet TAKE 1 TABLET (20 MG TOTAL) BY MOUTH DAILY AS NEEDED.  Marland Kitchen glucose blood (ACCU-CHEK SMARTVIEW) test strip Use as directed to monitor FSBS 2x daily. Dx: E11.69  . HYDROcodone-acetaminophen (NORCO) 5-325 MG tablet Take 1 tablet by mouth every 6 (six) hours as needed for moderate pain.  Elmore Guise Devices (ADJUSTABLE LANCING DEVICE) MISC TEST FASTING BLOOD SUGAR TWICE PER DAY DX; E11.9  . lidocaine (XYLOCAINE) 2 % solution Use as directed 5-10 mLs in the mouth or throat 2 (two) times daily.  Marland Kitchen LINZESS 145 MCG CAPS capsule TAKE 1 CAPSULE (145 MCG TOTAL) BY MOUTH DAILY BEFORE BREAKFAST.  Marland Kitchen losartan (COZAAR) 50 MG tablet TAKE 1 TABLET EVERY DAY  . meclizine (ANTIVERT) 12.5 MG tablet Take 1 tablet (12.5 mg total) by mouth 3 (three) times daily as needed for dizziness.  . metFORMIN (GLUCOPHAGE) 500 MG tablet Take 1 tablet (500 mg total) by mouth 2 (two) times daily with a meal.  . methocarbamol (ROBAXIN) 500 MG tablet Take 1 tablet (500 mg total) by mouth every 6 (six) hours as needed for muscle spasms.  . methotrexate (RHEUMATREX) 2.5 MG tablet Take 3 tablets (7.5 mg total) by mouth once a week. Caution:Chemotherapy.  Protect from light.  . Multiple Vitamins-Minerals (CENTRUM ADULTS PO) Take by mouth.  . naproxen (NAPROSYN) 500 MG tablet TAKE 1 TABLET TWICE DAILY WITH MEALS  . omeprazole (PRILOSEC) 20 MG capsule Take 1 capsule (20 mg total) by mouth daily.  . polyethylene glycol powder (GLYCOLAX/MIRALAX) 17 GM/SCOOP powder MIX 1 CAPFUL (17GM) IN 8 OUNCES OF WATER AND DRINK EVERY DAY  . rosuvastatin (CRESTOR) 10 MG tablet TAKE 1 TABLET (10 MG TOTAL) BY MOUTH AT BEDTIME.  Marland Kitchen spironolactone (ALDACTONE) 25 MG tablet Take 1 tablet  (25 mg total) by mouth every morning.  . tacrolimus (PROGRAF) 1 MG capsule Take by mouth.  . triamcinolone cream (KENALOG) 0.1 % APPLY TOPICALLY TO VULVA 2 TIMES DAILY  . vitamin B-12 (CYANOCOBALAMIN) 1000 MCG tablet Take 1 tablet (1,000 mcg total) by mouth daily.   No facility-administered encounter medications on file as of 09/22/2020.   Reviewed chart ahead of call about Diabetes control.  Recent OV's 07/06/2020 [facility]) - reports chronic knee pain with OA which she received a shot at her last ortho visit.  She is taking metformin and has been working to reduce her carbs.  No changes at this visit, all lab work is normal and her A1c was 6.5.  She is continued on current metformin dose.  Recent Relevant Labs: Lab Results  Component Value Date/Time   HGBA1C 6.5 (H) 07/06/2020 12:04 PM   HGBA1C 6.4 (H) 03/08/2020 03:52 PM   MICROALBUR 0.8 03/08/2020 03:52 PM   MICROALBUR 1.0 06/20/2018 02:45 PM    Kidney Function Lab Results  Component Value Date/Time   CREATININE 1.08 (H) 07/06/2020 12:04 PM   CREATININE 0.85 03/08/2020 03:52 PM   GFRNONAA 61 06/20/2018 02:45 PM   GFRAA 71 06/20/2018 02:45 PM    . Current antihyperglycemic regimen:  o Metformin 557m bid with a meal . What recent interventions/DTPs have been made to improve glycemic control:  o None, patient continues to watch carbs and sugars . Have there been any recent hospitalizations or ED visits since last visit with CPP? No . Patient denies hypoglycemic symptoms, including Sweaty, Shaky, Hungry and Nervous/irritable . Patient denies hyperglycemic symptoms, including blurry vision, excessive thirst and fatigue . How often are you checking your blood sugar? once daily . What are your blood sugars ranging?  o Fasting: <130 per patient logs . During the week, how often does your blood glucose drop below 70? Never, she denies any low blood sugars or symptoms . Are you checking your feet daily/regularly? Patient is aware to  check her feet   Adherence Review: Is the patient currently on a STATIN medication? Yes Is the patient currently on ACE/ARB medication? Yes Does the patient have >5 day gap between last estimated fill dates? No  She reports feeling well overall, no changes to medications.  Informed me that she is scheduled to get her booster shot next week before the holiday festivities start.  Continues to take care of her mom and remains very active.  No concerns at this time.  Reminded her of my direct line should she need to reach out in the future about anything.  Discussed medications and she does not need any refills at this time.  CBeverly Milch PharmD Clinical Pharmacist [facility]([phone]

## 2020-11-02 ENCOUNTER — Telehealth: Payer: Self-pay | Admitting: Family Medicine

## 2020-11-02 NOTE — Telephone Encounter (Signed)
Patient needs her HYDROcodone-acetaminophen (NORCO) 5-325 MG tablet and her ALPRAZolam Duanne Moron) 1 MG tablet   Called into [facility] on [address] In [place]  She is requesting a refill on her other medications called into her mail order pharmacy [facility] CB# [phone]

## 2020-11-03 ENCOUNTER — Other Ambulatory Visit: Payer: Self-pay

## 2020-11-03 ENCOUNTER — Other Ambulatory Visit: Payer: Self-pay | Admitting: Family Medicine

## 2020-11-03 MED ORDER — ROSUVASTATIN CALCIUM 10 MG PO TABS: 10.0000 mg | ORAL_TABLET | Freq: Every day | ORAL | 3 refills | Status: DC

## 2020-11-03 MED ORDER — BD SWAB SINGLE USE REGULAR PADS: 200.0000 | MEDICATED_PAD | Freq: Two times a day (BID) | 3 refills | Status: DC

## 2020-11-03 MED ORDER — LOSARTAN POTASSIUM 50 MG PO TABS: 50.0000 mg | ORAL_TABLET | Freq: Every day | ORAL | 3 refills | Status: DC

## 2020-11-03 MED ORDER — ACCU-CHEK FASTCLIX LANCETS MISC: 3 refills | Status: DC

## 2020-11-03 MED ORDER — FLUTICASONE PROPIONATE 50 MCG/ACT NA SUSP: NASAL | 3 refills | Status: DC

## 2020-11-03 MED ORDER — FUROSEMIDE 20 MG PO TABS: 20.0000 mg | ORAL_TABLET | Freq: Every day | ORAL | 3 refills | Status: DC | PRN

## 2020-11-03 MED ORDER — ALPRAZOLAM 1 MG PO TABS: ORAL_TABLET | ORAL | 1 refills | Status: DC

## 2020-11-03 MED ORDER — HYDROCODONE-ACETAMINOPHEN 5-325 MG PO TABS: 1.0000 | ORAL_TABLET | Freq: Four times a day (QID) | ORAL | 0 refills | Status: DC | PRN

## 2020-11-03 MED ORDER — METFORMIN HCL 500 MG PO TABS: 500.0000 mg | ORAL_TABLET | Freq: Two times a day (BID) | ORAL | 3 refills | Status: DC

## 2020-11-03 NOTE — Telephone Encounter (Signed)
Refills sent to patient pharmacy

## 2020-11-05 ENCOUNTER — Ambulatory Visit: Payer: Medicare Other | Admitting: Family Medicine

## 2020-11-10 ENCOUNTER — Other Ambulatory Visit: Payer: Self-pay

## 2020-11-10 ENCOUNTER — Ambulatory Visit (INDEPENDENT_AMBULATORY_CARE_PROVIDER_SITE_OTHER): Payer: Medicare HMO | Admitting: Family Medicine

## 2020-11-10 ENCOUNTER — Encounter: Payer: Self-pay | Admitting: Family Medicine

## 2020-11-10 VITALS — BP 128/78 | HR 72 | Temp 98.4°F | Resp 16 | Ht 62.0 in | Wt 233.0 lb

## 2020-11-10 DIAGNOSIS — E133299 Other specified diabetes mellitus with mild nonproliferative diabetic retinopathy without macular edema, unspecified eye: Secondary | ICD-10-CM | POA: Diagnosis not present

## 2020-11-10 DIAGNOSIS — I10 Essential (primary) hypertension: Secondary | ICD-10-CM | POA: Diagnosis not present

## 2020-11-10 DIAGNOSIS — E669 Obesity, unspecified: Secondary | ICD-10-CM

## 2020-11-10 DIAGNOSIS — E1169 Type 2 diabetes mellitus with other specified complication: Secondary | ICD-10-CM

## 2020-11-10 DIAGNOSIS — E7800 Pure hypercholesterolemia, unspecified: Secondary | ICD-10-CM

## 2020-11-10 NOTE — Assessment & Plan Note (Signed)
Recent lipids at goal in September.

## 2020-11-10 NOTE — Patient Instructions (Signed)
F/U 4 months [name] [name]  °

## 2020-11-10 NOTE — Progress Notes (Signed)
   Subjective:    Patient ID: [name] [name], female    DOB: [birth year], 65 y.o.   MRN: [id number]  Patient presents for Follow-up (Is not fasting/)   She has chronic knee pain with OA , has DDD lumbar spine , her joints ache on and off. She has been trying to walk She is taking norco for pain   DM- last A1C 6.5%  In Sept Taking metformin  500mg  BID , she has been working on reduce carbs ,weight down 7lbs She has been eating soft foods, mostly veggies, getting some dental work   Iron def anemia- pt taking  iron tablet daily   HTN- taking BP meds as prescribed,   Due for flu shot / PNA   She has appt with Dr. Shiela Mayer, she is still  on MTX/prograf  for the lichen  Lipids at goal in Sept   COVID-19 Vaccine UTD   Dypshonia/GERD- she states ENT/Dr. Benjamine Mola to reduce pepcid to 20mg  pharmacy gave her 40mg    Eye doctor tomorrow at [facility]  Review Of Systems:  GEN- denies fatigue, fever, weight loss,weakness, recent illness HEENT- denies eye drainage, change in vision, nasal discharge, CVS- denies chest pain, palpitations RESP- denies SOB, cough, wheeze ABD- denies N/V, change in stools, abd pain GU- denies dysuria, hematuria, dribbling, incontinence MSK-+ joint pain, muscle aches, injury Neuro- denies headache, dizziness, syncope, seizure activity       Objective:    BP 128/78   Pulse 72   Temp 98.4 F (36.9 C) (Temporal)   Resp 16   Ht 5\' 2"  (1.575 m)   Wt 233 lb (105.7 kg)   LMP 06/03/2012   SpO2 96%   BMI 42.62 kg/m  GEN- NAD, alert and oriented x3 HEENT- PERRL, EOMI, non injected sclera, pink conjunctiva, Neck- Supple, no thyromegaly CVS- RRR, no murmur RESP-CTAB ABD-NABS,soft,NT,ND EXT- No edema Pulses- Radial, DP- 2+        Assessment & Plan:      Problem List Items Addressed This Visit      Unprioritized   Class 3 obesity    Reducing carbs and sugars in diet.      Diabetes mellitus type 2 in obese (HCC)    Recheck A1c.  She will  continue on Metformin Goal is A1c less than 7%.      Relevant Orders   HM DIABETES FOOT EXAM (Completed)   CBC with Differential/Platelet   Comprehensive metabolic panel   Hemoglobin A1c   HTN, goal below 130/80 - Primary    Blood pressure is controlled.  No change in medication.  Been working on her diet her weight is down 7 pounds.      Relevant Orders   CBC with Differential/Platelet   Comprehensive metabolic panel   Hemoglobin A1c   Hyperlipidemia    Recent lipids at goal in September.      Retinopathy due to secondary diabetes mellitus, without macular edema, with mild nonproliferative retinopathy ([place])      Note: This dictation was prepared with Dragon dictation along with smaller phrase technology. Any transcriptional errors that result from this process are unintentional.

## 2020-11-10 NOTE — Assessment & Plan Note (Signed)
Reducing carbs and sugars in diet.

## 2020-11-10 NOTE — Assessment & Plan Note (Addendum)
Recheck A1c.  She will continue on Metformin Goal is A1c less than 7%.

## 2020-11-10 NOTE — Assessment & Plan Note (Signed)
Blood pressure is controlled.  No change in medication.  Been working on her diet her weight is down 7 pounds.

## 2020-11-11 ENCOUNTER — Encounter: Payer: Self-pay | Admitting: *Deleted

## 2020-11-11 DIAGNOSIS — E119 Type 2 diabetes mellitus without complications: Secondary | ICD-10-CM | POA: Diagnosis not present

## 2020-11-11 LAB — COMPREHENSIVE METABOLIC PANEL
AG Ratio: 1.3 (calc) (ref 1.0–2.5)
ALT: 9 U/L (ref 6–29)
AST: 15 U/L (ref 10–35)
Albumin: 4 g/dL (ref 3.6–5.1)
Alkaline phosphatase (APISO): 117 U/L (ref 37–153)
BUN: 10 mg/dL (ref 7–25)
CO2: 34 mmol/L — ABNORMAL HIGH (ref 20–32)
Calcium: 9.6 mg/dL (ref 8.6–10.4)
Chloride: 102 mmol/L (ref 98–110)
Creat: 0.85 mg/dL (ref 0.50–0.99)
Globulin: 3.2 g/dL (calc) (ref 1.9–3.7)
Glucose, Bld: 96 mg/dL (ref 65–99)
Potassium: 4.1 mmol/L (ref 3.5–5.3)
Sodium: 141 mmol/L (ref 135–146)
Total Bilirubin: 0.6 mg/dL (ref 0.2–1.2)
Total Protein: 7.2 g/dL (ref 6.1–8.1)

## 2020-11-11 LAB — CBC WITH DIFFERENTIAL/PLATELET
Absolute Monocytes: 442 cells/uL (ref 200–950)
Basophils Absolute: 52 cells/uL (ref 0–200)
Basophils Relative: 0.8 %
Eosinophils Absolute: 351 cells/uL (ref 15–500)
Eosinophils Relative: 5.4 %
HCT: 34.7 % — ABNORMAL LOW (ref 35.0–45.0)
Hemoglobin: 11.4 g/dL — ABNORMAL LOW (ref 11.7–15.5)
Lymphs Abs: 1690 cells/uL (ref 850–3900)
MCH: 28.1 pg (ref 27.0–33.0)
MCHC: 32.9 g/dL (ref 32.0–36.0)
MCV: 85.5 fL (ref 80.0–100.0)
MPV: 9.5 fL (ref 7.5–12.5)
Monocytes Relative: 6.8 %
Neutro Abs: 3965 cells/uL (ref 1500–7800)
Neutrophils Relative %: 61 %
Platelets: 326 10*3/uL (ref 140–400)
RBC: 4.06 10*6/uL (ref 3.80–5.10)
RDW: 14.3 % (ref 11.0–15.0)
Total Lymphocyte: 26 %
WBC: 6.5 10*3/uL (ref 3.8–10.8)

## 2020-11-11 LAB — HEMOGLOBIN A1C
Hgb A1c MFr Bld: 6.6 % of total Hgb — ABNORMAL HIGH (ref <5.7)
Mean Plasma Glucose: 143 mg/dL
eAG (mmol/L): 7.9 mmol/L

## 2020-11-11 LAB — HM DIABETES EYE EXAM

## 2020-11-15 DIAGNOSIS — Z79899 Other long term (current) drug therapy: Secondary | ICD-10-CM | POA: Diagnosis not present

## 2020-11-15 DIAGNOSIS — L438 Other lichen planus: Secondary | ICD-10-CM | POA: Diagnosis not present

## 2020-11-15 DIAGNOSIS — L439 Lichen planus, unspecified: Secondary | ICD-10-CM | POA: Diagnosis not present

## 2020-11-15 DIAGNOSIS — Z5181 Encounter for therapeutic drug level monitoring: Secondary | ICD-10-CM | POA: Diagnosis not present

## 2020-11-22 NOTE — Progress Notes (Signed)
Chronic Care Management Pharmacy Note  11/23/2020 Name:  [name] [name] MRN:  [id number] DOB:  1955-12-25  Subjective: [name] [name] is an 65 y.o. year old female who is a primary patient of [place], Modena Nunnery, MD.  The CCM team was consulted for assistance with disease management and care coordination needs.    Engaged with patient by telephone for follow up visit in response to provider referral for pharmacy case management and/or care coordination services.   Consent to Services:  The patient was given the following information about Chronic Care Management services today, agreed to services, and gave verbal consent: 1. CCM service includes personalized support from designated clinical staff supervised by the primary care provider, including individualized plan of care and coordination with other care providers 2. 24/7 contact phone numbers for assistance for urgent and routine care needs. 3. Service will only be billed when office clinical staff spend 20 minutes or more in a month to coordinate care. 4. Only one practitioner may furnish and bill the service in a calendar month. 5.The patient may stop CCM services at any time (effective at the end of the month) by phone call to the office staff. 6. The patient will be responsible for cost sharing (co-pay) of up to 20% of the service fee (after annual deductible is met). Patient agreed to services and consent obtained.  Patient Care Team: [facility], Modena Nunnery, MD as PCP - General (Family Medicine) Josue Hector, MD (Cardiology) Edythe Clarity, [facility] as Pharmacist (Pharmacist)  Recent office visits: 11/10/20 [facility]) - down 7 pounds by working on her diet, no medication changes noted  09/21/521 [facility]) - no med changes, encouraged weight loss and increased physical activity.  Followed by ortho for her knee pain.  03/08/2020 [facility]) - Routine medical exan  Patient was to schedule bone density exam  Patient had mild anemia, iron was  increased to daily $Remove'325mg'cITecob$   11/07/2019 [facility]) -   No changes to medications, all chronic conditions controlled   Recent consult visits: 04/06/2020 Sharol Roussel, Derm) -   Tacrolimus swish for mouth lesion  Clobetasol/Lidex given to use prn   Objective:  Lab Results  Component Value Date   CREATININE 0.85 11/10/2020   BUN 10 11/10/2020   GFRNONAA 61 06/20/2018   GFRAA 71 06/20/2018   NA 141 11/10/2020   K 4.1 11/10/2020   CALCIUM 9.6 11/10/2020   CO2 34 (H) 11/10/2020    Lab Results  Component Value Date/Time   HGBA1C 6.6 (H) 11/10/2020 02:33 PM   HGBA1C 6.5 (H) 07/06/2020 12:04 PM   MICROALBUR 0.8 03/08/2020 03:52 PM   MICROALBUR 1.0 06/20/2018 02:45 PM    Last diabetic Eye exam:  Lab Results  Component Value Date/Time   HMDIABEYEEXA Retinopathy (A) 10/24/2018 12:00 AM    Last diabetic Foot exam:  Lab Results  Component Value Date/Time   HMDIABFOOTEX yes  11/03/2010 12:00 AM     Lab Results  Component Value Date   CHOL 128 07/06/2020   HDL 47 (L) 07/06/2020   LDLCALC 65 07/06/2020   TRIG 84 07/06/2020   CHOLHDL 2.7 07/06/2020    Hepatic Function Latest Ref Rng & Units 11/10/2020 07/06/2020 03/08/2020  Total Protein 6.1 - 8.1 g/dL 7.2 7.3 7.3  Albumin 3.6 - 5.1 g/dL - - -  AST 10 - 35 U/L $Remo'15 15 17  'rTLPe$ ALT 6 - 29 U/L $Remo'9 9 10  'YcCsL$ Alk Phosphatase 33 - 130 U/L - - -  Total Bilirubin 0.2 -  1.2 mg/dL 0.6 0.4 0.6  Bilirubin, Direct 0.0 - 0.3 mg/dL - - -    Lab Results  Component Value Date/Time   TSH 1.34 01/09/2017 12:24 PM   TSH 1.890 08/15/2012 09:13 AM    CBC Latest Ref Rng & Units 11/10/2020 07/06/2020 03/08/2020  WBC 3.8 - 10.8 Thousand/uL 6.5 7.4 7.6  Hemoglobin 11.7 - 15.5 g/dL 11.4(L) 11.4(L) 11.0(L)  Hematocrit 35.0 - 45.0 % 34.7(L) 35.2 34.8(L)  Platelets 140 - 400 Thousand/uL 326 284 308    Lab Results  Component Value Date/Time   VD25OH 55 07/21/2014 10:33 AM   VD25OH 31 08/15/2012 09:13 AM    Clinical ASCVD: No  The ASCVD Risk score Mikey Bussing DC  Jr., et al., 2013) failed to calculate for the following reasons:   The valid total cholesterol range is 130 to 320 mg/dL    Depression screen [facility] 2/9 11/10/2020 03/08/2020 07/08/2019  Decreased Interest 0 0 0  Down, Depressed, Hopeless 0 0 0  PHQ - 2 Score 0 0 0  Altered sleeping - 1 -  Tired, decreased energy - 1 -  Change in appetite - 0 -  Feeling bad or failure about yourself  - 0 -  Trouble concentrating - 0 -  Moving slowly or fidgety/restless - 0 -  Suicidal thoughts - 0 -  PHQ-9 Score - 2 -  Difficult doing work/chores - Somewhat difficult -  Some recent data might be hidden       Social History   Tobacco Use  Smoking Status Never Smoker  Smokeless Tobacco Never Used   BP Readings from Last 3 Encounters:  11/10/20 128/78  07/06/20 130/78  03/08/20 124/60   Pulse Readings from Last 3 Encounters:  11/10/20 72  07/06/20 68  03/08/20 78   Wt Readings from Last 3 Encounters:  11/10/20 233 lb (105.7 kg)  07/06/20 240 lb (108.9 kg)  03/08/20 237 lb (107.5 kg)    Assessment/Interventions: Review of patient past medical history, allergies, medications, health status, including review of consultants reports, laboratory and other test data, was performed as part of comprehensive evaluation and provision of chronic care management services.   SDOH:  (Social Determinants of Health) assessments and interventions performed:   CCM Care Plan  Allergies  Allergen Reactions  . Contrast Media [Iodinated Diagnostic Agents] Hives  . Lisinopril     REACTION: cough  . Buspar [Buspirone Hcl] Palpitations    Medications Reviewed Today    Reviewed by Edythe Clarity, [facility] (Pharmacist) on 11/23/20 at 1227  Med List Status: <None>  Medication Order Taking? Sig Documenting Provider Last Dose Status Informant  Accu-Chek FastClix Lancets MISC [id number] Yes TEST BLOOD SUGAR TWICE DAILY [place], Modena Nunnery, MD Taking Active   acitretin [facility]) 25 MG capsule [id number] Yes Take 25  mg by mouth daily.  [provider] Taking Active   albuterol (VENTOLIN HFA) 108 (90 Base) MCG/ACT inhaler [id number] Yes INHALE 2 PUFFS INTO THE LUNGS EVERY 4 HOURS AS NEEDED FOR WHEEZING OR SHORTNESS OF BREATH [place], Modena Nunnery, MD Taking Active   Alcohol Swabs (B-D SINGLE USE SWABS REGULAR) PADS [id number] Yes 200 packets by Other route 2 (two) times daily. [place] Manis, Modena Nunnery, MD Taking Active   ALPRAZolam Duanne Moron) 1 MG tablet [id number] Yes TAKE 1 TABLET(1 MG) BY MOUTH TWICE DAILY AS NEEDED [place]uel[place] Manis, Modena Nunnery, MD Taking Active   amLODipine (NORVASC) 10 MG tablet [id number] Yes TAKE 1 TABLET EVERY MORNING [employer], Modena Nunnery, MD Taking Active   aspirin  325 MG tablet [id number] Yes Take 325 mg by mouth every morning. [provider] Taking Active Self  Blood Glucose Calibration ([place]) Yehuda Budd [id number] Yes USE AS DIRECTED Alycia Rossetti, MD Taking Active   Blood Glucose Monitoring Suppl w/Device KIT [id number] Yes TEST FASTING BLOOD SUGAR TWICE PER DAY DX; E11.9 Alycia Rossetti, MD Taking Active   Cholecalciferol (VITAMIN D-3) 1000 UNITS CAPS [id number] Yes Take by mouth. [provider] Taking Active   clobetasol ointment (TEMOVATE) 0.05 % [id number] Yes APPLY IN SORE AREAS OF MOUTH BID. NOT ON FACE/GROIN [provider] Taking Active   co-enzyme Q-10 30 MG capsule [id number] Yes Take 1 capsule (30 mg total) by mouth 3 (three) times daily. Alycia Rossetti, MD Taking Active   famotidine (PEPCID) 40 MG tablet [id number] Yes Take 20 mg by mouth daily. Leta Baptist, MD Taking Active   fluocinonide ointment (LIDEX) 0.05 % [id number] Yes Apply to affected areas of body twice daily. Never on face/groin [provider] Taking Active   fluticasone (FLONASE) 50 MCG/ACT nasal spray [id number] Yes USE 2 SPRAYS IN BOTH NOSTRILS DAILY. [place], Modena Nunnery, MD Taking Active   furosemide (LASIX) 20 MG tablet [id number] Yes Take 1 tablet (20 mg total) by mouth daily as  needed. [place], Modena Nunnery, MD Taking Active   glucose blood [facility]) test strip [id number] Yes Use as directed to monitor FSBS 2x daily. Dx: E11.69 Alycia Rossetti, MD Taking Active   HYDROcodone-acetaminophen [facility]) 5-325 MG tablet [id number] Yes Take 1 tablet by mouth every 6 (six) hours as needed for moderate pain. Alycia Rossetti, MD Taking Active   Lancet Devices (ADJUSTABLE LANCING DEVICE) MISC [id number] Yes TEST FASTING BLOOD SUGAR TWICE PER DAY DX; E11.9 [place], Modena Nunnery, MD Taking Active   lidocaine (XYLOCAINE) 2 % solution [id number] Yes Use as directed 5-10 mLs in the mouth or throat 2 (two) times daily. Delsa Grana, PA-C Taking Active   LINZESS 145 MCG CAPS capsule [id number] Yes TAKE 1 CAPSULE (145 MCG TOTAL) BY MOUTH DAILY BEFORE BREAKFAST. [place], Modena Nunnery, MD Taking Active   losartan (COZAAR) 50 MG tablet [id number] Yes Take 1 tablet (50 mg total) by mouth daily. [place], Modena Nunnery, MD Taking Active   meclizine (ANTIVERT) 12.5 MG tablet [id number] Yes Take 1 tablet (12.5 mg total) by mouth 3 (three) times daily as needed for dizziness. [place], Modena Nunnery, MD Taking Active   metFORMIN (GLUCOPHAGE) 500 MG tablet [id number] Yes Take 1 tablet (500 mg total) by mouth 2 (two) times daily with a meal. [place], Modena Nunnery, MD Taking Active   methocarbamol (ROBAXIN) 500 MG tablet [id number] Yes Take 1 tablet (500 mg total) by mouth every 6 (six) hours as needed for muscle spasms. [place], Modena Nunnery, MD Taking Active   methotrexate [facility]) 2.5 MG tablet [id number] Yes Take 3 tablets (7.5 mg total) by mouth once a week. Caution:Chemotherapy. Protect from light. Alycia Rossetti, MD Taking Active   Multiple Vitamins-Minerals (CENTRUM ADULTS PO) [id number] Yes Take by mouth. [provider] Taking Active   naproxen (NAPROSYN) 500 MG tablet [id number] Yes TAKE 1 TABLET TWICE DAILY WITH MEALS [place], Modena Nunnery, MD Taking Active   omeprazole (PRILOSEC) 20 MG capsule [id number] Yes  TAKE 1 CAPSULE EVERY DAY [place], Modena Nunnery, MD Taking Active   polyethylene glycol powder (GLYCOLAX/MIRALAX) 17 GM/SCOOP powder [id number] Yes MIX 1 CAPFUL (17GM) IN 8 OUNCES OF WATER AND DRINK EVERY DAY , Modena Nunnery, MD Taking Active   rosuvastatin (  CRESTOR) 10 MG tablet [id number] Yes Take 1 tablet (10 mg total) by mouth at bedtime. Alycia Rossetti, MD Taking Active   spironolactone (ALDACTONE) 25 MG tablet [id number] Yes TAKE 1 TABLET (25 MG TOTAL) BY MOUTH EVERY MORNING. [place], Modena Nunnery, MD Taking Active   tacrolimus (PROGRAF) 1 MG capsule [id number] Yes Take by mouth. [provider] Taking Active   triamcinolone cream (KENALOG) 0.1 % [id number] Yes APPLY TOPICALLY TO VULVA 2 TIMES DAILY [place], Modena Nunnery, MD Taking Active   vitamin B-12 (CYANOCOBALAMIN) 1000 MCG tablet [id number] Yes Take 1 tablet (1,000 mcg total) by mouth daily. Alycia Rossetti, MD Taking Active           Patient Active Problem List   Diagnosis Date Noted  . DDD (degenerative disc disease), lumbar 11/22/2018  . Lichen planus 24/40/1027  . Varicose vein of leg 12/17/2017  . Retinopathy due to secondary diabetes mellitus, without macular edema, with mild nonproliferative retinopathy ([place]) 09/17/2017  . Allergic rhinitis 01/04/2016  . Leg swelling 05/25/2015  . Chest congestion 11/17/2014  . Hoarse voice quality 07/22/2014  . Osteoarthritis of right knee 12/31/2012  . History of  Endometrial cancer ([place]) 08/25/2012  . Neuralgia 02/26/2012  . HTN, goal below 130/80 06/29/2011  . Carotid artery bruit 05/15/2011  . OTHER DYSPHAGIA 12/08/2010  . CONSTIPATION, CHRONIC 12/01/2009  . Diabetes mellitus type 2 in obese ([place]) 03/31/2009  . Class 3 obesity 01/24/2007  . Hyperlipidemia 09/25/2006  . Anxiety state 09/25/2006  . GLAUCOMA NOS 09/25/2006  . GERD 09/25/2006  . Chronic pain 09/25/2006    Immunization History  Administered Date(s) Administered  . Fluad Quad([place] Dose 65+) 07/06/2020  .  Influenza Whole 10/16/2004, 08/20/2006, 08/22/2007, 08/11/2008, 07/20/2010, 06/28/2011  . Influenza,inj,Quad PF,6+ Mos 07/22/2014, 08/27/2015, 09/11/2016, 08/14/2017, 06/20/2018, 06/04/2019  . Influenza-Unspecified 09/15/2013  . Moderna Sars-Covid-2 Vaccination 12/14/2019, 01/17/2020, 09/30/2020  . Pneumococcal Polysaccharide-23 03/21/2010, 07/06/2020  . Td 03/21/2010  . Tdap 03/21/2010  . Zoster Recombinat (Shingrix) 07/08/2019, 07/08/2019, 09/02/2019    Conditions to be addressed/monitored:  HTN, GERD, allergic rhinitis, Type II DM, hyperlipidemia, anxiety, osteoarthritis.  Care Plan : General Pharmacy (Adult)  Updates made by Edythe Clarity, RPH since 11/23/2020 12:00 AM    Problem: HTN, HLD, DM, GERD   Priority: [place]  Onset Date: 11/23/2020    Long-Range Goal: Patient-Specific Goal   Start Date: 11/23/2020  Expected End Date: 03/23/2021  This Visit's Progress: On track  Priority: [place]  Note:   Current Barriers:  . Unable to achieve control of acid reflux   Pharmacist Clinical Goal(s):  Marland Kitchen Over the next 120 days, patient will adhere to plan to optimize therapeutic regimen for GERD as evidenced by report of adherence to recommended medication management changes through collaboration with PharmD and provider.    Interventions: . 1:1 collaboration with Buelah Manis, Modena Nunnery, MD regarding development and update of comprehensive plan of care as evidenced by provider attestation and co-signature . Inter-disciplinary care team collaboration (see longitudinal plan of care) . Comprehensive medication review performed; medication list updated in electronic medical record  Hypertension (BP goal <130/80) -controlled -Current treatment: . Amlodipine $RemoveBefo'10mg'gwfznuGvSEy$  . Losartan $RemoveBe'50mg'GhjGgVLCd$  daily . Spironolactone $RemoveBeforeDE'25mg'YpvuZhwRSiwpeXt$  daily -Medications previously tried: lisinopril (cough)   -Current home readings: does not monitor at home -Current dietary habits: working on portion control, patient reports recent weight loss  of 7 pounds -Current exercise habits: minimal -Denies hypotensive/hypertensive symptoms -Educated on BP goals and benefits of medications for prevention of heart attack, stroke and kidney damage;  Exercise goal of 150 minutes per week; Importance of home blood pressure monitoring; -Counseled to monitor BP at home periodically, document, and provide log at future appointments -Counseled on diet and exercise extensively Recommended to continue current medication  Hyperlipidemia: (LDL goal < 70) -controlled -Current treatment: . Rosuvastatin $RemoveBefore'10mg'EQMyugNlIgbZu$  hs -Medications previously tried: none noted  -Current dietary patterns: working on portion control -Current exercise habits: none -Educated on Cholesterol goals;  Benefits of statin for ASCVD risk reduction; -Recommended to continue current medication  Diabetes (A1c goal <7%) -controlled -Current medications: Marland Kitchen Metformin $RemoveBeforeDEI'500mg'XGgmUtSUrhILiIuW$  bid with a meal -Medications previously tried: none noted  -Current home glucose readings . fasting glucose:  o 115, 109, 121, 126, 125, 102, 127, 107, 119, 112 . post prandial glucose: N/A -Denies hypoglycemic/hyperglycemic symptoms -Current meal patterns:  . No specifics, she is working on portion control and reports recent weight loss.  Also beverages mainly include water. -Current exercise: none -Educated onA1c and blood sugar goals; Exercise goal of 150 minutes per week; Benefits of weight loss; -Counseled to check feet daily and get yearly eye exams -Counseled on diet and exercise extensively Recommended to continue current medication   GERD (Goal: Minimize symptoms) Patient was tapering off PPI. - was not successful -uncontrolled -Current treatment  . Famotidine 20 mg daily . Omeprazole $RemoveBefo'20mg'CojfhtkWQDx$  daily -Medications previously tried: none noted  - She reports sometimes she feels like her food is coming up her throat  -Recommended to continue current medication Counseled on timing of medication,  patient was taking with applesauce in the morning.  Asked her to try and take this 30-60 minutes before her first meal or snack or other medication daily Recommended she work on trigger foods   Patient Goals/Self-Care Activities . Over the next 120 days, patient will:  - take medications as prescribed Work on taking Omeprazole $RemoveBeforeDEI'20mg'JPXvArWAcxmIEnEz$  30-60 minutes before first main meal of the day on empty stomach  Follow Up Plan: The care management team will reach out to the patient again over the next 120 days.         Medication Assistance: None required.  Patient affirms current coverage meets needs.  Patient's preferred pharmacy is:  [id number] - [place], [place] - [address] AT [place] [place] [address] Phone: [phone] Fax: [phone]  [place] [address], [address] [place], [place] [address] Phone: [phone] Fax: [facility] [place], [address] [place] [address] Phone: [phone] Fax: [phone]  Uses pill box? Yes Pt endorses 100% compliance  We discussed: Current pharmacy is preferred with insurance plan and patient is satisfied with pharmacy services Patient decided to: Continue current medication management strategy  Follow Up:  Patient agrees to Care Plan and Follow-up.  Plan: The patient has been provided with contact information for the care management team and has been advised to call with any health related questions or concerns.   Beverly Milch, PharmD Clinical Pharmacist [place] [phone]

## 2020-11-23 ENCOUNTER — Ambulatory Visit (INDEPENDENT_AMBULATORY_CARE_PROVIDER_SITE_OTHER): Payer: Medicare Other | Admitting: Pharmacist

## 2020-11-23 DIAGNOSIS — E669 Obesity, unspecified: Secondary | ICD-10-CM

## 2020-11-23 DIAGNOSIS — I10 Essential (primary) hypertension: Secondary | ICD-10-CM

## 2020-11-23 DIAGNOSIS — E1169 Type 2 diabetes mellitus with other specified complication: Secondary | ICD-10-CM

## 2020-11-23 DIAGNOSIS — K219 Gastro-esophageal reflux disease without esophagitis: Secondary | ICD-10-CM

## 2020-11-23 DIAGNOSIS — E7800 Pure hypercholesterolemia, unspecified: Secondary | ICD-10-CM

## 2020-11-23 NOTE — Patient Instructions (Addendum)
Visit Information  Goals Addressed            This Visit's Progress   . Manage My Medicine       Timeframe:  Short-Term Goal Priority:  Medium Start Date:     11/23/20                        Expected End Date:  03/23/21                     Follow Up Date 01/13/21   - use a pillbox to sort medicine  - Take Omeprazole 20mg  30-60 minutes before meal on an empty stomach for best efficacy   Why is this important?   . These steps will help you keep on track with your medicines.   Notes:       Patient Care Plan: General Pharmacy (Adult)    Problem Identified: HTN, HLD, DM, GERD   Priority: [place]  Onset Date: 11/23/2020    Long-Range Goal: Patient-Specific Goal   Start Date: 11/23/2020  Expected End Date: 03/23/2021  This Visit's Progress: On track  Priority: [place]  Note:   Current Barriers:  . Unable to achieve control of acid reflux   Pharmacist Clinical Goal(s):  Marland Kitchen Over the next 120 days, patient will adhere to plan to optimize therapeutic regimen for GERD as evidenced by report of adherence to recommended medication management changes through collaboration with PharmD and provider.    Interventions: . 1:1 collaboration with Buelah Manis, Modena Nunnery, MD regarding development and update of comprehensive plan of care as evidenced by provider attestation and co-signature . Inter-disciplinary care team collaboration (see longitudinal plan of care) . Comprehensive medication review performed; medication list updated in electronic medical record  Hypertension (BP goal <130/80) -controlled -Current treatment: . Amlodipine 10mg  . Losartan 50mg  daily . Spironolactone 25mg  daily -Medications previously tried: lisinopril (cough)   -Current home readings: does not monitor at home -Current dietary habits: working on portion control, patient reports recent weight loss of 7 pounds -Current exercise habits: minimal -Denies hypotensive/hypertensive symptoms -Educated on BP goals and benefits  of medications for prevention of heart attack, stroke and kidney damage; Exercise goal of 150 minutes per week; Importance of home blood pressure monitoring; -Counseled to monitor BP at home periodically, document, and provide log at future appointments -Counseled on diet and exercise extensively Recommended to continue current medication  Hyperlipidemia: (LDL goal < 70) -controlled -Current treatment: . Rosuvastatin 10mg  hs -Medications previously tried: none noted  -Current dietary patterns: working on portion control -Current exercise habits: none -Educated on Cholesterol goals;  Benefits of statin for ASCVD risk reduction; -Recommended to continue current medication  Diabetes (A1c goal <7%) -controlled -Current medications: Marland Kitchen Metformin 500mg  bid with a meal -Medications previously tried: none noted  -Current home glucose readings . fasting glucose:  o 115, 109, 121, 126, 125, 102, 127, 107, 119, 112 . post prandial glucose: N/A -Denies hypoglycemic/hyperglycemic symptoms -Current meal patterns:  . No specifics, she is working on portion control and reports recent weight loss.  Also beverages mainly include water. -Current exercise: none -Educated onA1c and blood sugar goals; Exercise goal of 150 minutes per week; Benefits of weight loss; -Counseled to check feet daily and get yearly eye exams -Counseled on diet and exercise extensively Recommended to continue current medication   GERD (Goal: Minimize symptoms) Patient was tapering off PPI. - was not successful -uncontrolled -Current treatment  . Famotidine 20 mg  daily . Omeprazole 20mg  daily -Medications previously tried: none noted  - She reports sometimes she feels like her food is coming up her throat  -Recommended to continue current medication Counseled on timing of medication, patient was taking with applesauce in the morning.  Asked her to try and take this 30-60 minutes before her first meal or snack or  other medication daily Recommended she work on trigger foods   Patient Goals/Self-Care Activities . Over the next 120 days, patient will:  - take medications as prescribed Work on taking Omeprazole 20mg  30-60 minutes before first main meal of the day on empty stomach  Follow Up Plan: The care management team will reach out to the patient again over the next 120 days.         The patient verbalized understanding of instructions, educational materials, and care plan provided today and agreed to receive a mailed copy of patient instructions, educational materials, and care plan.   Telephone follow up appointment with pharmacy team member scheduled for: 4 months  Edythe Clarity, [place] [facility] Gastroes[facility] Reflux Disease, Adult When you have gastroesophageal reflux disease (GERD), the foods you eat and your eating habits are very important. Choosing the right foods can help ease your discomfort. Think about working with a food expert (dietitian) to help you make good choices. What are tips for following this plan? Reading food labels  Look for foods that are low in saturated fat. Foods that may help with your symptoms include: ? Foods that have less than 5% of daily value (DV) of fat. ? Foods that have 0 grams of trans fat. Cooking  Do not fry your food.  Cook your food by baking, steaming, grilling, or broiling. These are all methods that do not need a lot of fat for cooking.  To add flavor, try to use herbs that are low in spice and acidity. Meal planning  Choose healthy foods that are low in fat, such as: ? Fruits and vegetables. ? Whole grains. ? Low-fat dairy products. ? Lean meats, fish, and poultry.  Eat small meals often instead of eating 3 large meals each day. Eat your meals slowly in a place where you are relaxed. Avoid bending over or lying down until 2-3 hours after eating.  Limit [place]-fat foods such as fatty meats or fried foods.  Limit your  intake of fatty foods, such as oils, butter, and shortening.  Avoid the following as told by your doctor: ? Foods that cause symptoms. These may be different for different people. Keep a food diary to keep track of foods that cause symptoms. ? Alcohol. ? Drinking a lot of liquid with meals. ? Eating meals during the 2-3 hours before bed.   Lifestyle  Stay at a healthy weight. Ask your doctor what weight is healthy for you. If you need to lose weight, work with your doctor to do so safely.  Exercise for at least 30 minutes on 5 or more days each week, or as told by your doctor.  Wear loose-fitting clothes.  Do not smoke or use any products that contain nicotine or tobacco. If you need help quitting, ask your doctor.  Sleep with the head of your bed higher than your feet. Use a wedge under the mattress or blocks under the bed frame to raise the head of the bed.  Chew sugar-free gum after meals. What foods should eat? Eat a healthy, well-balanced diet of fruits, vegetables, whole grains, low-fat dairy products,  lean meats, fish, and poultry. Each person is different. Foods that may cause symptoms in one person may not cause any symptoms in another person. Work with your doctor to find foods that are safe for you. The items listed above may not be a complete list of what you can eat and drink. Contact a food expert for more options.   What foods should I avoid? Limiting some of these foods may help in managing the symptoms of GERD. Everyone is different. Talk with a food expert or your doctor to help you find the exact foods to avoid, if any. Fruits Any fruits prepared with added fat. Any fruits that cause symptoms. For some people, this may include citrus fruits, such as oranges, grapefruit, pineapple, and lemons. Vegetables Deep-fried vegetables. [place] fries. Any vegetables prepared with added fat. Any vegetables that cause symptoms. For some people, this may include tomatoes and tomato  products, chili peppers, onions and garlic, and horseradish. Grains Pastries or quick breads with added fat. Meats and other proteins [place]-fat meats, such as fatty beef or pork, hot dogs, ribs, ham, sausage, salami, and bacon. Fried meat or protein, including fried fish and fried chicken. Nuts and nut butters, in large amounts. Dairy Whole milk and chocolate milk. Sour cream. Cream. Ice cream. Cream cheese. Milkshakes. Fats and oils Butter. Margarine. Shortening. Ghee. Beverages Coffee and tea, with or without caffeine. Carbonated beverages. Sodas. Energy drinks. Fruit juice made with acidic fruits, such as orange or grapefruit. Tomato juice. Alcoholic drinks. Sweets and desserts Chocolate and cocoa. Donuts. Seasonings and condiments Pepper. Peppermint and spearmint. Added salt. Any condiments, herbs, or seasonings that cause symptoms. For some people, this may include curry, hot sauce, or vinegar-based salad dressings. The items listed above may not be a complete list of what you should not eat and drink. Contact a food expert for more options. Questions to ask your doctor Diet and lifestyle changes are often the first steps that are taken to manage symptoms of GERD. If diet and lifestyle changes do not help, talk with your doctor about taking medicines. Where to find more information  International Foundation for Gastrointestinal Disorders: aboutgerd.org Summary  When you have GERD, food and lifestyle choices are very important in easing your symptoms.  Eat small meals often instead of 3 large meals a day. Eat your meals slowly and in a place where you are relaxed.  Avoid bending over or lying down until 2-3 hours after eating.  Limit [place]-fat foods such as fatty meats or fried foods. This information is not intended to replace advice given to you by your health care provider. Make sure you discuss any questions you have with your health care provider. Document Revised: 04/12/2020  Document Reviewed: 04/12/2020 Elsevier Patient Education  [place].

## 2020-12-10 ENCOUNTER — Telehealth: Payer: Self-pay | Admitting: Pharmacist

## 2020-12-10 NOTE — Progress Notes (Signed)
Chronic Care Management Pharmacy Assistant   Name: [name] [name]  MRN: [id number] DOB: Jan 03, 1956  Reason for Encounter: Adherence Reivew   PCP : Alycia Rossetti, MD  Office Visits: None since 11/23/20  Consults: None since 11/23/20  Allergies:   Allergies  Allergen Reactions  . Contrast Media [Iodinated Diagnostic Agents] Hives  . Lisinopril     REACTION: cough  . Buspar [Buspirone Hcl] Palpitations    Medications: Outpatient Encounter Medications as of 12/10/2020  Medication Sig  . Accu-Chek FastClix Lancets MISC TEST BLOOD SUGAR TWICE DAILY  . acitretin (SORIATANE) 25 MG capsule Take 25 mg by mouth daily.   Marland Kitchen albuterol (VENTOLIN HFA) 108 (90 Base) MCG/ACT inhaler INHALE 2 PUFFS INTO THE LUNGS EVERY 4 HOURS AS NEEDED FOR WHEEZING OR SHORTNESS OF BREATH  . Alcohol Swabs (B-D SINGLE USE SWABS REGULAR) PADS 200 packets by Other route 2 (two) times daily.  Marland Kitchen ALPRAZolam (XANAX) 1 MG tablet TAKE 1 TABLET(1 MG) BY MOUTH TWICE DAILY AS NEEDED  . amLODipine (NORVASC) 10 MG tablet TAKE 1 TABLET EVERY MORNING  . aspirin 325 MG tablet Take 325 mg by mouth every morning.  . Blood Glucose Calibration (ACCU-CHEK SMARTVIEW CONTROL) LIQD USE AS DIRECTED  . Blood Glucose Monitoring Suppl w/Device KIT TEST FASTING BLOOD SUGAR TWICE PER DAY DX; E11.9  . Cholecalciferol (VITAMIN D-3) 1000 UNITS CAPS Take by mouth.  . clobetasol ointment (TEMOVATE) 0.05 % APPLY IN SORE AREAS OF MOUTH BID. NOT ON FACE/GROIN  . co-enzyme Q-10 30 MG capsule Take 1 capsule (30 mg total) by mouth 3 (three) times daily.  . famotidine (PEPCID) 40 MG tablet Take 20 mg by mouth daily.  . fluocinonide ointment (LIDEX) 0.05 % Apply to affected areas of body twice daily. Never on face/groin  . fluticasone (FLONASE) 50 MCG/ACT nasal spray USE 2 SPRAYS IN BOTH NOSTRILS DAILY.  . furosemide (LASIX) 20 MG tablet Take 1 tablet (20 mg total) by mouth daily as needed.  Marland Kitchen glucose blood (ACCU-CHEK SMARTVIEW) test strip Use as  directed to monitor FSBS 2x daily. Dx: E11.69  . HYDROcodone-acetaminophen (NORCO) 5-325 MG tablet Take 1 tablet by mouth every 6 (six) hours as needed for moderate pain.  Elmore Guise Devices (ADJUSTABLE LANCING DEVICE) MISC TEST FASTING BLOOD SUGAR TWICE PER DAY DX; E11.9  . lidocaine (XYLOCAINE) 2 % solution Use as directed 5-10 mLs in the mouth or throat 2 (two) times daily.  Marland Kitchen LINZESS 145 MCG CAPS capsule TAKE 1 CAPSULE (145 MCG TOTAL) BY MOUTH DAILY BEFORE BREAKFAST.  Marland Kitchen losartan (COZAAR) 50 MG tablet Take 1 tablet (50 mg total) by mouth daily.  . meclizine (ANTIVERT) 12.5 MG tablet Take 1 tablet (12.5 mg total) by mouth 3 (three) times daily as needed for dizziness.  . metFORMIN (GLUCOPHAGE) 500 MG tablet Take 1 tablet (500 mg total) by mouth 2 (two) times daily with a meal.  . methocarbamol (ROBAXIN) 500 MG tablet Take 1 tablet (500 mg total) by mouth every 6 (six) hours as needed for muscle spasms.  . methotrexate (RHEUMATREX) 2.5 MG tablet Take 3 tablets (7.5 mg total) by mouth once a week. Caution:Chemotherapy. Protect from light.  . Multiple Vitamins-Minerals (CENTRUM ADULTS PO) Take by mouth.  . naproxen (NAPROSYN) 500 MG tablet TAKE 1 TABLET TWICE DAILY WITH MEALS  . omeprazole (PRILOSEC) 20 MG capsule TAKE 1 CAPSULE EVERY DAY  . polyethylene glycol powder (GLYCOLAX/MIRALAX) 17 GM/SCOOP powder MIX 1 CAPFUL (17GM) IN 8 OUNCES OF WATER AND DRINK EVERY  DAY  . rosuvastatin (CRESTOR) 10 MG tablet Take 1 tablet (10 mg total) by mouth at bedtime.  Marland Kitchen spironolactone (ALDACTONE) 25 MG tablet TAKE 1 TABLET (25 MG TOTAL) BY MOUTH EVERY MORNING.  Marland Kitchen tacrolimus (PROGRAF) 1 MG capsule Take by mouth.  . triamcinolone cream (KENALOG) 0.1 % APPLY TOPICALLY TO VULVA 2 TIMES DAILY  . vitamin B-12 (CYANOCOBALAMIN) 1000 MCG tablet Take 1 tablet (1,000 mcg total) by mouth daily.   No facility-administered encounter medications on file as of 12/10/2020.    Current Diagnosis: Patient Active Problem List    Diagnosis Date Noted  . DDD (degenerative disc disease), lumbar 11/22/2018  . Lichen planus 85/63/1497  . Varicose vein of leg 12/17/2017  . Retinopathy due to secondary diabetes mellitus, without macular edema, with mild nonproliferative retinopathy ([place]) 09/17/2017  . Allergic rhinitis 01/04/2016  . Leg swelling 05/25/2015  . Chest congestion 11/17/2014  . Hoarse voice quality 07/22/2014  . Osteoarthritis of right knee 12/31/2012  . History of  Endometrial cancer ([place]) 08/25/2012  . Neuralgia 02/26/2012  . HTN, goal below 130/80 06/29/2011  . Carotid artery bruit 05/15/2011  . OTHER DYSPHAGIA 12/08/2010  . CONSTIPATION, CHRONIC 12/01/2009  . Diabetes mellitus type 2 in obese ([place]) 03/31/2009  . Class 3 obesity 01/24/2007  . Hyperlipidemia 09/25/2006  . Anxiety state 09/25/2006  . GLAUCOMA NOS 09/25/2006  . GERD 09/25/2006  . Chronic pain 09/25/2006    Goals Addressed   None    Reviewed the patients chart for any health and/or medication changes, there were none found at this time.  Follow-Up:  Pharmacist Review   Charlann Lange, [place] Pharmacist Assistant [phone]

## 2020-12-13 ENCOUNTER — Other Ambulatory Visit: Payer: Self-pay

## 2020-12-13 ENCOUNTER — Telehealth: Payer: Self-pay | Admitting: Family Medicine

## 2020-12-13 DIAGNOSIS — E1169 Type 2 diabetes mellitus with other specified complication: Secondary | ICD-10-CM

## 2020-12-13 DIAGNOSIS — E669 Obesity, unspecified: Secondary | ICD-10-CM

## 2020-12-13 NOTE — Telephone Encounter (Signed)
Received fax from [facility] requesting accu-check nano smartview meter, accu-chek smartview test strip and accu-chek fastclix [employer]

## 2020-12-14 MED ORDER — ACCU-CHEK SMARTVIEW VI STRP: ORAL_STRIP | 3 refills | Status: DC

## 2020-12-14 MED ORDER — ACCU-CHEK NANO SMARTVIEW W/DEVICE KIT: PACK | 1 refills | Status: DC

## 2020-12-14 MED ORDER — ACCU-CHEK FASTCLIX LANCETS MISC: 3 refills | Status: DC

## 2020-12-14 NOTE — Telephone Encounter (Signed)
Prescription sent to pharmacy.

## 2020-12-20 ENCOUNTER — Telehealth: Payer: Self-pay | Admitting: Family Medicine

## 2020-12-20 MED ORDER — ADJUSTABLE LANCING DEVICE MISC: 0 refills | Status: DC

## 2020-12-20 MED ORDER — BD SWAB SINGLE USE REGULAR PADS: 200.0000 | MEDICATED_PAD | Freq: Two times a day (BID) | 3 refills | Status: DC

## 2020-12-20 NOTE — Telephone Encounter (Signed)
Received a fax from [facility] requesting a refill on accu-check softclix lance dev, t/s accu-chk smartview stp, and alcohol prep pad lard

## 2021-01-17 ENCOUNTER — Telehealth: Payer: Self-pay | Admitting: Family Medicine

## 2021-01-17 ENCOUNTER — Other Ambulatory Visit: Payer: Self-pay

## 2021-01-17 DIAGNOSIS — E669 Obesity, unspecified: Secondary | ICD-10-CM

## 2021-01-17 NOTE — Telephone Encounter (Signed)
Patient called to request the provider order a new glucose machine with supplies; patient has new insurance as of 01/14/2021 ([employer]).   Please advise at [phone]

## 2021-01-17 NOTE — Telephone Encounter (Signed)
Left message on patient's voicemail to inform her that CMA Anitra will call in the order for the glucose monitor; patient not due for another appt until June.

## 2021-01-18 MED ORDER — ACCU-CHEK NANO SMARTVIEW W/DEVICE KIT
PACK | 0 refills | Status: AC
Start: 2021-01-18 — End: ?

## 2021-01-18 MED ORDER — ADJUSTABLE LANCING DEVICE MISC: 0 refills | Status: AC

## 2021-01-18 MED ORDER — ACCU-CHEK SMARTVIEW VI STRP: ORAL_STRIP | 0 refills | Status: DC

## 2021-01-20 ENCOUNTER — Other Ambulatory Visit: Payer: Self-pay | Admitting: *Deleted

## 2021-01-20 MED ORDER — LANCETS MISC: 1 refills | Status: AC

## 2021-01-21 ENCOUNTER — Telehealth: Payer: Self-pay

## 2021-01-21 NOTE — Telephone Encounter (Signed)
Patient left voicemail message to request refill of nighttime med and pain med. Names of medications not specified in message received. Patient's voicemail full - couldn't leave a message to get names of medications or confirm name of pharmacy. Please advise at [phone] when meds called into pharmacy.

## 2021-01-21 NOTE — Telephone Encounter (Signed)
Name of medication required.  Call placed to patient to inquire. No answer. VM full.

## 2021-01-24 NOTE — Telephone Encounter (Signed)
Call placed to patient. No answer. VM full.

## 2021-01-26 ENCOUNTER — Other Ambulatory Visit: Payer: Self-pay | Admitting: Family Medicine

## 2021-01-26 NOTE — Telephone Encounter (Signed)
Pt called needing a refill of  ALPRAZolam (XANAX) 1 MG tablet HYDROcodone-acetaminophen (NORCO) 5-325 MG tablet Sent to [facility] [place]  Cb#: [phone]

## 2021-01-26 NOTE — Telephone Encounter (Signed)
Multiple calls placed to patient with no answer and no return call.   Message to be closed.  

## 2021-01-27 MED ORDER — ALPRAZOLAM 1 MG PO TABS: ORAL_TABLET | ORAL | 1 refills | Status: DC

## 2021-02-08 DIAGNOSIS — K219 Gastro-esophageal reflux disease without esophagitis: Secondary | ICD-10-CM | POA: Diagnosis not present

## 2021-02-08 DIAGNOSIS — R490 Dysphonia: Secondary | ICD-10-CM | POA: Diagnosis not present

## 2021-03-01 DIAGNOSIS — M65311 Trigger thumb, right thumb: Secondary | ICD-10-CM | POA: Diagnosis not present

## 2021-03-10 ENCOUNTER — Ambulatory Visit (INDEPENDENT_AMBULATORY_CARE_PROVIDER_SITE_OTHER): Payer: Medicare Other | Admitting: Nurse Practitioner

## 2021-03-10 ENCOUNTER — Other Ambulatory Visit: Payer: Self-pay

## 2021-03-10 ENCOUNTER — Encounter: Payer: Self-pay | Admitting: Nurse Practitioner

## 2021-03-10 VITALS — BP 128/80 | HR 63 | Temp 98.0°F | Ht 62.0 in | Wt 228.0 lb

## 2021-03-10 DIAGNOSIS — E1169 Type 2 diabetes mellitus with other specified complication: Secondary | ICD-10-CM | POA: Diagnosis not present

## 2021-03-10 DIAGNOSIS — F411 Generalized anxiety disorder: Secondary | ICD-10-CM | POA: Diagnosis not present

## 2021-03-10 DIAGNOSIS — M5136 Other intervertebral disc degeneration, lumbar region: Secondary | ICD-10-CM | POA: Diagnosis not present

## 2021-03-10 DIAGNOSIS — E7800 Pure hypercholesterolemia, unspecified: Secondary | ICD-10-CM

## 2021-03-10 DIAGNOSIS — I10 Essential (primary) hypertension: Secondary | ICD-10-CM | POA: Diagnosis not present

## 2021-03-10 DIAGNOSIS — E669 Obesity, unspecified: Secondary | ICD-10-CM
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
## 2021-03-11 LAB — COMPLETE METABOLIC PANEL WITH GFR
AG Ratio: 1.2 (calc) (ref 1.0–2.5)
ALT: 12 U/L (ref 6–29)
AST: 17 U/L (ref 10–35)
Albumin: 4 g/dL (ref 3.6–5.1)
Alkaline phosphatase (APISO): 119 U/L (ref 37–153)
BUN: 15 mg/dL (ref 7–25)
CO2: 31 mmol/L (ref 20–32)
Calcium: 9.4 mg/dL (ref 8.6–10.4)
Chloride: 99 mmol/L (ref 98–110)
Creat: 0.93 mg/dL (ref 0.50–0.99)
GFR, Est African American: 75 mL/min/{1.73_m2} (ref >=60)
GFR, Est Non African American: 64 mL/min/{1.73_m2} (ref >=60)
Globulin: 3.3 g/dL (calc) (ref 1.9–3.7)
Glucose, Bld: 116 mg/dL — ABNORMAL HIGH (ref 65–99)
Potassium: 4.5 mmol/L (ref 3.5–5.3)
Sodium: 138 mmol/L (ref 135–146)
Total Bilirubin: 0.5 mg/dL (ref 0.2–1.2)
Total Protein: 7.3 g/dL (ref 6.1–8.1)

## 2021-03-11 LAB — LIPID PANEL
Cholesterol: 130 mg/dL (ref <200)
HDL: 51 mg/dL (ref >=50)
LDL Cholesterol (Calc): 62 mg/dL (calc)
Non-HDL Cholesterol (Calc): 79 mg/dL (calc) (ref <130)
Total CHOL/HDL Ratio: 2.5 (calc) (ref <5.0)
Triglycerides: 89 mg/dL (ref <150)

## 2021-03-11 LAB — CBC WITH DIFFERENTIAL/PLATELET
Absolute Monocytes: 570 cells/uL (ref 200–950)
Basophils Absolute: 69 cells/uL (ref 0–200)
Basophils Relative: 0.9 %
Eosinophils Absolute: 208 cells/uL (ref 15–500)
Eosinophils Relative: 2.7 %
HCT: 36.4 % (ref 35.0–45.0)
Hemoglobin: 11.5 g/dL — ABNORMAL LOW (ref 11.7–15.5)
Lymphs Abs: 1740 cells/uL (ref 850–3900)
MCH: 27.8 pg (ref 27.0–33.0)
MCHC: 31.6 g/dL — ABNORMAL LOW (ref 32.0–36.0)
MCV: 88.1 fL (ref 80.0–100.0)
MPV: 9.5 fL (ref 7.5–12.5)
Monocytes Relative: 7.4 %
Neutro Abs: 5113 cells/uL (ref 1500–7800)
Neutrophils Relative %: 66.4 %
Platelets: 304 10*3/uL (ref 140–400)
RBC: 4.13 10*6/uL (ref 3.80–5.10)
RDW: 14.4 % (ref 11.0–15.0)
Total Lymphocyte: 22.6 %
WBC: 7.7 10*3/uL (ref 3.8–10.8)

## 2021-03-11 LAB — HEMOGLOBIN A1C
Hgb A1c MFr Bld: 6.5 % of total Hgb — ABNORMAL HIGH (ref <5.7)
Mean Plasma Glucose: 140 mg/dL
eAG (mmol/L): 7.7 mmol/L

## 2021-03-12 MED ORDER — HYDROCODONE-ACETAMINOPHEN 5-325 MG PO TABS: 1.0000 | ORAL_TABLET | Freq: Four times a day (QID) | ORAL | 0 refills | Status: DC | PRN

## 2021-03-12 NOTE — Assessment & Plan Note (Signed)
Chronic.  Will check A1c today.  It sounds like fasting blood sugars have been at goal at home.  Continue metformin 500 mg twice daily.  Goal A1c less than 7%.  Eye exam is up-to-date and foot exam is up-to-date.  Follow-up with new PCP.

## 2021-03-12 NOTE — Assessment & Plan Note (Signed)
Chronic.  Discussed alprazolam 1 mg nightly for sleep.  Pt is aware of risks of psychoactive medication use to include increased sedation, respiratory suppression, falls, extrapyramidal movements,  dependence and cardiovascular events.  Pt would like to continue treatment as benefit determined to outweigh risk.  PDMP reviewed and appropriate.  Refills not given today-not needed.  Discussed with patient that there may be alternative medications to help with her sleep that she can try that are not habit-forming.  This will be up to her new PCP to decide.

## 2021-03-12 NOTE — Assessment & Plan Note (Signed)
Chronic.  Takes Norco sparingly -a few times per week.  PDMP review shows this was last filled in January.  Will refill today, however may benefit from following up with orthopedic provider for ongoing knee pain.  Encouraged increasing physical activity slowly to help with weight loss and also chronic pain.  Follow-up with new PCP.

## 2021-03-12 NOTE — Assessment & Plan Note (Signed)
Chronic.  LDL goal less than 70.  We will check lipids today in clinic continue rosuvastatin for now.  Follow-up with new PCP.

## 2021-03-12 NOTE — Assessment & Plan Note (Addendum)
Chronic.  Blood pressure at goal today in office.  We will plan to continue Aldactone 25 mg, losartan 50 mg, and amlodipine 10 mg daily.  Continue furosemide as needed for swelling.  We will check kidney function with electrolytes today.  Patient to continue to monitor blood pressure at home and notify with results greater than 130/80.  Follow-up with new PCP.

## 2021-03-15 DIAGNOSIS — Z5181 Encounter for therapeutic drug level monitoring: Secondary | ICD-10-CM | POA: Diagnosis not present

## 2021-03-15 DIAGNOSIS — L439 Lichen planus, unspecified: Secondary | ICD-10-CM | POA: Diagnosis not present

## 2021-03-15 DIAGNOSIS — L438 Other lichen planus: Secondary | ICD-10-CM | POA: Diagnosis not present

## 2021-03-24 ENCOUNTER — Telehealth: Payer: Self-pay

## 2021-03-24 NOTE — Progress Notes (Deleted)
Chronic Care Management Pharmacy Note  03/24/2021 Name:  [name] [name] MRN:  [id number] DOB:  June 14, 1956  Summary:   Recommendations/Changes made from today's visit:   Plan:    Subjective: [name] [name] is an 65 y.o. year old female who is a primary patient of No primary care provider on file..  The CCM team was consulted for assistance with disease management and care coordination needs.    {CCMTELEPHONEFACETOFACE:21091510} for {CCMINITIALFOLLOWUPCHOICE:21091511} in response to provider referral for pharmacy case management and/or care coordination services.   Consent to Services:  {CCMCONSENTOPTIONS:25074}  Patient Care Team: Josue Hector, MD (Cardiology) Edythe Clarity, [facility] as Pharmacist (Pharmacist)  Recent office visits:   Recent consult visits:   Hospital visits: {Hospital DC Yes/No:25215}   Objective:  Lab Results  Component Value Date   CREATININE 0.93 03/10/2021   BUN 15 03/10/2021   GFRNONAA 64 03/10/2021   GFRAA 75 03/10/2021   NA 138 03/10/2021   K 4.5 03/10/2021   CALCIUM 9.4 03/10/2021   CO2 31 03/10/2021   GLUCOSE 116 (H) 03/10/2021    Lab Results  Component Value Date/Time   HGBA1C 6.5 (H) 03/10/2021 11:11 AM   HGBA1C 6.6 (H) 11/10/2020 02:33 PM   MICROALBUR 0.8 03/08/2020 03:52 PM   MICROALBUR 1.0 06/20/2018 02:45 PM    Last diabetic Eye exam:  Lab Results  Component Value Date/Time   HMDIABEYEEXA No Retinopathy 11/11/2020 11:01 AM    Last diabetic Foot exam:  Lab Results  Component Value Date/Time   HMDIABFOOTEX yes  11/03/2010 12:00 AM     Lab Results  Component Value Date   CHOL 130 03/10/2021   HDL 51 03/10/2021   LDLCALC 62 03/10/2021   TRIG 89 03/10/2021   CHOLHDL 2.5 03/10/2021    Hepatic Function Latest Ref Rng & Units 03/10/2021 11/10/2020 07/06/2020  Total Protein 6.1 - 8.1 g/dL 7.3 7.2 7.3  Albumin 3.6 - 5.1 g/dL - - -  AST 10 - 35 U/L $Remo'17 15 15  'novEs$ ALT 6 - 29 U/L $Remo'12 9 9  'STdJa$ Alk Phosphatase 33 - 130 U/L -  - -  Total Bilirubin 0.2 - 1.2 mg/dL 0.5 0.6 0.4  Bilirubin, Direct 0.0 - 0.3 mg/dL - - -    Lab Results  Component Value Date/Time   TSH 1.34 01/09/2017 12:24 PM   TSH 1.890 08/15/2012 09:13 AM    CBC Latest Ref Rng & Units 03/10/2021 11/10/2020 07/06/2020  WBC 3.8 - 10.8 Thousand/uL 7.7 6.5 7.4  Hemoglobin 11.7 - 15.5 g/dL 11.5(L) 11.4(L) 11.4(L)  Hematocrit 35.0 - 45.0 % 36.4 34.7(L) 35.2  Platelets 140 - 400 Thousand/uL 304 326 284    Lab Results  Component Value Date/Time   VD25OH 55 07/21/2014 10:33 AM   VD25OH 31 08/15/2012 09:13 AM    Clinical ASCVD: {YES/NO:21197} The 10-year ASCVD risk score Mikey Bussing DC Jr., et al., 2013) is: 19.4%   Values used to calculate the score:     Age: 32 years     Sex: Female     Is Non-Hispanic African American: Yes     Diabetic: Yes     Tobacco smoker: No     Systolic Blood Pressure: 349 mmHg     Is BP treated: Yes     HDL Cholesterol: 51 mg/dL     Total Cholesterol: 130 mg/dL    Depression screen [facility] 2/9 03/12/2021 11/10/2020 03/08/2020  Decreased Interest 0 0 0  Down, Depressed, Hopeless 0 0 0  PHQ -  2 Score 0 0 0  Altered sleeping 3 - 1  Tired, decreased energy 2 - 1  Change in appetite 0 - 0  Feeling bad or failure about yourself  0 - 0  Trouble concentrating 0 - 0  Moving slowly or fidgety/restless 0 - 0  Suicidal thoughts 0 - 0  PHQ-9 Score 5 - 2  Difficult doing work/chores Not difficult at all - Somewhat difficult  Some recent data might be hidden     ***Other: (CHADS2VASc if Afib, MMRC or CAT for COPD, ACT, DEXA)  Social History   Tobacco Use  Smoking Status Never  Smokeless Tobacco Never   BP Readings from Last 3 Encounters:  03/10/21 128/80  11/10/20 128/78  07/06/20 130/78   Pulse Readings from Last 3 Encounters:  03/10/21 63  11/10/20 72  07/06/20 68   Wt Readings from Last 3 Encounters:  03/10/21 228 lb (103.4 kg)  11/10/20 233 lb (105.7 kg)  07/06/20 240 lb (108.9 kg)   BMI Readings from Last 3  Encounters:  03/10/21 41.70 kg/m  11/10/20 42.62 kg/m  07/06/20 43.90 kg/m    Assessment/Interventions: Review of patient past medical history, allergies, medications, health status, including review of consultants reports, laboratory and other test data, was performed as part of comprehensive evaluation and provision of chronic care management services.   SDOH:  (Social Determinants of Health) assessments and interventions performed: {yes/no:20286}  SDOH Screenings   Alcohol Screen: Low Risk    Last Alcohol Screening Score (AUDIT): 0  Depression (PHQ2-9): Medium Risk   PHQ-2 Score: 5  Financial Resource Strain: Low Risk    Difficulty of Paying Living Expenses: Not very hard  Food Insecurity: Not on file  Housing: Not on file  Physical Activity: Not on file  Social Connections: Not on file  Stress: Not on file  Tobacco Use: Low Risk    Smoking Tobacco Use: Never   Smokeless Tobacco Use: Never  Transportation Needs: Not on file    CCM Care Plan  Allergies  Allergen Reactions   Contrast Media [Iodinated Diagnostic Agents] Hives   Lisinopril     REACTION: cough   Buspar [Buspirone Hcl] Palpitations    Medications Reviewed Today     Reviewed by Amalia Hailey, CMA (Certified Medical Assistant) on 03/10/21 at [place] Status: <None>   Medication Order Taking? Sig Documenting Provider Last Dose Status Informant  acitretin (SORIATANE) 25 MG capsule [id number]  Take 25 mg by mouth daily.  [provider]  Active   albuterol (VENTOLIN HFA) 108 (90 Base) MCG/ACT inhaler [id number]  INHALE 2 PUFFS INTO THE LUNGS EVERY 4 HOURS AS NEEDED FOR WHEEZING OR SHORTNESS OF BREATH [place], Modena Nunnery, MD  Active   Alcohol Swabs (B-D SINGLE USE SWABS REGULAR) PADS [id number]  200 packets by Other route 2 (two) times daily. [place] Manis, Modena Nunnery, MD  Active   ALPRAZolam Duanne Moron) 1 MG tablet [id number]  TAKE 1 TABLET(1 MG) BY MOUTH TWICE DAILY AS NEEDED Susy Frizzle, MD  Active    amLODipine (NORVASC) 10 MG tablet [id number]  TAKE 1 TABLET EVERY MORNING [place], Modena Nunnery, MD  Active   aspirin 325 MG tablet [id number]  Take 325 mg by mouth every morning. [provider]  Active Self  Blood Glucose Calibration (ACCU-CHEK SMARTVIEW CONTROL) LIQD [id number]  USE AS DIRECTED Buelah Manis Modena Nunnery, MD  Active   Blood Glucose Monitoring Suppl (ACCU-CHEK NANO SMARTVIEW) w/Device KIT [id number]  Use as directed to monitor FSBS  2x daily. Dx: E11.69 Salley Scarlet, MD  Active   Cholecalciferol (VITAMIN D-3) 1000 UNITS CAPS [id number]  Take by mouth. [provider]  Active   clobetasol ointment (TEMOVATE) 0.05 % [id number]  APPLY IN SORE AREAS OF MOUTH BID. NOT ON FACE/GROIN [provider]  Active   co-enzyme Q-10 30 MG capsule [id number]  Take 1 capsule (30 mg total) by mouth 3 (three) times daily. Salley Scarlet, MD  Active   famotidine (PEPCID) 40 MG tablet [id number]  Take 20 mg by mouth daily. Newman Pies, MD  Active   fluocinonide ointment (LIDEX) 0.05 % [id number]  Apply to affected areas of body twice daily. Never on face/groin [provider]  Active   fluticasone (FLONASE) 50 MCG/ACT nasal spray [id number]  USE 2 SPRAYS IN BOTH NOSTRILS DAILY. [place], Velna Hatchet, MD  Active   furosemide (LASIX) 20 MG tablet [id number]  Take 1 tablet (20 mg total) by mouth daily as needed. [place], Velna Hatchet, MD  Active   glucose blood [facility]) test strip [id number]  Use as directed to monitor FSBS 2x daily. Dx: E11.69 Salley Scarlet, MD  Active   HYDROcodone-acetaminophen [facility]) 5-325 MG tablet [id number]  Take 1 tablet by mouth every 6 (six) hours as needed for moderate pain. Salley Scarlet, MD  Active   Lancet Devices (ADJUSTABLE LANCING DEVICE) MISC [id number]  TEST FASTING BLOOD SUGAR TWICE PER DAY DX; E11.9 Salley Scarlet, MD  Active   Lancets MISC [id number]  Use as directed to monitor FSBS 2x daily. Dx: E11.9. Donita Brooks, MD  Active    lidocaine (XYLOCAINE) 2 % solution [id number]  Use as directed 5-10 mLs in the mouth or throat 2 (two) times daily. Danelle Berry, PA-C  Active   LINZESS 145 MCG CAPS capsule [id number]  TAKE 1 CAPSULE (145 MCG TOTAL) BY MOUTH DAILY BEFORE BREAKFAST. [place], Velna Hatchet, MD  Active   losartan (COZAAR) 50 MG tablet [id number]  Take 1 tablet (50 mg total) by mouth daily. [place], Velna Hatchet, MD  Active   meclizine (ANTIVERT) 12.5 MG tablet [id number]  Take 1 tablet (12.5 mg total) by mouth 3 (three) times daily as needed for dizziness. [place], Velna Hatchet, MD  Active   metFORMIN (GLUCOPHAGE) 500 MG tablet [id number]  Take 1 tablet (500 mg total) by mouth 2 (two) times daily with a meal. [place], Velna Hatchet, MD  Active   methocarbamol (ROBAXIN) 500 MG tablet [id number]  Take 1 tablet (500 mg total) by mouth every 6 (six) hours as needed for muscle spasms. [place], Velna Hatchet, MD  Active   methotrexate [facility]) 2.5 MG tablet [id number]  Take 3 tablets (7.5 mg total) by mouth once a week. Caution:Chemotherapy. Protect from light. Salley Scarlet, MD  Active   Multiple Vitamins-Minerals (CENTRUM ADULTS PO) [id number]  Take by mouth. [provider]  Active   naproxen (NAPROSYN) 500 MG tablet [id number]  TAKE 1 TABLET TWICE DAILY WITH MEALS [place], Velna Hatchet, MD  Active   omeprazole (PRILOSEC) 20 MG capsule [id number]  TAKE 1 CAPSULE EVERY DAY [place], Velna Hatchet, MD  Active   polyethylene glycol powder (GLYCOLAX/MIRALAX) 17 GM/SCOOP powder [id number]  MIX 1 CAPFUL (17GM) IN 8 OUNCES OF WATER AND DRINK EVERY DAY , Velna Hatchet, MD  Active   rosuvastatin (CRESTOR) 10 MG tablet [id number]  Take 1 tablet (10 mg total) by mouth at bedtime. Salley Scarlet, MD  Active   spironolactone (ALDACTONE) 25 MG tablet [id number]  TAKE 1 TABLET (25 MG TOTAL) BY MOUTH EVERY MORNING. [place], Modena Nunnery, MD  Active   tacrolimus (PROGRAF) 1 MG capsule [id number]  Take by mouth. [provider]  Active   triamcinolone cream  (KENALOG) 0.1 % [id number]  APPLY TOPICALLY TO VULVA 2 TIMES DAILY [place], Modena Nunnery, MD  Active   vitamin B-12 (CYANOCOBALAMIN) 1000 MCG tablet [id number]  Take 1 tablet (1,000 mcg total) by mouth daily. Alycia Rossetti, MD  Active             Patient Active Problem List   Diagnosis Date Noted   DDD (degenerative disc disease), lumbar 20/07/711   Lichen planus 19/75/8832   Varicose vein of leg 12/17/2017   Retinopathy due to secondary diabetes mellitus, without macular edema, with mild nonproliferative retinopathy ([place]) 09/17/2017   Allergic rhinitis 01/04/2016   Leg swelling 05/25/2015   Chest congestion 11/17/2014   Hoarse voice quality 07/22/2014   Osteoarthritis of right knee 12/31/2012   History of  Endometrial cancer ([place]) 08/25/2012   Neuralgia 02/26/2012   HTN, goal below 130/80 06/29/2011   Carotid artery bruit 05/15/2011   OTHER DYSPHAGIA 12/08/2010   CONSTIPATION, CHRONIC 12/01/2009   Diabetes mellitus type 2 in obese ([place]) 03/31/2009   Class 3 obesity 01/24/2007   Hyperlipidemia 09/25/2006   Anxiety state 09/25/2006   GLAUCOMA NOS 09/25/2006   GERD 09/25/2006   Chronic pain 09/25/2006    Immunization History  Administered Date(s) Administered   Fluad Quad(Tabron Dose 65+) 07/06/2020   Influenza Whole 10/16/2004, 08/20/2006, 08/22/2007, 08/11/2008, 07/20/2010, 06/28/2011   Influenza,inj,Quad PF,6+ Mos 07/22/2014, 08/27/2015, 09/11/2016, 08/14/2017, 06/20/2018, 06/04/2019   Influenza-Unspecified 09/15/2013   Moderna Sars-Covid-2 Vaccination 12/14/2019, 01/17/2020, 09/30/2020   Pneumococcal Polysaccharide-23 03/21/2010, 07/06/2020   Td 03/21/2010   Tdap 03/21/2010   Zoster Recombinat (Shingrix) 07/08/2019, 07/08/2019, 09/02/2019    Conditions to be addressed/monitored:  {USCCMDZASSESSMENTOPTIONS:23563}  There are no care plans that you recently modified to display for this patient.    Medication Assistance:  {MEDASSISTANCEINFO:25044}  Compliance/Adherence/Medication fill history: Care Gaps:   Star-Rating Drugs:   Patient's preferred pharmacy is:  [id number] - [place], [place] - [address] AT [place]. [place] [address] Phone: [phone] Fax: [phone]  [facility]  ([place], [address] [place], [address] [place], [place] [address] Phone: [phone] Fax: [facility] - [place], [place] [address] Phone: [phone] Fax: [phone]  Uses pill box? {Yes or If no, why not?:20788} Pt endorses ***% compliance  We discussed: {Pharmacy options:24294} Patient decided to: {US Pharmacy Plan:23885}  Care Plan and Follow Up Patient Decision:  {FOLLOWUP:24991}  Plan: {CM FOLLOW UP HTXH:74142}      Current Barriers:  {pharmacybarriers:24917}  Pharmacist Clinical Goal(s):  Patient will {PHARMACYGOALCHOICES:24921} through collaboration with PharmD and provider.   Interventions: 1:1 collaboration with No primary care provider on file. regarding development and update of comprehensive plan of care as evidenced by provider attestation and co-signature Inter-disciplinary care team collaboration (see longitudinal plan of care) Comprehensive medication review performed; medication list updated in electronic medical record  {CCM PHARMD DISEASE STATES:25130}  Patient Goals/Self-Care Activities Patient will:  - {pharmacypatientgoals:24919}  Follow Up Plan: {CM FOLLOW UP LTRV:20233}

## 2021-04-04 ENCOUNTER — Other Ambulatory Visit: Payer: Self-pay

## 2021-04-04 MED ORDER — FLUTICASONE PROPIONATE 50 MCG/ACT NA SUSP: NASAL | 3 refills | Status: DC

## 2021-04-26 ENCOUNTER — Other Ambulatory Visit: Payer: Self-pay

## 2021-04-26 ENCOUNTER — Encounter: Payer: Self-pay | Admitting: Internal Medicine

## 2021-04-26 ENCOUNTER — Ambulatory Visit (INDEPENDENT_AMBULATORY_CARE_PROVIDER_SITE_OTHER): Payer: Medicare Other | Admitting: Internal Medicine

## 2021-04-26 VITALS — BP 132/82 | HR 85 | Temp 98.6°F | Resp 18 | Wt 236.6 lb

## 2021-04-26 DIAGNOSIS — E118 Type 2 diabetes mellitus with unspecified complications: Secondary | ICD-10-CM

## 2021-04-26 DIAGNOSIS — M1711 Unilateral primary osteoarthritis, right knee: Secondary | ICD-10-CM

## 2021-04-26 DIAGNOSIS — G894 Chronic pain syndrome: Secondary | ICD-10-CM

## 2021-04-26 DIAGNOSIS — F411 Generalized anxiety disorder: Secondary | ICD-10-CM | POA: Diagnosis not present

## 2021-04-26 DIAGNOSIS — E1169 Type 2 diabetes mellitus with other specified complication: Secondary | ICD-10-CM

## 2021-04-26 DIAGNOSIS — I10 Essential (primary) hypertension: Secondary | ICD-10-CM | POA: Diagnosis not present

## 2021-04-26 LAB — MICROALBUMIN / CREATININE URINE RATIO
Creatinine,U: 133.5 mg/dL
Microalb Creat Ratio: 0.5 mg/g (ref 0.0–30.0)
Microalb, Ur: <0.7 mg/dL (ref 0.0–1.9)

## 2021-04-26 MED ORDER — NAPROXEN 500 MG PO TABS: 500.0000 mg | ORAL_TABLET | Freq: Two times a day (BID) | ORAL | 3 refills | Status: DC

## 2021-04-26 MED ORDER — TRAMADOL HCL 50 MG PO TABS: 50.0000 mg | ORAL_TABLET | Freq: Every day | ORAL | 0 refills | Status: AC | PRN

## 2021-04-26 NOTE — Patient Instructions (Signed)
We will check the urine test today.  We will send in a prescription for tramadol to use for severe pain instead of hydrocodone as this is safer with the xanax.

## 2021-04-26 NOTE — Progress Notes (Signed)
   Subjective:   Patient ID: [name] [name], female    DOB: [birth year], 65 y.o.   MRN: [id number]  HPI The patient is a new 65 YO female coming in for obesity, anxiety and chronic pain from OA. Uses the hydrocodone as needed, cannot describe well how often that is, last night was last dose. Takes xanax nightly for sleep only. Asked several times different ways if she uses this for anxiety as well and she states for sleep taken many years.   PMH, [facility], social history reviewed and updated  Review of Systems  Constitutional: Negative.   HENT: Negative.    Eyes: Negative.   Respiratory:  Negative for cough, chest tightness and shortness of breath.   Cardiovascular:  Negative for chest pain, palpitations and leg swelling.  Gastrointestinal:  Negative for abdominal distention, abdominal pain, constipation, diarrhea, nausea and vomiting.  Musculoskeletal:  Positive for arthralgias.  Skin: Negative.   Neurological: Negative.   Psychiatric/Behavioral:  Positive for sleep disturbance.    Objective:  Physical Exam Constitutional:      Appearance: She is well-developed. She is obese.  HENT:     Head: Normocephalic and atraumatic.  Cardiovascular:     Rate and Rhythm: Normal rate and regular rhythm.  Pulmonary:     Effort: Pulmonary effort is normal. No respiratory distress.     Breath sounds: Normal breath sounds. No wheezing or rales.  Abdominal:     General: Bowel sounds are normal. There is no distension.     Palpations: Abdomen is soft.     Tenderness: There is no abdominal tenderness. There is no rebound.  Musculoskeletal:        General: Tenderness present.     Cervical back: Normal range of motion.  Skin:    General: Skin is warm and dry.  Neurological:     Mental Status: She is alert and oriented to person, place, and time.     Coordination: Coordination normal.    Vitals:   04/26/21 1505  BP: 132/82  Pulse: 85  Resp: 18  Temp: 98.6 F (37 C)  TempSrc: Oral  SpO2: 98%   Weight: 236 lb 9.6 oz (107.3 kg)    This visit occurred during the SARS-CoV-2 public health emergency.  Safety protocols were in place, including screening questions prior to the visit, additional usage of staff PPE, and extensive cleaning of exam room while observing appropriate contact time as indicated for disinfecting solutions.   Assessment & Plan:

## 2021-04-28 ENCOUNTER — Telehealth: Payer: Self-pay | Admitting: Internal Medicine

## 2021-04-28 NOTE — Telephone Encounter (Signed)
See below

## 2021-04-28 NOTE — Telephone Encounter (Signed)
  Patient is requesting that  naproxen (NAPROSYN) 500 MG tablet Be sent to [facility]  ([place], [place]   Patient is requesting a refill  ALPRAZolam (XANAX) 1 MG tablet [id number] - [place], [place] - [place] AT [place] [address]. [address]

## 2021-04-29 MED ORDER — NAPROXEN 500 MG PO TABS: 500.0000 mg | ORAL_TABLET | Freq: Two times a day (BID) | ORAL | 3 refills | Status: DC

## 2021-04-29 MED ORDER — ALPRAZOLAM 1 MG PO TABS: 1.0000 mg | ORAL_TABLET | Freq: Every evening | ORAL | 1 refills | Status: DC | PRN

## 2021-04-29 NOTE — Assessment & Plan Note (Signed)
Checking UDS. Does not ask for refill of xanax today but states takes for sleep only. [place] narcotic database reviewed and appears to fill #45 less often than monthly which seems consistent. We talked about risk of memory change, fall risk from long term benzodiazepine usage and she feels that this helps with her QOL enough to continue and has tried other sleep medicines over the years which did not help cannot recall names.

## 2021-04-29 NOTE — Assessment & Plan Note (Signed)
[place] narcotic database reviewed, UDS done. I have advised her that hydrocodone concurrent with xanax is not indicated and discussed the risk of accidental overdose, sedation, falls, memory changes. She is okay with trying tramadol instead.

## 2021-04-29 NOTE — Assessment & Plan Note (Signed)
[place] narcotic database reviewed. UDS ordered. Advised that I do not prescribe hydrocodone concurrent with xanax. Since she is getting a lot of relief from xanax for sleep she would like to try change to this. We will prescribe short term tramadol to see if this relieves pain also. She is unable to describe how often she takes hydrocodone but admits to last dose night before visit.

## 2021-04-29 NOTE — Assessment & Plan Note (Signed)
Taking losartan 50 mg daily and amlodipine 10 mg daily and lasix 20 mg daily prn fluid and spironolactone 25 mg daily. BP at goal no change today.

## 2021-04-29 NOTE — Assessment & Plan Note (Signed)
Checking microalbumin to creatinine ratio. Foot exam done. Taking metformin and is on statin and ARB. Last HgA1c at goal.

## 2021-04-29 NOTE — Assessment & Plan Note (Signed)
Counseled about weight loss through diet and exercise. She is on metformin which can help with weight loss.

## 2021-05-01 LAB — DRUG MONITORING, PANEL 8 WITH CONFIRMATION, URINE
6 Acetylmorphine: NEGATIVE ng/mL (ref <10)
Alcohol Metabolites: NEGATIVE ng/mL (ref <500)
Alphahydroxyalprazolam: 133 ng/mL — ABNORMAL HIGH (ref <25)
Alphahydroxymidazolam: NEGATIVE ng/mL (ref <50)
Alphahydroxytriazolam: NEGATIVE ng/mL (ref <50)
Aminoclonazepam: NEGATIVE ng/mL (ref <25)
Amphetamines: NEGATIVE ng/mL (ref <500)
Benzodiazepines: POSITIVE ng/mL — AB (ref <100)
Buprenorphine, Urine: NEGATIVE ng/mL (ref <5)
Cocaine Metabolite: NEGATIVE ng/mL (ref <150)
Codeine: NEGATIVE ng/mL (ref <50)
Creatinine: 132.8 mg/dL (ref >=20.0)
Hydrocodone: 257 ng/mL — ABNORMAL HIGH (ref <50)
Hydromorphone: 155 ng/mL — ABNORMAL HIGH (ref <50)
Hydroxyethylflurazepam: NEGATIVE ng/mL (ref <50)
Lorazepam: NEGATIVE ng/mL (ref <50)
MDMA: NEGATIVE ng/mL (ref <500)
Marijuana Metabolite: NEGATIVE ng/mL (ref <20)
Morphine: NEGATIVE ng/mL (ref <50)
Nordiazepam: NEGATIVE ng/mL (ref <50)
Norhydrocodone: 242 ng/mL — ABNORMAL HIGH (ref <50)
Opiates: POSITIVE ng/mL — AB (ref <100)
Oxazepam: NEGATIVE ng/mL (ref <50)
Oxidant: NEGATIVE ug/mL (ref <200)
Oxycodone: NEGATIVE ng/mL (ref <100)
Temazepam: NEGATIVE ng/mL (ref <50)
pH: 5.8 (ref 4.5–9.0)

## 2021-05-01 LAB — DM TEMPLATE

## 2021-05-16 ENCOUNTER — Telehealth: Payer: Self-pay | Admitting: Internal Medicine

## 2021-05-16 NOTE — Telephone Encounter (Signed)
Can do virtual visit tomorrow for treatment. Can take otc in the meantime.

## 2021-05-16 NOTE — Telephone Encounter (Signed)
Patient calling to report she tested covid+ on 7/30  Patient seeking advice  for cough, congestion

## 2021-05-16 NOTE — Telephone Encounter (Signed)
See below

## 2021-05-16 NOTE — Telephone Encounter (Signed)
   Patient requesting refill for albuterol (VENTOLIN HFA) 108 (90 Base) MCG/ACT inhaler  Pharmacy [id number] - , [place] [address]

## 2021-05-17 ENCOUNTER — Telehealth (INDEPENDENT_AMBULATORY_CARE_PROVIDER_SITE_OTHER): Payer: Medicare Other | Admitting: Internal Medicine

## 2021-05-17 ENCOUNTER — Encounter: Payer: Self-pay | Admitting: Internal Medicine

## 2021-05-17 DIAGNOSIS — U071 COVID-19: Secondary | ICD-10-CM | POA: Diagnosis not present

## 2021-05-17 MED ORDER — BENZONATATE 200 MG PO CAPS: 200.0000 mg | ORAL_CAPSULE | Freq: Three times a day (TID) | ORAL | 0 refills | Status: DC | PRN

## 2021-05-17 MED ORDER — PROMETHAZINE-DM 6.25-15 MG/5ML PO SYRP: 5.0000 mL | ORAL_SOLUTION | Freq: Four times a day (QID) | ORAL | 0 refills | Status: DC | PRN

## 2021-05-17 MED ORDER — MOLNUPIRAVIR EUA 200MG CAPSULE: 4.0000 | ORAL_CAPSULE | Freq: Two times a day (BID) | ORAL | 0 refills | Status: AC

## 2021-05-17 MED ORDER — ALBUTEROL SULFATE HFA 108 (90 BASE) MCG/ACT IN AERS: 2.0000 | INHALATION_SPRAY | RESPIRATORY_TRACT | 0 refills | Status: DC | PRN

## 2021-05-17 MED ORDER — DOXYCYCLINE HYCLATE 100 MG PO TABS: 100.0000 mg | ORAL_TABLET | Freq: Two times a day (BID) | ORAL | 0 refills | Status: DC

## 2021-05-17 NOTE — Telephone Encounter (Signed)
Patient has a virtual appointment with Sharlet Salina 05/17/2021 at 4 pm

## 2021-05-17 NOTE — Addendum Note (Signed)
Addended by: Pricilla Holm A on: 05/17/2021 04:34 PM   Modules accepted: Orders, Level of Service

## 2021-05-17 NOTE — Assessment & Plan Note (Signed)
Rx molnupiravir as [place] risk and in window for treatment. Rx albuterol inhaler and tessalon perles and promethazine/dm. Rx doxycycline if no improvement in 2-3 days can take this.

## 2021-05-17 NOTE — Telephone Encounter (Signed)
Ok to refill? Please advise.  

## 2021-05-17 NOTE — Progress Notes (Addendum)
Virtual Visit via Video Note  I connected with [name] [name] on 05/17/21 at  4:00 PM EDT by a video enabled telemedicine application and verified that I am speaking with the correct person using two identifiers.  The patient and the provider were at separate locations throughout the entire encounter. Patient location: home, Provider location: work   I discussed the limitations of evaluation and management by telemedicine and the availability of in person appointments. The patient expressed understanding and agreed to proceed. The patient and the provider were the only parties present for the visit unless noted in HPI below.  History of Present Illness: The patient is a 65 y.o. female with visit for cough and congestion and covid-19. Started 3-4 days ago and is worsening. Has cough and congestion and fatigue. Denies SOB or fevers or headache. Overall it is worsening. Has tried otc but needs refill on albuterol  Observations/Objective: Appearance: appears sick, breathing appears normal, some coughing during visit, casual grooming, abdomen does not appear distended, mental status is A and O times 3  Assessment and Plan: See problem oriented charting  Follow Up Instructions: rx molnupiravir and refill albuterol. Rx doxycycline to start taking in 3 days if not improving, rx tessalon perles and promethazine/dm  I discussed the assessment and treatment plan with the patient. The patient was provided an opportunity to ask questions and all were answered. The patient agreed with the plan and demonstrated an understanding of the instructions.   The patient was advised to call back or seek an in-person evaluation if the symptoms worsen or if the condition fails to improve as anticipated.  Hoyt Koch, MD

## 2021-05-18 NOTE — Telephone Encounter (Signed)
Addressed via virtual visit

## 2021-05-19 ENCOUNTER — Other Ambulatory Visit: Payer: Self-pay | Admitting: Internal Medicine

## 2021-05-19 DIAGNOSIS — E669 Obesity, unspecified: Secondary | ICD-10-CM

## 2021-05-19 DIAGNOSIS — E1169 Type 2 diabetes mellitus with other specified complication: Secondary | ICD-10-CM

## 2021-05-19 NOTE — Telephone Encounter (Signed)
Refill request for Tramadol 

## 2021-05-20 MED ORDER — ACCU-CHEK SMARTVIEW VI STRP: ORAL_STRIP | 11 refills | Status: DC

## 2021-05-20 NOTE — Telephone Encounter (Signed)
Can you convert this to a med refill that is easier to read? I have no idea what is needed here

## 2021-05-20 NOTE — Telephone Encounter (Signed)
See below

## 2021-05-20 NOTE — Telephone Encounter (Signed)
Patient is requesting a refill on test strips.

## 2021-05-20 NOTE — Telephone Encounter (Signed)
Refill request for ACCU-CHEK Guide test strips 100s

## 2021-06-10 ENCOUNTER — Telehealth: Payer: Self-pay

## 2021-06-10 NOTE — Telephone Encounter (Signed)
See below

## 2021-06-10 NOTE — Telephone Encounter (Signed)
Called patient. LVM asking her to give our office a call back to discuss her Tramadol. Office number was provided.   If patient calls back please ask how she is tolerating the medication and how often she is taking it.

## 2021-06-10 NOTE — Telephone Encounter (Signed)
Can you call and see how she is doing with this? It was a new medication at recent visit and she was only given #5 back in July. How often is she taking this?

## 2021-06-15 NOTE — Telephone Encounter (Signed)
Fyi.

## 2021-06-15 NOTE — Telephone Encounter (Signed)
Patient called in to notify provider that she is doing well on Tramadol & that she is only taking medication as needed   Callback # [phone]

## 2021-06-16 NOTE — Telephone Encounter (Signed)
Called patient. LVM asking the patient how often is she taking the medication and if she needs a refill. Office number was provided. Mychart message was sent as well.

## 2021-06-16 NOTE — Telephone Encounter (Signed)
We need to know how often she is taking the tramadol not just as needed (I.e. once a week or once a month) also is she requesting refill?

## 2021-06-27 ENCOUNTER — Ambulatory Visit: Payer: Medicare Other | Admitting: Internal Medicine

## 2021-06-28 ENCOUNTER — Encounter: Payer: Self-pay | Admitting: Internal Medicine

## 2021-06-28 ENCOUNTER — Ambulatory Visit (INDEPENDENT_AMBULATORY_CARE_PROVIDER_SITE_OTHER): Payer: Medicare Other | Admitting: Internal Medicine

## 2021-06-28 ENCOUNTER — Other Ambulatory Visit: Payer: Self-pay

## 2021-06-28 VITALS — BP 126/84 | HR 60 | Temp 98.4°F | Resp 18 | Ht 62.0 in | Wt 232.6 lb

## 2021-06-28 DIAGNOSIS — M1711 Unilateral primary osteoarthritis, right knee: Secondary | ICD-10-CM

## 2021-06-28 DIAGNOSIS — Z23 Encounter for immunization: Secondary | ICD-10-CM | POA: Diagnosis not present

## 2021-06-28 MED ORDER — TRAMADOL HCL 50 MG PO TABS: 50.0000 mg | ORAL_TABLET | Freq: Every day | ORAL | 1 refills | Status: DC | PRN

## 2021-06-28 MED ORDER — ALPRAZOLAM 1 MG PO TABS: 1.0000 mg | ORAL_TABLET | Freq: Every evening | ORAL | 5 refills | Status: DC | PRN

## 2021-06-28 NOTE — Patient Instructions (Signed)
We have sent in the refill of the tramadol to take as needed for pain.

## 2021-06-28 NOTE — Progress Notes (Signed)
   Subjective:   Patient ID: [name] [name], female    DOB: [birth year], 65 y.o.   MRN: [id number]  HPI The patient is a 65 YO female coming in for follow up pain. We did elect to transition to tramadol as her as needed pain medication. She was given initial 5 day prescription and advised to let us know if this was working or how often she was taking. She did not call for refill or let us know. She did run out some time ago and would like refill. It did work well and she takes only as needed. Could not clarify further.   Review of Systems  Constitutional: Negative.   HENT: Negative.    Eyes: Negative.   Respiratory:  Negative for cough, chest tightness and shortness of breath.   Cardiovascular:  Negative for chest pain, palpitations and leg swelling.  Gastrointestinal:  Negative for abdominal distention, abdominal pain, constipation, diarrhea, nausea and vomiting.  Musculoskeletal:  Positive for arthralgias.  Skin: Negative.   Neurological: Negative.   Psychiatric/Behavioral:  Positive for sleep disturbance.    Objective:  Physical Exam Constitutional:      Appearance: She is well-developed. She is obese.  HENT:     Head: Normocephalic and atraumatic.  Cardiovascular:     Rate and Rhythm: Normal rate and regular rhythm.  Pulmonary:     Effort: Pulmonary effort is normal. No respiratory distress.     Breath sounds: Normal breath sounds. No wheezing or rales.  Abdominal:     General: Bowel sounds are normal. There is no distension.     Palpations: Abdomen is soft.     Tenderness: There is no abdominal tenderness. There is no rebound.  Musculoskeletal:     Cervical back: Normal range of motion.  Skin:    General: Skin is warm and dry.  Neurological:     Mental Status: She is alert and oriented to person, place, and time.     Coordination: Coordination normal.    Vitals:   06/28/21 1435  BP: 126/84  Pulse: 60  Resp: 18  Temp: 98.4 F (36.9 C)  TempSrc: Oral  SpO2: 99%   Weight: 232 lb 9.6 oz (105.5 kg)  Height: '5\' 2"'$  (1.575 m)    This visit occurred during the SARS-CoV-2 public health emergency.  Safety protocols were in place, including screening questions prior to the visit, additional usage of staff PPE, and extensive cleaning of exam room while observing appropriate contact time as indicated for disinfecting solutions.   Assessment & Plan:  Flu shot given at visit.

## 2021-06-28 NOTE — Assessment & Plan Note (Signed)
She did well with relief from tramadol which she used prn. [place] narcotic database reviewed and appropriate and UDS obtained at last visit normal. Given that she cannot clarify how often she used this will do rx for #15 with 1 refill and see how often she is using. Follow up 3-4 months.

## 2021-07-05 ENCOUNTER — Ambulatory Visit (INDEPENDENT_AMBULATORY_CARE_PROVIDER_SITE_OTHER): Payer: Medicare Other | Admitting: *Deleted

## 2021-07-05 DIAGNOSIS — Z0000 Encounter for general adult medical examination without abnormal findings: Secondary | ICD-10-CM | POA: Diagnosis not present

## 2021-07-05 NOTE — Patient Instructions (Signed)
Health Maintenance, Female Adopting a healthy lifestyle and getting preventive care are important in promoting health and wellness. Ask your health care provider about: The right schedule for you to have regular tests and exams. Things you can do on your own to prevent diseases and keep yourself healthy. What should I know about diet, weight, and exercise? Eat a healthy diet  Eat a diet that includes plenty of vegetables, fruits, low-fat dairy products, and lean protein. Do not eat a lot of foods that are Moroney in solid fats, added sugars, or sodium. Maintain a healthy weight Body mass index (BMI) is used to identify weight problems. It estimates body fat based on height and weight. Your health care provider can help determine your BMI and help you achieve or maintain a healthy weight. Get regular exercise Get regular exercise. This is one of the most important things you can do for your health. Most adults should: Exercise for at least 150 minutes each week. The exercise should increase your heart rate and make you sweat (moderate-intensity exercise). Do strengthening exercises at least twice a week. This is in addition to the moderate-intensity exercise. Spend less time sitting. Even light physical activity can be beneficial. Watch cholesterol and blood lipids Have your blood tested for lipids and cholesterol at 66 years of age, then have this test every 5 years. Have your cholesterol levels checked more often if: Your lipid or cholesterol levels are Backs. You are older than 66 years of age. You are at Tubby risk for heart disease. What should I know about cancer screening? Depending on your health history and family history, you may need to have cancer screening at various ages. This may include screening for: Breast cancer. Cervical cancer. Colorectal cancer. Skin cancer. Lung cancer. What should I know about heart disease, diabetes, and Stangelo blood pressure? Blood pressure and heart  disease Whinery blood pressure causes heart disease and increases the risk of stroke. This is more likely to develop in people who have Forgy blood pressure readings, are of African descent, or are overweight. Have your blood pressure checked: Every 3-5 years if you are 18-39 years of age. Every year if you are 40 years old or older. Diabetes Have regular diabetes screenings. This checks your fasting blood sugar level. Have the screening done: Once every three years after age 40 if you are at a normal weight and have a low risk for diabetes. More often and at a younger age if you are overweight or have a Swallow risk for diabetes. What should I know about preventing infection? Hepatitis B If you have a higher risk for hepatitis B, you should be screened for this virus. Talk with your health care provider to find out if you are at risk for hepatitis B infection. Hepatitis C Testing is recommended for: Everyone born from 1945 through 1965. Anyone with known risk factors for hepatitis C. Sexually transmitted infections (STIs) Get screened for STIs, including gonorrhea and chlamydia, if: You are sexually active and are younger than 66 years of age. You are older than 66 years of age and your health care provider tells you that you are at risk for this type of infection. Your sexual activity has changed since you were last screened, and you are at increased risk for chlamydia or gonorrhea. Ask your health care provider if you are at risk. Ask your health care provider about whether you are at Fielden risk for HIV. Your health care provider may recommend a prescription medicine   to help prevent HIV infection. If you choose to take medicine to prevent HIV, you should first get tested for HIV. You should then be tested every 3 months for as long as you are taking the medicine. Pregnancy If you are about to stop having your period (premenopausal) and you may become pregnant, seek counseling before you get  pregnant. Take 400 to 800 micrograms (mcg) of folic acid every day if you become pregnant. Ask for birth control (contraception) if you want to prevent pregnancy. Osteoporosis and menopause Osteoporosis is a disease in which the bones lose minerals and strength with aging. This can result in bone fractures. If you are 65 years old or older, or if you are at risk for osteoporosis and fractures, ask your health care provider if you should: Be screened for bone loss. Take a calcium or vitamin D supplement to lower your risk of fractures. Be given hormone replacement therapy (HRT) to treat symptoms of menopause. Follow these instructions at home: Lifestyle Do not use any products that contain nicotine or tobacco, such as cigarettes, e-cigarettes, and chewing tobacco. If you need help quitting, ask your health care provider. Do not use street drugs. Do not share needles. Ask your health care provider for help if you need support or information about quitting drugs. Alcohol use Do not drink alcohol if: Your health care provider tells you not to drink. You are pregnant, may be pregnant, or are planning to become pregnant. If you drink alcohol: Limit how much you use to 0-1 drink a day. Limit intake if you are breastfeeding. Be aware of how much alcohol is in your drink. In the [place] one drink equals one 12 oz bottle of beer (355 mL), one 5 oz glass of wine (148 mL), or one 1 oz glass of hard liquor (44 mL). General instructions Schedule regular health, dental, and eye exams. Stay current with your vaccines. Tell your health care provider if: You often feel depressed. You have ever been abused or do not feel safe at home. Summary Adopting a healthy lifestyle and getting preventive care are important in promoting health and wellness. Follow your health care provider's instructions about healthy diet, exercising, and getting tested or screened for diseases. Follow your health care provider's  instructions on monitoring your cholesterol and blood pressure. This information is not intended to replace advice given to you by your health care provider. Make sure you discuss any questions you have with your health care provider. Document Revised: 12/10/2020 Document Reviewed: 09/25/2018 [employer] Patient Education  2022 [employer]  

## 2021-07-05 NOTE — Progress Notes (Addendum)
Subjective:   [name] [name] is a 65 y.o. female who presents for Medicare Annual (Subsequent) preventive examination.  I connected with  [name] [name] on 07/05/21 by audio enabled telemedicine application and verified that I am speaking with the correct person using two identifiers.   I discussed the limitations of evaluation and management by telemedicine. The patient expressed understanding and agreed to proceed.   Location of Patient: Home Location of Provider: Office Persons participating in visit: [name] [name] (patient) & Silvestre Moment, CMA  Review of Systems    Defer to PCP Cardiac Risk Factors include: none     Objective:    There were no vitals filed for this visit. There is no height or weight on file to calculate BMI.  Advanced Directives 07/05/2021 03/08/2020 12/10/2018 09/30/2018 05/08/2016 06/11/2012 01/22/2012  Does Patient Have a Medical Advance Directive? No No No No No Patient does not have advance directive Patient does not have advance directive;Patient would not like information  Would patient like information on creating a medical advance directive? Yes (ED - Information included in AVS) Yes (MAU/Ambulatory/Procedural Areas - Information given) No - Patient declined No - Patient declined No - patient declined information - -  Pre-existing out of facility DNR order (yellow form or pink MOST form) - - - - - - No    Current Medications (verified) Outpatient Encounter Medications as of 07/05/2021  Medication Sig   acitretin (SORIATANE) 25 MG capsule Take 25 mg by mouth daily.    albuterol (VENTOLIN HFA) 108 (90 Base) MCG/ACT inhaler Inhale 2 puffs into the lungs every 4 (four) hours as needed for wheezing or shortness of breath.   Alcohol Swabs (B-D SINGLE USE SWABS REGULAR) PADS 200 packets by Other route 2 (two) times daily.   ALPRAZolam (XANAX) 1 MG tablet Take 1 tablet (1 mg total) by mouth at bedtime as needed for anxiety.   amLODipine (NORVASC) 10 MG tablet TAKE 1 TABLET  EVERY MORNING   aspirin 325 MG tablet Take 325 mg by mouth every morning.   benzonatate (TESSALON) 200 MG capsule Take 1 capsule (200 mg total) by mouth 3 (three) times daily as needed.   Blood Glucose Calibration (ACCU-CHEK SMARTVIEW CONTROL) LIQD USE AS DIRECTED   Blood Glucose Monitoring Suppl (ACCU-CHEK NANO SMARTVIEW) w/Device KIT Use as directed to monitor FSBS 2x daily. Dx: E11.69   clobetasol ointment (TEMOVATE) 0.05 % APPLY IN SORE AREAS OF MOUTH BID. NOT ON FACE/GROIN   co-enzyme Q-10 30 MG capsule Take 1 capsule (30 mg total) by mouth 3 (three) times daily.   famotidine (PEPCID) 40 MG tablet Take 20 mg by mouth daily.   fluocinonide ointment (LIDEX) 0.05 % Apply to affected areas of body twice daily. Never on face/groin   fluticasone (FLONASE) 50 MCG/ACT nasal spray USE 2 SPRAYS IN BOTH NOSTRILS DAILY.   folic acid (FOLVITE) 1 MG tablet Take 1 mg by mouth daily.   furosemide (LASIX) 20 MG tablet Take 1 tablet (20 mg total) by mouth daily as needed.   glucose blood (ACCU-CHEK SMARTVIEW) test strip Use as directed to monitor FSBS daily. Dx: E11.69   Lancet Devices (ADJUSTABLE LANCING DEVICE) MISC TEST FASTING BLOOD SUGAR TWICE PER DAY DX; E11.9   Lancets MISC Use as directed to monitor FSBS 2x daily. Dx: E11.9.   lidocaine (XYLOCAINE) 2 % solution Use as directed 5-10 mLs in the mouth or throat 2 (two) times daily.   LINZESS 145 MCG CAPS capsule TAKE 1 CAPSULE (145 MCG  TOTAL) BY MOUTH DAILY BEFORE BREAKFAST.   losartan (COZAAR) 50 MG tablet Take 1 tablet (50 mg total) by mouth daily.   meclizine (ANTIVERT) 12.5 MG tablet Take 1 tablet (12.5 mg total) by mouth 3 (three) times daily as needed for dizziness.   metFORMIN (GLUCOPHAGE) 500 MG tablet Take 1 tablet (500 mg total) by mouth 2 (two) times daily with a meal.   methocarbamol (ROBAXIN) 500 MG tablet Take 1 tablet (500 mg total) by mouth every 6 (six) hours as needed for muscle spasms.   methotrexate (RHEUMATREX) 2.5 MG tablet Take 3  tablets (7.5 mg total) by mouth once a week. Caution:Chemotherapy. Protect from light.   Multiple Vitamins-Minerals (CENTRUM ADULTS PO) Take by mouth.   naproxen (NAPROSYN) 500 MG tablet Take 1 tablet (500 mg total) by mouth 2 (two) times daily with a meal.   omeprazole (PRILOSEC) 20 MG capsule TAKE 1 CAPSULE EVERY DAY   polyethylene glycol powder (GLYCOLAX/MIRALAX) 17 GM/SCOOP powder MIX 1 CAPFUL (17GM) IN 8 OUNCES OF WATER AND DRINK EVERY DAY   rosuvastatin (CRESTOR) 10 MG tablet Take 1 tablet (10 mg total) by mouth at bedtime.   spironolactone (ALDACTONE) 25 MG tablet TAKE 1 TABLET (25 MG TOTAL) BY MOUTH EVERY MORNING.   tacrolimus (PROGRAF) 1 MG capsule Take by mouth.   traMADol (ULTRAM) 50 MG tablet Take 1 tablet (50 mg total) by mouth daily as needed.   triamcinolone cream (KENALOG) 0.1 % APPLY TOPICALLY TO VULVA 2 TIMES DAILY   vitamin B-12 (CYANOCOBALAMIN) 1000 MCG tablet Take 1 tablet (1,000 mcg total) by mouth daily.   No facility-administered encounter medications on file as of 07/05/2021.    Allergies (verified) Contrast media [iodinated diagnostic agents], Lisinopril, and Buspar [buspirone hcl]   History: Past Medical History:  Diagnosis Date   Anxiety    Cancer ([place])    Chronic abdominal pain    Constipation    Depression    Diabetes mellitus, type 2 ([place])    Essential hypertension, benign    GERD (gastroesophageal reflux disease)    Glaucoma    Last eye exam 9/08   Hiatal hernia    HSV-2 seropositive    Iron deficiency anemia    Low back pain    Mixed hyperlipidemia    Osteoarthritis    Positive H. pylori test    Schatzki's ring    TIA (transient ischemic attack)    6/10   Varicose veins    Past Surgical History:  Procedure Laterality Date   ABDOMINAL HYSTERECTOMY  08/01/2012   total and BSO due uterine cancer   CHOLECYSTECTOMY  Dec 26, 2010   COLONOSCOPY  01/2006   DILATION AND CURETTAGE OF UTERUS  06/12/12   ESOPHAGOGASTRODUODENOSCOPY     Esophageal  dilation 2007   Lumbar discetomy  10/01   Dr. Larose Hires    SPINE SURGERY     TUBAL LIGATION  1941   UMBILICAL HERNIA REPAIR  12/26/2010   Family History  Problem Relation Age of Onset   Hypertension Father    Diabetes Father    Diabetes Mother    Healthy Sister    Heart disease Daughter    Healthy Daughter    Arrhythmia Daughter        Defibrillator    Scoliosis Daughter    Social History   Socioeconomic History   Marital status: Widowed    Spouse name: Not on file   Number of children: Not on file   Years of education: Not on  file   Highest education level: Not on file  Occupational History   Not on file  Tobacco Use   Smoking status: Never   Smokeless tobacco: Never  Vaping Use   Vaping Use: Never used  Substance and Sexual Activity   Alcohol use: No   Drug use: No   Sexual activity: Not Currently  Other Topics Concern   Not on file  Social History Narrative   Lives with children   Social Determinants of Health   Financial Resource Strain: Low Risk    Difficulty of Paying Living Expenses: Not hard at all  Food Insecurity: No Food Insecurity   Worried About Charity fundraiser in the Last Year: Never true   [place] in the Last Year: Never true  Transportation Needs: No Transportation Needs   Lack of Transportation (Medical): No   Lack of Transportation (Non-Medical): No  Physical Activity: Insufficiently Active   Days of Exercise per Week: 3 days   Minutes of Exercise per Session: 30 min  Stress: No Stress Concern Present   Feeling of Stress : Not at all  Social Connections: Moderately Isolated   Frequency of Communication with Friends and Family: More than three times a week   Frequency of Social Gatherings with Friends and Family: Twice a week   Attends Religious Services: More than 4 times per year   Active Member of [employer] or Organizations: No   Attends Archivist Meetings: Never   Marital Status: Widowed    Tobacco  Counseling Counseling given: Not Answered   Clinical Intake:     Pain : No/denies pain     BMI - recorded: 42.53 Nutritional Status: BMI > 30  Obese Nutritional Risks: Other (Comment) Diabetes: Yes CBG done?: No Did pt. bring in CBG monitor from home?: No  How often do you need to have someone help you when you read instructions, pamphlets, or other written materials from your doctor or pharmacy?: 1 - Never  Diabetic? Yes  Interpreter Needed?: No      Activities of Daily Living In your present state of health, do you have any difficulty performing the following activities: 07/05/2021 04/26/2021  Hearing? Y N  Vision? N N  Difficulty concentrating or making decisions? N N  Walking or climbing stairs? N N  Dressing or bathing? N N  Doing errands, shopping? N N  Preparing Food and eating ? N -  Using the Toilet? N -  In the past six months, have you accidently leaked urine? N -  Do you have problems with loss of bowel control? N -  Managing your Medications? N -  Managing your Finances? N -  Housekeeping or managing your Housekeeping? N -  Some recent data might be hidden    Patient Care Team: Hoyt Koch, MD as PCP - General (Internal Medicine) Josue Hector, MD (Cardiology) Edythe Clarity, [facility] as Pharmacist (Pharmacist)  Indicate any recent Medical Services you may have received from other than [facility] providers in the past year (date may be approximate).     Assessment:   This is a routine wellness examination for [name] [name].  Hearing/Vision screen No results found.  Dietary issues and exercise activities discussed: Current Exercise Habits: Home exercise routine, Type of exercise: walking, Time (Minutes): 30, Frequency (Times/Week): 3, Weekly Exercise (Minutes/Week): 90, Intensity: Mild, Exercise limited by: None identified   Goals Addressed   None    Depression Screen [facility] 2/9 Scores 07/05/2021 03/12/2021 11/10/2020  03/08/2020 07/08/2019 11/22/2018  09/20/2018  PHQ - 2 Score 0 0 0 0 0 0 0  PHQ- 9 Score - 5 - 2 - - -    Fall Risk Fall Risk  07/05/2021 03/10/2021 11/10/2020 03/08/2020 07/08/2019  Falls in the past year? 0 0 0 0 0  Number falls in past yr: 0 0 - - -  Comment - - - - -  Injury with Fall? 0 0 - - -  Risk for fall due to : - - No Fall Risks No Fall Risks -  Follow up - - Falls evaluation completed Falls evaluation completed Falls evaluation completed    FALL RISK PREVENTION PERTAINING TO THE HOME:  Any stairs in or around the home? Yes  If so, are there any without handrails? No  Home free of loose throw rugs in walkways, pet beds, electrical cords, etc? Yes Adequate lighting in your home to reduce risk of falls? Yes   ASSISTIVE DEVICES UTILIZED TO PREVENT FALLS:  Life alert? No  Use of a cane, walker or w/c? No  Grab bars in the bathroom? Yes  Shower chair or bench in shower? Yes  Elevated toilet seat or a handicapped toilet? Yes   TIMED UP AND GO:  Was the test performed?  n/a .  Length of time to ambulate 10 feet: n/a sec.     Cognitive Function:     6CIT Screen 07/05/2021  What Year? 0 points  What month? 0 points  What time? 0 points  Count back from 20 0 points  Months in reverse 2 points  Repeat phrase 10 points  Total Score 12    Immunizations Immunization History  Administered Date(s) Administered   Fluad Quad([place] Dose 65+) 07/06/2020, 06/28/2021   Influenza Whole 10/16/2004, 08/20/2006, 08/22/2007, 08/11/2008, 07/20/2010, 06/28/2011   Influenza,inj,Quad PF,6+ Mos 07/22/2014, 08/27/2015, 09/11/2016, 08/14/2017, 06/20/2018, 06/04/2019   Influenza-Unspecified 09/15/2013   Moderna Sars-Covid-2 Vaccination 12/14/2019, 01/17/2020, 09/30/2020   Pneumococcal Polysaccharide-23 03/21/2010, 07/06/2020   Td 03/21/2010   Tdap 03/21/2010   Zoster Recombinat (Shingrix) 07/08/2019, 07/08/2019, 09/02/2019    TDAP Status: due, information provided  Flu Vaccine status: Up to date  Pneumococcal  vaccine status: Up to date  Covid-19 vaccine status: Information provided on how to obtain vaccines.   Qualifies for Shingles Vaccine? Yes   Zostavax completed No   Shingrix Completed?: Yes  Screening Tests Health Maintenance  Topic Date Due   COVID-19 Vaccine (4 - Booster for Moderna series) 07/21/2021 (Originally 12/23/2020)   TETANUS/TDAP  07/05/2022 (Originally 03/21/2020)   HEMOGLOBIN A1C  09/10/2021   OPHTHALMOLOGY EXAM  11/11/2021   COLONOSCOPY (Pts 45-43yrs Insurance coverage will need to be confirmed)  04/09/2022   FOOT EXAM  04/26/2022   MAMMOGRAM  08/09/2022   INFLUENZA VACCINE  Completed   DEXA SCAN  Completed   Hepatitis C Screening  Completed   Zoster Vaccines- Shingrix  Completed   HPV VACCINES  Aged Out    Health Maintenance  There are no preventive care reminders to display for this patient.   Colorectal cancer screening: Type of screening: Colonoscopy. Completed 04/09/2012. Repeat every 10 years  Mammogram status: Completed 08/09/20. Repeat every year  Bone Density status: Completed 04/12/2020. Results reflect: Bone density results: NORMAL. Repeat every 2 years.  Lung Cancer Screening: (Low Dose CT Chest recommended if Age 45-80 years, 30 pack-year currently smoking OR have quit w/in 15years.) does not qualify.    Additional Screening:  Hepatitis C Screening: does qualify; Completed 09/12/2017  Vision Screening: Recommended annual ophthalmology exams for early detection of glaucoma and other disorders of the eye. Is the patient up to date with their annual eye exam?  Yes  Who is the provider or what is the name of the office in which the patient attends annual eye exams? [facility]; Dr. Madelin Headings If pt is not established with a provider, would they like to be referred to a provider to establish care? No .   Dental Screening: Recommended annual dental exams for proper oral hygiene  Community Resource Referral / Chronic Care Management: CRR  required this visit?  No   CCM required this visit?  No      Plan:     I have personally reviewed and noted the following in the patient's chart:   Medical and social history Use of alcohol, tobacco or illicit drugs  Current medications and supplements including opioid prescriptions.  Functional ability and status Nutritional status Physical activity Advanced directives List of other physicians Hospitalizations, surgeries, and ER visits in previous 12 months Vitals Screenings to include cognitive, depression, and falls Referrals and appointments  In addition, I have reviewed and discussed with patient certain preventive protocols, quality metrics, and best practice recommendations. A written personalized care plan for preventive services as well as general preventive health recommendations were provided to patient.     Cannon Kettle, [place]   07/05/2021   Nurse Notes: 13 minutes non face to face   [name] [name] , Thank you for taking time to come for your Medicare Wellness Visit. I appreciate your ongoing commitment to your health goals. Please review the following plan we discussed and let me know if I can assist you in the future.   These are the goals we discussed:  Goals      Manage My Medicine     Timeframe:  Short-Term Goal Priority:  Medium Start Date:     11/23/20                        Expected End Date:  03/23/21                     Follow Up Date 01/13/21   - use a pillbox to sort medicine  - Take Omeprazole 20mg  30-60 minutes before meal on an empty stomach for best efficacy   Why is this important?   These steps will help you keep on track with your medicines.   Notes:      Pharmacy Care Plan:     CARE PLAN ENTRY (see longitudinal plan of care for additional care plan information)  Current Barriers:  Chronic Disease Management support, education, and care coordination needs related to Hypertension, Hyperlipidemia, Diabetes, and GERD   Hypertension BP  Readings from Last 3 Encounters:  03/08/20 124/60  11/07/19 124/80  07/08/19 130/74  Pharmacist Clinical Goal(s): Over the next 180 days, patient will work with PharmD and providers to maintain BP goal <130/80 Current regimen:  Amlodipine 10mg  daily Losartan 50mg  daily Spironolactone 25mg  daily Interventions: Reviewed home monitoring of BP Discussed adverse effects of medication Patient self care activities - Over the next 180 days, patient will: Check BP daily, document, and provide at future appointments Ensure daily salt intake < 2300 mg/day  Hyperlipidemia Lab Results  Component Value Date/Time   LDLCALC 52 11/07/2019 02:21 PM  Pharmacist Clinical Goal(s): Over the next 180 days, patient will work with PharmD and providers to maintain LDL  goal < 70 Current regimen:  Rosuvastatin $RemoveBefor'10mg'OumtBszeAwuj$  daily Interventions: Reviewed most recent lipid panel Patient self care activities - Over the next 180 days, patient will: Continue to focus on medication adherence by pill count Continue to work on healthy diet low in fats and sugar  Diabetes Lab Results  Component Value Date/Time   HGBA1C 6.4 (H) 03/08/2020 03:52 PM   HGBA1C 6.6 (H) 11/07/2019 02:21 PM  Pharmacist Clinical Goal(s): Over the next 180 days, patient will work with PharmD and providers to maintain A1c goal <7% Current regimen:  Metformin $RemoveBe'500mg'bKGUuyQoU$  twice daily Interventions: Reviewed home blood sugar readings Discussed ways to treat any occurring hypoglycemia Discussed diabetic diet Patient self care activities - Over the next 180 days, patient will: Check blood sugar once daily, document, and provide at future appointments Contact provider with any episodes of hypoglycemia  GERD Pharmacist Clinical Goal(s) Over the next 180 days, patient will work with PharmD and providers to optimize medication and minimize symptoms related to GERD. Current regimen:  Omeprazole $RemoveBef'20mg'PNgLrIhzVi$  daily Famotidine $RemoveBeforeDE'40mg'mwEIQUJwdmVyxYL$   daily Interventions: Discussed frequency of acid reflux symptoms Counseled on long term effects of PPI. Recommend taper of PPI therapy. Patient self care activities - Over the next 180 days, patient will: Continue to take medication until told otherwise by provider.   Initial goal documentation         This is a list of the screening recommended for you and due dates:  Health Maintenance  Topic Date Due   COVID-19 Vaccine (4 - Booster for Moderna series) 07/21/2021*   Tetanus Vaccine  07/05/2022*   Hemoglobin A1C  09/10/2021   Eye exam for diabetics  11/11/2021   Colon Cancer Screening  04/09/2022   Complete foot exam   04/26/2022   Mammogram  08/09/2022   Flu Shot  Completed   DEXA scan (bone density measurement)  Completed   Hepatitis C Screening: USPSTF Recommendation to screen - Ages 18-79 yo.  Completed   Zoster (Shingles) Vaccine  Completed   HPV Vaccine  Aged Out  *Topic was postponed. The date shown is not the original due date.      Medical screening examination/treatment/procedure(s) were performed by non-physician practitioner and as supervising physician I was immediately available for consultation/collaboration.  I agree with above. Scarlette Calico, MD

## 2021-07-07 ENCOUNTER — Ambulatory Visit: Payer: Medicare Other

## 2021-07-15 ENCOUNTER — Other Ambulatory Visit: Payer: Self-pay | Admitting: Internal Medicine

## 2021-07-18 DIAGNOSIS — Z5181 Encounter for therapeutic drug level monitoring: Secondary | ICD-10-CM | POA: Diagnosis not present

## 2021-07-18 DIAGNOSIS — B370 Candidal stomatitis: Secondary | ICD-10-CM | POA: Diagnosis not present

## 2021-07-18 DIAGNOSIS — L438 Other lichen planus: Secondary | ICD-10-CM | POA: Diagnosis not present

## 2021-07-19 NOTE — Telephone Encounter (Signed)
Patient calling to check status of refill request for: Clotrimazole 10 MG & Doxycycline Hyclate 100 MG   Pharmacy: [place], [address] AT [address]  Phone:  [phone] Fax:  [phone]

## 2021-07-20 ENCOUNTER — Telehealth: Payer: Self-pay | Admitting: Nurse Practitioner

## 2021-07-20 NOTE — Telephone Encounter (Signed)
Pt called and would like to see if you would accept her back as your patient.  Pt would like to have a call back.

## 2021-07-20 NOTE — Telephone Encounter (Signed)
I am unable to accept her as a patient at this time.  Please give her a list of PCPs accepting new patients in her area if she has not established with anyone yet.

## 2021-08-03 ENCOUNTER — Other Ambulatory Visit: Payer: Self-pay | Admitting: Internal Medicine

## 2021-08-09 DIAGNOSIS — K219 Gastro-esophageal reflux disease without esophagitis: Secondary | ICD-10-CM | POA: Diagnosis not present

## 2021-08-09 DIAGNOSIS — R490 Dysphonia: Secondary | ICD-10-CM | POA: Diagnosis not present

## 2021-08-11 ENCOUNTER — Telehealth: Payer: Self-pay | Admitting: Internal Medicine

## 2021-08-11 NOTE — Telephone Encounter (Signed)
Patient says she received a vm from [facility] saying that they have reached out several times for a refill request but have not gotten a response  Patient says Order # [id number]  Asked patient what medications needed refilling but patient says she did not know  [facility]  ([place], [place]  Phone:  [phone] Fax:  [phone]

## 2021-08-12 NOTE — Telephone Encounter (Signed)
Spoke with the patient and she was unable to tell me the name of the medication. She stated that she would give our office a call back when she figures it out.

## 2021-08-16 ENCOUNTER — Ambulatory Visit
Admission: EM | Admit: 2021-08-16 | Discharge: 2021-08-16 | Disposition: A | Discharge status: Home / Self Care | Payer: Medicare Other

## 2021-08-16 MED ORDER — ROSUVASTATIN CALCIUM 10 MG PO TABS: 10.0000 mg | ORAL_TABLET | Freq: Every day | ORAL | 3 refills | Status: DC

## 2021-08-16 MED ORDER — LOSARTAN POTASSIUM 50 MG PO TABS: 50.0000 mg | ORAL_TABLET | Freq: Every day | ORAL | 3 refills | Status: DC

## 2021-08-16 MED ORDER — OMEPRAZOLE 20 MG PO CPDR: 20.0000 mg | DELAYED_RELEASE_CAPSULE | Freq: Every day | ORAL | 3 refills | Status: DC

## 2021-08-16 NOTE — Telephone Encounter (Signed)
See below

## 2021-08-16 NOTE — Telephone Encounter (Signed)
1.Medication Requested: losartan (COZAAR) 50 MG tablet rosuvastatin (CRESTOR) 10 MG tablet omeprazole (PRILOSEC) 20 MG capsule traMADol (ULTRAM) 50 MG tablet  2. Pharmacy (Name, Street, [place]): [facility]  ([place], [place] Phone:  [phone]  Fax:  [phone]      3. On Med List: y  63. Last Visit with PCP: 9.13.2022  5. Next visit date with PCP: 11.10.2022   Agent: Please be advised that RX refills may take up to 3 business days. We ask that you follow-up with your pharmacy.

## 2021-08-16 NOTE — Telephone Encounter (Signed)
Have sent in 3 non-controlled medications. We need clarifying information about how she is taking the tramadol. It appears she has upcoming visit on 08/25/21 we can discuss then. We previously prescribed tramadol and she did not know if this was helping or how she was taking it and never followed up with [place] and then was refilled at recent visit. Per database she is filling #15 about every month (filled on 06/28/21 and 08/01/21) so we will need to know this information before we would consider refill. Okay to move up visit if desired to discuss refill sooner.

## 2021-08-16 NOTE — Addendum Note (Signed)
Addended by: Pricilla Holm A on: 08/16/2021 02:29 PM   Modules accepted: Orders

## 2021-08-19 ENCOUNTER — Ambulatory Visit: Payer: Medicare Other | Admitting: Internal Medicine

## 2021-08-22 ENCOUNTER — Ambulatory Visit (INDEPENDENT_AMBULATORY_CARE_PROVIDER_SITE_OTHER): Payer: Medicare Other

## 2021-08-22 ENCOUNTER — Encounter: Payer: Self-pay | Admitting: Internal Medicine

## 2021-08-22 ENCOUNTER — Ambulatory Visit (INDEPENDENT_AMBULATORY_CARE_PROVIDER_SITE_OTHER): Payer: Medicare Other | Admitting: Internal Medicine

## 2021-08-22 ENCOUNTER — Other Ambulatory Visit: Payer: Self-pay

## 2021-08-22 VITALS — BP 126/88 | HR 78 | Temp 98.4°F | Resp 18 | Ht 60.0 in

## 2021-08-22 DIAGNOSIS — R0989 Other specified symptoms and signs involving the circulatory and respiratory systems: Secondary | ICD-10-CM

## 2021-08-22 DIAGNOSIS — S92422A Displaced fracture of distal phalanx of left great toe, initial encounter for closed fracture: Secondary | ICD-10-CM | POA: Diagnosis not present

## 2021-08-22 DIAGNOSIS — M79672 Pain in left foot: Secondary | ICD-10-CM | POA: Diagnosis not present

## 2021-08-22 DIAGNOSIS — J0110 Acute frontal sinusitis, unspecified: Secondary | ICD-10-CM

## 2021-08-22 MED ORDER — HYDROCODONE-ACETAMINOPHEN 5-325 MG PO TABS: 1.0000 | ORAL_TABLET | Freq: Three times a day (TID) | ORAL | 0 refills | Status: DC | PRN

## 2021-08-22 MED ORDER — DOXYCYCLINE HYCLATE 100 MG PO TABS: 100.0000 mg | ORAL_TABLET | Freq: Two times a day (BID) | ORAL | 0 refills | Status: DC

## 2021-08-22 NOTE — Progress Notes (Signed)
   Subjective:   Patient ID: [name] [name], female    DOB: [birth year], 65 y.o.   MRN: [id number]  HPI The patient is a 65 YO female coming in for ongoing cough for weeks (covid-19 test at home). And new toe pain after a mirror fell on her left foot big toe.   Review of Systems  Constitutional: Negative.  Negative for fatigue, fever and unexpected weight change.  HENT:  Positive for postnasal drip and sinus pressure. Negative for ear discharge, ear pain, sinus pain, sneezing, sore throat, tinnitus, trouble swallowing and voice change.   Eyes: Negative.   Respiratory:  Negative for cough, chest tightness, shortness of breath and wheezing.   Cardiovascular: Negative.  Negative for chest pain, palpitations and leg swelling.  Gastrointestinal: Negative.  Negative for abdominal distention, abdominal pain, constipation, diarrhea, nausea and vomiting.  Musculoskeletal:  Positive for gait problem, joint swelling and myalgias.  Skin: Negative.   Psychiatric/Behavioral: Negative.     Objective:  Physical Exam Constitutional:      Appearance: She is well-developed.  HENT:     Head: Normocephalic and atraumatic.     Right Ear: Ear canal normal.     Left Ear: Ear canal normal.     Nose: Congestion and rhinorrhea present.     Mouth/Throat:     Pharynx: Posterior oropharyngeal erythema present.  Cardiovascular:     Rate and Rhythm: Normal rate and regular rhythm.  Pulmonary:     Effort: Pulmonary effort is normal. No respiratory distress.     Breath sounds: Normal breath sounds. No wheezing or rales.  Abdominal:     General: Bowel sounds are normal. There is no distension.     Palpations: Abdomen is soft.     Tenderness: There is no abdominal tenderness. There is no rebound.  Musculoskeletal:        General: Tenderness present.     Cervical back: Normal range of motion.     Comments: Extensive pain left 1st toe with bruising and bruising on the 2nd toe as well.  Skin:    General: Skin is  warm and dry.  Neurological:     Mental Status: She is alert and oriented to person, place, and time.     Coordination: Coordination normal.    Vitals:   08/22/21 1545  BP: 126/88  Pulse: 78  Resp: 18  Temp: 98.4 F (36.9 C)  TempSrc: Oral  SpO2: 98%  Height: 5' (1.524 m)    This visit occurred during the SARS-CoV-2 public health emergency.  Safety protocols were in place, including screening questions prior to the visit, additional usage of staff PPE, and extensive cleaning of exam room while observing appropriate contact time as indicated for disinfecting solutions.   Assessment & Plan:

## 2021-08-22 NOTE — Patient Instructions (Signed)
We are checking the x-ray of the foot today. We have sent in hydrocodone for pain if needed up to 3 times a day for the next few days.  We have sent in doxycycline for the lungs to clear this up. Take 1 pill twice a day for 7 days.

## 2021-08-24 ENCOUNTER — Other Ambulatory Visit: Payer: Self-pay | Admitting: Internal Medicine

## 2021-08-24 DIAGNOSIS — M79672 Pain in left foot: Secondary | ICD-10-CM | POA: Insufficient documentation

## 2021-08-24 DIAGNOSIS — M84478A Pathological fracture, left toe(s), initial encounter for fracture: Secondary | ICD-10-CM

## 2021-08-24 NOTE — Assessment & Plan Note (Signed)
Ordered x-ray as there is suspicion of broken toe based on the extensive pain and bruising on exam. Rx hydrocodone for pain. She is in walking shoe.

## 2021-08-24 NOTE — Assessment & Plan Note (Signed)
Rx doxycyline for acute sinus infection ongoing 3 weeks now. Advised to continue her otc antihistamine.

## 2021-08-24 NOTE — Assessment & Plan Note (Signed)
Ongoing for weeks suspicious for sinus infection. Rx doxycycline and she can use tessalon perles as well.

## 2021-08-25 ENCOUNTER — Ambulatory Visit: Payer: Medicare Other | Admitting: Internal Medicine

## 2021-08-25 DIAGNOSIS — M79675 Pain in left toe(s): Secondary | ICD-10-CM | POA: Diagnosis not present

## 2021-08-25 DIAGNOSIS — S92422B Displaced fracture of distal phalanx of left great toe, initial encounter for open fracture: Secondary | ICD-10-CM | POA: Diagnosis not present

## 2021-08-26 ENCOUNTER — Telehealth: Payer: Self-pay | Admitting: Internal Medicine

## 2021-08-26 NOTE — Telephone Encounter (Signed)
Patient states she was seen by an orthopedic and no longer needs the podiatry referral

## 2021-08-29 ENCOUNTER — Other Ambulatory Visit (HOSPITAL_COMMUNITY): Payer: Self-pay | Admitting: Internal Medicine

## 2021-08-29 DIAGNOSIS — Z1231 Encounter for screening mammogram for malignant neoplasm of breast: Secondary | ICD-10-CM

## 2021-09-01 DIAGNOSIS — S92425A Nondisplaced fracture of distal phalanx of left great toe, initial encounter for closed fracture: Secondary | ICD-10-CM | POA: Diagnosis not present

## 2021-09-02 ENCOUNTER — Ambulatory Visit (HOSPITAL_COMMUNITY)
Admission: RE | Admit: 2021-09-02 | Discharge: 2021-09-02 | Disposition: A | Discharge status: Home / Self Care | Payer: Medicare Other | Source: Ambulatory Visit | Attending: Internal Medicine | Admitting: Internal Medicine

## 2021-09-02 ENCOUNTER — Other Ambulatory Visit: Payer: Self-pay

## 2021-09-02 DIAGNOSIS — Z1231 Encounter for screening mammogram for malignant neoplasm of breast: Secondary | ICD-10-CM | POA: Diagnosis not present

## 2021-09-20 ENCOUNTER — Telehealth: Payer: Self-pay | Admitting: Internal Medicine

## 2021-09-20 NOTE — Telephone Encounter (Signed)
1.Medication Requested: acitretin (SORIATANE) 25 MG capsule albuterol (VENTOLIN HFA) 108 (90 Base) MCG/ACT inhaler Alcohol Swabs (B-D SINGLE USE SWABS REGULAR) PADS ALPRAZolam (XANAX) 1 MG tablet amLODipine (NORVASC) 10 MG tablet aspirin 325 MG tablet benzonatate (TESSALON) 200 MG capsule Blood Glucose Calibration (ACCU-CHEK SMARTVIEW CONTROL) LIQD Blood Glucose Monitoring Suppl (ACCU-CHEK NANO SMARTVIEW) w/Device KIT clobetasol ointment (TEMOVATE) 0.05 % co-enzyme Q-10 30 MG capsule doxycycline (VIBRA-TABS) 100 MG tablet famotidine (PEPCID) 40 MG tablet fluocinonide ointment (LIDEX) 0.05 % fluticasone (FLONASE) 50 MCG/ACT nasal spray folic acid (FOLVITE) 1 MG tablet furosemide (LASIX) 20 MG tablet glucose blood (ACCU-CHEK SMARTVIEW) test strip HYDROcodone-acetaminophen (NORCO/VICODIN) 5-325 MG tablet Lancet Devices (ADJUSTABLE LANCING DEVICE) MISC Lancets MISC lidocaine (XYLOCAINE) 2 % solution LINZESS 145 MCG CAPS capsule losartan (COZAAR) 50 MG tablet meclizine (ANTIVERT) 12.5 MG tablet metFORMIN (GLUCOPHAGE) 500 MG tablet methocarbamol (ROBAXIN) 500 MG tablet methotrexate (RHEUMATREX) 2.5 MG tablet Multiple Vitamins-Minerals (CENTRUM ADULTS PO) naproxen (NAPROSYN) 500 MG tablet omeprazole (PRILOSEC) 20 MG capsule polyethylene glycol powder (GLYCOLAX/MIRALAX) 17 GM/SCOOP powder rosuvastatin (CRESTOR) 10 MG tablet spironolactone (ALDACTONE) 25 MG tablet tacrolimus (PROGRAF) 1 MG capsule traMADol (ULTRAM) 50 MG tablet triamcinolone cream (KENALOG) 0.1 % vitamin B-12 (CYANOCOBALAMIN) 1000 MCG tablet  2. Pharmacy (Name, Donora, [facility]): [facility] ([facility] ) - [place], [place]  Phone:  [phone] Fax:  [phone]   3. On Med List: yes  4. Last Visit with PCP: 11.10.22  5. Next visit date with PCP: 01.13.23   Agent: Please be advised that RX refills may take up to 3 business days. We ask that you follow-up with your pharmacy.

## 2021-09-20 NOTE — Telephone Encounter (Signed)
See below

## 2021-09-21 NOTE — Telephone Encounter (Signed)
Spoke with the patient and she stated that she needs a refill on Metformin, Amlodipine, Spironolactone, Triamcinolone cream and alcohol pads. She declined a visit for her Tramadol.

## 2021-09-21 NOTE — Telephone Encounter (Signed)
I'm not sure what to do with this message. Some of these medications we do not prescribe for her and some are over the counter. She is not due for refill of xanax and needs visit for refill on tramadol. Hydrocodone for short term only for broken toe and should not be refilled. Can she clarify what is needed? A lot of her medications were sent in for 1 year supply to her local pharmacy at last visit.

## 2021-09-22 MED ORDER — AMLODIPINE BESYLATE 10 MG PO TABS
10.0000 mg | ORAL_TABLET | Freq: Every morning | ORAL | 3 refills | Status: DC
Start: 2021-09-22 — End: 2022-04-10

## 2021-09-22 MED ORDER — METFORMIN HCL 500 MG PO TABS: 500.0000 mg | ORAL_TABLET | Freq: Two times a day (BID) | ORAL | 3 refills | Status: DC

## 2021-09-22 MED ORDER — BD SWAB SINGLE USE REGULAR PADS: 200.0000 | MEDICATED_PAD | Freq: Two times a day (BID) | 3 refills | Status: DC

## 2021-09-22 MED ORDER — SPIRONOLACTONE 25 MG PO TABS: 25.0000 mg | ORAL_TABLET | Freq: Every morning | ORAL | 3 refills | Status: DC

## 2021-09-22 MED ORDER — TRIAMCINOLONE ACETONIDE 0.1 % EX CREA: TOPICAL_CREAM | CUTANEOUS | 3 refills | Status: DC

## 2021-09-22 NOTE — Telephone Encounter (Signed)
Refill has been sent to the patient's pharmacy.  

## 2021-09-22 NOTE — Telephone Encounter (Signed)
Ok to refill those.

## 2021-10-25 ENCOUNTER — Other Ambulatory Visit: Payer: Self-pay | Admitting: Internal Medicine

## 2021-10-28 ENCOUNTER — Other Ambulatory Visit: Payer: Self-pay

## 2021-10-28 ENCOUNTER — Encounter: Payer: Self-pay | Admitting: Internal Medicine

## 2021-10-28 ENCOUNTER — Ambulatory Visit (INDEPENDENT_AMBULATORY_CARE_PROVIDER_SITE_OTHER): Payer: Commercial Managed Care - HMO | Admitting: Internal Medicine

## 2021-10-28 VITALS — BP 130/62 | HR 76 | Resp 18 | Ht 60.0 in | Wt 234.0 lb

## 2021-10-28 DIAGNOSIS — E118 Type 2 diabetes mellitus with unspecified complications: Secondary | ICD-10-CM

## 2021-10-28 DIAGNOSIS — M79672 Pain in left foot: Secondary | ICD-10-CM

## 2021-10-28 DIAGNOSIS — G894 Chronic pain syndrome: Secondary | ICD-10-CM

## 2021-10-28 LAB — POCT GLYCOSYLATED HEMOGLOBIN (HGB A1C): Hemoglobin A1C: 6.4 % — AB (ref 4.0–5.6)

## 2021-10-28 MED ORDER — TRAMADOL HCL 50 MG PO TABS: 50.0000 mg | ORAL_TABLET | Freq: Every day | ORAL | 3 refills | Status: DC | PRN

## 2021-10-28 NOTE — Progress Notes (Signed)
° °  Subjective:   Patient ID: [name] [name], female    DOB: Nov 09, 1955, 66 y.o.   MRN: [id number]  HPI The patient is a 66 YO female coming in for follow up.   Review of Systems  Constitutional: Negative.   HENT: Negative.    Eyes: Negative.   Respiratory:  Negative for cough, chest tightness and shortness of breath.   Cardiovascular:  Negative for chest pain, palpitations and leg swelling.  Gastrointestinal:  Negative for abdominal distention, abdominal pain, constipation, diarrhea, nausea and vomiting.  Musculoskeletal:  Positive for arthralgias.  Skin: Negative.   Neurological: Negative.   Psychiatric/Behavioral: Negative.     Objective:  Physical Exam Constitutional:      Appearance: She is well-developed.  HENT:     Head: Normocephalic and atraumatic.  Cardiovascular:     Rate and Rhythm: Normal rate and regular rhythm.  Pulmonary:     Effort: Pulmonary effort is normal. No respiratory distress.     Breath sounds: Normal breath sounds. No wheezing or rales.  Abdominal:     General: Bowel sounds are normal. There is no distension.     Palpations: Abdomen is soft.     Tenderness: There is no abdominal tenderness. There is no rebound.  Musculoskeletal:        General: Tenderness present.     Cervical back: Normal range of motion.  Skin:    General: Skin is warm and dry.  Neurological:     Mental Status: She is alert and oriented to person, place, and time.     Coordination: Coordination normal.    Vitals:   10/28/21 1450  BP: 130/62  Pulse: 76  Resp: 18  SpO2: 100%  Weight: 234 lb (106.1 kg)  Height: 5' (1.524 m)    This visit occurred during the SARS-CoV-2 public health emergency.  Safety protocols were in place, including screening questions prior to the visit, additional usage of staff PPE, and extensive cleaning of exam room while observing appropriate contact time as indicated for disinfecting solutions.   Assessment & Plan:

## 2021-10-28 NOTE — Assessment & Plan Note (Signed)
Still healing from toe fracture and following up with orthopedics for healing.

## 2021-10-28 NOTE — Patient Instructions (Addendum)
We have sent in the refills.   The HgA1c is 6.4 so you are doing great.

## 2021-10-28 NOTE — Assessment & Plan Note (Signed)
POC HgA1c done today at 6.4 which is at goal. Continue metformin 500 mg BID.

## 2021-10-28 NOTE — Assessment & Plan Note (Signed)
Refilled tramadol #15 with 3 refills. Appeared to have been filling with about once a month previously.

## 2021-11-03 DIAGNOSIS — S92425A Nondisplaced fracture of distal phalanx of left great toe, initial encounter for closed fracture: Secondary | ICD-10-CM | POA: Diagnosis not present

## 2021-11-03 DIAGNOSIS — M79672 Pain in left foot: Secondary | ICD-10-CM | POA: Diagnosis not present

## 2021-11-22 DIAGNOSIS — Z79631 Long term (current) use of antimetabolite agent: Secondary | ICD-10-CM | POA: Diagnosis not present

## 2021-11-22 DIAGNOSIS — L438 Other lichen planus: Secondary | ICD-10-CM | POA: Diagnosis not present

## 2021-11-22 DIAGNOSIS — B370 Candidal stomatitis: Secondary | ICD-10-CM | POA: Diagnosis not present

## 2021-11-22 DIAGNOSIS — Z5181 Encounter for therapeutic drug level monitoring: Secondary | ICD-10-CM | POA: Diagnosis not present

## 2022-01-18 ENCOUNTER — Other Ambulatory Visit: Payer: Self-pay | Admitting: Internal Medicine

## 2022-01-23 ENCOUNTER — Other Ambulatory Visit: Payer: Self-pay | Admitting: Internal Medicine

## 2022-02-07 DIAGNOSIS — K219 Gastro-esophageal reflux disease without esophagitis: Secondary | ICD-10-CM | POA: Diagnosis not present

## 2022-02-07 DIAGNOSIS — R490 Dysphonia: Secondary | ICD-10-CM | POA: Diagnosis not present

## 2022-03-22 ENCOUNTER — Other Ambulatory Visit: Payer: Self-pay | Admitting: Internal Medicine

## 2022-03-25 ENCOUNTER — Other Ambulatory Visit: Payer: Self-pay | Admitting: Internal Medicine

## 2022-03-28 DIAGNOSIS — L438 Other lichen planus: Secondary | ICD-10-CM | POA: Diagnosis not present

## 2022-03-28 DIAGNOSIS — L439 Lichen planus, unspecified: Secondary | ICD-10-CM | POA: Diagnosis not present

## 2022-03-28 DIAGNOSIS — B370 Candidal stomatitis: Secondary | ICD-10-CM | POA: Diagnosis not present

## 2022-03-28 DIAGNOSIS — Z5181 Encounter for therapeutic drug level monitoring: Secondary | ICD-10-CM | POA: Diagnosis not present

## 2022-04-02 ENCOUNTER — Other Ambulatory Visit: Payer: Self-pay | Admitting: Internal Medicine

## 2022-04-09 ENCOUNTER — Other Ambulatory Visit: Payer: Self-pay | Admitting: Internal Medicine

## 2022-04-15 ENCOUNTER — Other Ambulatory Visit: Payer: Self-pay | Admitting: Nurse Practitioner

## 2022-04-17 ENCOUNTER — Other Ambulatory Visit: Payer: Self-pay | Admitting: Internal Medicine

## 2022-04-19 ENCOUNTER — Other Ambulatory Visit: Payer: Self-pay | Admitting: Internal Medicine

## 2022-04-28 ENCOUNTER — Ambulatory Visit (INDEPENDENT_AMBULATORY_CARE_PROVIDER_SITE_OTHER): Payer: Medicare Other | Admitting: Internal Medicine

## 2022-04-28 ENCOUNTER — Encounter: Payer: Self-pay | Admitting: Internal Medicine

## 2022-04-28 VITALS — BP 126/70 | HR 75 | Resp 18 | Ht 60.0 in | Wt 226.0 lb

## 2022-04-28 DIAGNOSIS — Z23 Encounter for immunization: Secondary | ICD-10-CM

## 2022-04-28 DIAGNOSIS — E118 Type 2 diabetes mellitus with unspecified complications: Secondary | ICD-10-CM

## 2022-04-28 DIAGNOSIS — I10 Essential (primary) hypertension: Secondary | ICD-10-CM

## 2022-04-28 DIAGNOSIS — Z1211 Encounter for screening for malignant neoplasm of colon: Secondary | ICD-10-CM | POA: Diagnosis not present

## 2022-04-28 DIAGNOSIS — E1169 Type 2 diabetes mellitus with other specified complication: Secondary | ICD-10-CM

## 2022-04-28 DIAGNOSIS — F411 Generalized anxiety disorder: Secondary | ICD-10-CM

## 2022-04-28 DIAGNOSIS — K5909 Other constipation: Secondary | ICD-10-CM

## 2022-04-28 DIAGNOSIS — E133299 Other specified diabetes mellitus with mild nonproliferative diabetic retinopathy without macular edema, unspecified eye: Secondary | ICD-10-CM

## 2022-04-28 DIAGNOSIS — Z0001 Encounter for general adult medical examination with abnormal findings: Secondary | ICD-10-CM

## 2022-04-28 DIAGNOSIS — K219 Gastro-esophageal reflux disease without esophagitis: Secondary | ICD-10-CM

## 2022-04-28 DIAGNOSIS — E785 Hyperlipidemia, unspecified: Secondary | ICD-10-CM

## 2022-04-28 DIAGNOSIS — C541 Malignant neoplasm of endometrium: Secondary | ICD-10-CM

## 2022-04-28 LAB — MICROALBUMIN / CREATININE URINE RATIO
Creatinine,U: 166.3 mg/dL
Microalb Creat Ratio: 2.2 mg/g (ref 0.0–30.0)
Microalb, Ur: 3.7 mg/dL — ABNORMAL HIGH (ref 0.0–1.9)

## 2022-04-28 LAB — LIPID PANEL
Cholesterol: 116 mg/dL (ref 0–200)
HDL: 42.9 mg/dL (ref >39.00)
LDL Cholesterol: 55 mg/dL (ref 0–99)
NonHDL: 73.11
Total CHOL/HDL Ratio: 3
Triglycerides: 90 mg/dL (ref 0.0–149.0)
VLDL: 18 mg/dL (ref 0.0–40.0)

## 2022-04-28 LAB — COMPREHENSIVE METABOLIC PANEL
ALT: 9 U/L (ref 0–35)
AST: 18 U/L (ref 0–37)
Albumin: 4.2 g/dL (ref 3.5–5.2)
Alkaline Phosphatase: 113 U/L (ref 39–117)
BUN: 15 mg/dL (ref 6–23)
CO2: 31 mEq/L (ref 19–32)
Calcium: 9.7 mg/dL (ref 8.4–10.5)
Chloride: 102 mEq/L (ref 96–112)
Creatinine, Ser: 0.93 mg/dL (ref 0.40–1.20)
GFR: 63.8 mL/min (ref >60.00)
Glucose, Bld: 103 mg/dL — ABNORMAL HIGH (ref 70–99)
Potassium: 4.3 mEq/L (ref 3.5–5.1)
Sodium: 139 mEq/L (ref 135–145)
Total Bilirubin: 0.6 mg/dL (ref 0.2–1.2)
Total Protein: 7.7 g/dL (ref 6.0–8.3)

## 2022-04-28 LAB — CBC
HCT: 35.6 % — ABNORMAL LOW (ref 36.0–46.0)
Hemoglobin: 11.4 g/dL — ABNORMAL LOW (ref 12.0–15.0)
MCHC: 32 g/dL (ref 30.0–36.0)
MCV: 86.9 fl (ref 78.0–100.0)
Platelets: 284 10*3/uL (ref 150.0–400.0)
RBC: 4.1 Mil/uL (ref 3.87–5.11)
RDW: 16 % — ABNORMAL HIGH (ref 11.5–15.5)
WBC: 5.2 10*3/uL (ref 4.0–10.5)

## 2022-04-28 LAB — HEMOGLOBIN A1C: Hgb A1c MFr Bld: 7 % — ABNORMAL HIGH (ref 4.6–6.5)

## 2022-04-28 NOTE — Assessment & Plan Note (Signed)
BMI 44 counseled about diet and exercise.

## 2022-04-28 NOTE — Assessment & Plan Note (Signed)
S/P surgery 2013 and no symptoms currently or evidence for recurrence. Continue to monitor clinically.

## 2022-04-28 NOTE — Assessment & Plan Note (Signed)
Uses linzess 145 mcg daily and can refill if/when needed.

## 2022-04-28 NOTE — Assessment & Plan Note (Signed)
Saw eye doctor earlier this week will get records.

## 2022-04-28 NOTE — Assessment & Plan Note (Signed)
Foot exam done, eye exam earlier this week. Checking HgA1c and lipid panel and microalbumin creatinine ratio. Taking metformin 500 mg BID. Taking ARB and statin. Adjust as needed.

## 2022-04-28 NOTE — Assessment & Plan Note (Signed)
Flu shot yearly. Covid-19 counseled. Pneumonia 20 given today. Shingrix complete. Tetanus due declines. Cologuard ordered. Mammogram due Nov 2023, pap smear not indicated and dexa complete. Counseled about sun safety and mole surveillance. Counseled about the dangers of distracted driving. Given 10 year screening recommendations.

## 2022-04-28 NOTE — Assessment & Plan Note (Signed)
Taking pepcid 20 mg daily and omeprazole 20 mg daily. Continue for now as well controlled.

## 2022-04-28 NOTE — Assessment & Plan Note (Signed)
BP at goal on amlodipine 10 mg daily and spironolactone 25 mg daily and losartan 50 mg daily and lasix 20 mg daily. Adjust as needed after labs.

## 2022-04-28 NOTE — Assessment & Plan Note (Signed)
Checking lipid panel and adjust as needed for LDL <100. Taking crestor 10 mg daily.

## 2022-04-28 NOTE — Assessment & Plan Note (Signed)
Uses xanax 1 mg qhs prn and refilled April for 6 month supply. [employer] database reviewed and appropriate today. She is not taking every day. Understands about risk of dependence, falls, memory change.

## 2022-04-28 NOTE — Patient Instructions (Signed)
We will check the labs today. 

## 2022-04-28 NOTE — Progress Notes (Signed)
   Subjective:   Patient ID: [name] [name], female    DOB: [birth year], 66 y.o.   MRN: [id number]  HPI The patient is here for physical.  PMH, [facility], social history reviewed and updated  Review of Systems  Constitutional: Negative.   HENT: Negative.    Eyes: Negative.   Respiratory:  Positive for shortness of breath. Negative for cough and chest tightness.   Cardiovascular:  Negative for chest pain, palpitations and leg swelling.  Gastrointestinal:  Negative for abdominal distention, abdominal pain, constipation, diarrhea, nausea and vomiting.  Musculoskeletal:  Positive for arthralgias and myalgias.  Skin: Negative.   Neurological:  Positive for numbness.  Psychiatric/Behavioral: Negative.      Objective:  Physical Exam Constitutional:      Appearance: She is well-developed. She is obese.  HENT:     Head: Normocephalic and atraumatic.  Cardiovascular:     Rate and Rhythm: Normal rate and regular rhythm.  Pulmonary:     Effort: Pulmonary effort is normal. No respiratory distress.     Breath sounds: Normal breath sounds. No wheezing or rales.  Abdominal:     General: Bowel sounds are normal. There is no distension.     Palpations: Abdomen is soft.     Tenderness: There is no abdominal tenderness. There is no rebound.  Musculoskeletal:     Cervical back: Normal range of motion.  Skin:    General: Skin is warm and dry.     Comments: Foot exam done  Neurological:     Mental Status: She is alert and oriented to person, place, and time.     Coordination: Coordination normal.     Vitals:   04/28/22 1345  BP: 126/70  Pulse: 75  Resp: 18  SpO2: 97%  Weight: 226 lb (102.5 kg)  Height: 5' (1.524 m)    Assessment & Plan:  Prevnar 20 given at visit

## 2022-05-02 ENCOUNTER — Other Ambulatory Visit: Payer: Self-pay | Admitting: Internal Medicine

## 2022-05-15 LAB — COLOGUARD

## 2022-05-23 DIAGNOSIS — Z1211 Encounter for screening for malignant neoplasm of colon: Secondary | ICD-10-CM | POA: Diagnosis not present

## 2022-05-31 LAB — COLOGUARD: COLOGUARD: NEGATIVE

## 2022-06-15 DIAGNOSIS — M65341 Trigger finger, right ring finger: Secondary | ICD-10-CM | POA: Diagnosis not present

## 2022-06-15 DIAGNOSIS — M65321 Trigger finger, right index finger: Secondary | ICD-10-CM | POA: Diagnosis not present

## 2022-07-03 ENCOUNTER — Telehealth: Payer: Self-pay | Admitting: Internal Medicine

## 2022-07-03 NOTE — Telephone Encounter (Signed)
LVM for pt to rtn my call to schedule AWV with [facility] call back # [phone] 

## 2022-07-06 ENCOUNTER — Other Ambulatory Visit: Payer: Self-pay | Admitting: Internal Medicine

## 2022-07-12 DIAGNOSIS — G5611 Other lesions of median nerve, right upper limb: Secondary | ICD-10-CM | POA: Diagnosis not present

## 2022-07-13 ENCOUNTER — Ambulatory Visit: Payer: Medicare Other

## 2022-07-13 DIAGNOSIS — M79644 Pain in right finger(s): Secondary | ICD-10-CM | POA: Diagnosis not present

## 2022-07-13 DIAGNOSIS — G5601 Carpal tunnel syndrome, right upper limb: Secondary | ICD-10-CM | POA: Diagnosis not present

## 2022-07-21 ENCOUNTER — Ambulatory Visit (INDEPENDENT_AMBULATORY_CARE_PROVIDER_SITE_OTHER): Payer: Medicare Other

## 2022-07-21 VITALS — Ht 60.0 in | Wt 225.0 lb

## 2022-07-21 DIAGNOSIS — Z0000 Encounter for general adult medical examination without abnormal findings: Secondary | ICD-10-CM

## 2022-07-21 NOTE — Patient Instructions (Signed)
[name] , Thank you for taking time to come for your Medicare Wellness Visit. I appreciate your ongoing commitment to your health goals. Please review the following plan we discussed and let me know if I can assist you in the future.   These are the goals we discussed:  Goals      My goal is to stay healthy.        This is a list of the screening recommended for you and due dates:  Health Maintenance  Topic Date Due   Tetanus Vaccine  03/21/2020   COVID-19 Vaccine (5 - Moderna risk series) 11/25/2020   Eye exam for diabetics  11/11/2021   Colon Cancer Screening  04/09/2022   Flu Shot  05/16/2022   Hemoglobin A1C  10/29/2022   Yearly kidney function blood test for diabetes  04/29/2023   Yearly kidney health urinalysis for diabetes  04/29/2023   Complete foot exam   04/29/2023   Mammogram  09/03/2023   Pneumonia Vaccine  Completed   DEXA scan (bone density measurement)  Completed   Hepatitis C Screening: USPSTF Recommendation to screen - Ages 15-79 yo.  Completed   Zoster (Shingles) Vaccine  Completed   HPV Vaccine  Aged Out    Advanced directives: No  Conditions/risks identified: Yes  Next appointment: Follow up in one year for your annual wellness visit on 07/23/2023 at 3:00 p.m. telephone visit with Mignon Pine, Nurse Health Advisor at [facility].  If you need to reschedule or cancel, please call [phone].   Preventive Care 85 Years and Older, Female Preventive care refers to lifestyle choices and visits with your health care provider that can promote health and wellness. What does preventive care include? A yearly physical exam. This is also called an annual well check. Dental exams once or twice a year. Routine eye exams. Ask your health care provider how often you should have your eyes checked. Personal lifestyle choices, including: Daily care of your teeth and gums. Regular physical activity. Eating a healthy diet. Avoiding tobacco and  drug use. Limiting alcohol use. Practicing safe sex. Taking low-dose aspirin every day. Taking vitamin and mineral supplements as recommended by your health care provider. What happens during an annual well check? The services and screenings done by your health care provider during your annual well check will depend on your age, overall health, lifestyle risk factors, and family history of disease. Counseling  Your health care provider may ask you questions about your: Alcohol use. Tobacco use. Drug use. Emotional well-being. Home and relationship well-being. Sexual activity. Eating habits. History of falls. Memory and ability to understand (cognition). Work and work Statistician. Reproductive health. Screening  You may have the following tests or measurements: Height, weight, and BMI. Blood pressure. Lipid and cholesterol levels. These may be checked every 5 years, or more frequently if you are over 15 years old. Skin check. Lung cancer screening. You may have this screening every year starting at age 38 if you have a 30-pack-year history of smoking and currently smoke or have quit within the past 15 years. Fecal occult blood test (FOBT) of the stool. You may have this test every year starting at age 39. Flexible sigmoidoscopy or colonoscopy. You may have a sigmoidoscopy every 5 years or a colonoscopy every 10 years starting at age 41. Hepatitis C blood test. Hepatitis B blood test. Sexually transmitted disease (STD) testing. Diabetes screening. This is done by checking your blood sugar (glucose) after you have  not eaten for a while (fasting). You may have this done every 1-3 years. Bone density scan. This is done to screen for osteoporosis. You may have this done starting at age 68. Mammogram. This may be done every 1-2 years. Talk to your health care provider about how often you should have regular mammograms. Talk with your health care provider about your test results, treatment  options, and if necessary, the need for more tests. Vaccines  Your health care provider may recommend certain vaccines, such as: Influenza vaccine. This is recommended every year. Tetanus, diphtheria, and acellular pertussis (Tdap, Td) vaccine. You may need a Td booster every 10 years. Zoster vaccine. You may need this after age 75. Pneumococcal 13-valent conjugate (PCV13) vaccine. One dose is recommended after age 44. Pneumococcal polysaccharide (PPSV23) vaccine. One dose is recommended after age 40. Talk to your health care provider about which screenings and vaccines you need and how often you need them. This information is not intended to replace advice given to you by your health care provider. Make sure you discuss any questions you have with your health care provider. Document Released: 10/29/2015 Document Revised: 06/21/2016 Document Reviewed: 08/03/2015 Elsevier Interactive Patient Education  2017 [place] Prevention in the Home Falls can cause injuries. They can happen to people of all ages. There are many things you can do to make your home safe and to help prevent falls. What can I do on the outside of my home? Regularly fix the edges of walkways and driveways and fix any cracks. Remove anything that might make you trip as you walk through a door, such as a raised step or threshold. Trim any bushes or trees on the path to your home. Use bright outdoor lighting. Clear any walking paths of anything that might make someone trip, such as rocks or tools. Regularly check to see if handrails are loose or broken. Make sure that both sides of any steps have handrails. Any raised decks and porches should have guardrails on the edges. Have any leaves, snow, or ice cleared regularly. Use sand or salt on walking paths during winter. Clean up any spills in your garage right away. This includes oil or grease spills. What can I do in the bathroom? Use night lights. Install grab  bars by the toilet and in the tub and shower. Do not use towel bars as grab bars. Use non-skid mats or decals in the tub or shower. If you need to sit down in the shower, use a plastic, non-slip stool. Keep the floor dry. Clean up any water that spills on the floor as soon as it happens. Remove soap buildup in the tub or shower regularly. Attach bath mats securely with double-sided non-slip rug tape. Do not have throw rugs and other things on the floor that can make you trip. What can I do in the bedroom? Use night lights. Make sure that you have a light by your bed that is easy to reach. Do not use any sheets or blankets that are too big for your bed. They should not hang down onto the floor. Have a firm chair that has side arms. You can use this for support while you get dressed. Do not have throw rugs and other things on the floor that can make you trip. What can I do in the kitchen? Clean up any spills right away. Avoid walking on wet floors. Keep items that you use a lot in easy-to-reach places. If you need to  reach something above you, use a strong step stool that has a grab bar. Keep electrical cords out of the way. Do not use floor polish or wax that makes floors slippery. If you must use wax, use non-skid floor wax. Do not have throw rugs and other things on the floor that can make you trip. What can I do with my stairs? Do not leave any items on the stairs. Make sure that there are handrails on both sides of the stairs and use them. Fix handrails that are broken or loose. Make sure that handrails are as long as the stairways. Check any carpeting to make sure that it is firmly attached to the stairs. Fix any carpet that is loose or worn. Avoid having throw rugs at the top or bottom of the stairs. If you do have throw rugs, attach them to the floor with carpet tape. Make sure that you have a light switch at the top of the stairs and the bottom of the stairs. If you do not have them,  ask someone to add them for you. What else can I do to help prevent falls? Wear shoes that: Do not have Alvi heels. Have rubber bottoms. Are comfortable and fit you well. Are closed at the toe. Do not wear sandals. If you use a stepladder: Make sure that it is fully opened. Do not climb a closed stepladder. Make sure that both sides of the stepladder are locked into place. Ask someone to hold it for you, if possible. Clearly mark and make sure that you can see: Any grab bars or handrails. First and last steps. Where the edge of each step is. Use tools that help you move around (mobility aids) if they are needed. These include: Canes. Walkers. Scooters. Crutches. Turn on the lights when you go into a dark area. Replace any light bulbs as soon as they burn out. Set up your furniture so you have a clear path. Avoid moving your furniture around. If any of your floors are uneven, fix them. If there are any pets around you, be aware of where they are. Review your medicines with your doctor. Some medicines can make you feel dizzy. This can increase your chance of falling. Ask your doctor what other things that you can do to help prevent falls. This information is not intended to replace advice given to you by your health care provider. Make sure you discuss any questions you have with your health care provider. Document Released: 07/29/2009 Document Revised: 03/09/2016 Document Reviewed: 11/06/2014 Elsevier Interactive Patient Education  2017 [employer].

## 2022-07-21 NOTE — Progress Notes (Signed)
Virtual Visit via Telephone Note  I connected with  Pavielle B Tauzin on 07/21/22 at  2:45 PM EDT by telephone and verified that I am speaking with the correct person using two identifiers.  Location: Patient: Home Provider: [place] Persons participating in the virtual visit: [place]   I discussed the limitations, risks, security and privacy concerns of performing an evaluation and management service by telephone and the availability of in person appointments. The patient expressed understanding and agreed to proceed.  Interactive audio and video telecommunications were attempted between this nurse and patient, however failed, due to patient having technical difficulties OR patient did not have access to video capability.  We continued and completed visit with audio only.  Some vital signs may be absent or patient reported.   Sheral Flow, LPN  Subjective:   TANAYAH SQUITIERI is a 66 y.o. female who presents for Medicare Annual (Subsequent) preventive examination.  Review of Systems     Cardiac Risk Factors include: advanced age (>59men, >46 women);diabetes mellitus;dyslipidemia;family history of premature cardiovascular disease;hypertension;obesity (BMI >30kg/m2)     Objective:    Today's Vitals   07/21/22 1501  Weight: 225 lb (102.1 kg)  Height: 5' (1.524 m)  PainSc: 0-No pain   Body mass index is 43.94 kg/m.     07/21/2022    2:53 PM 07/05/2021    6:11 PM 03/08/2020    3:11 PM 12/10/2018    9:18 AM 09/30/2018    2:17 PM 05/08/2016   11:44 AM 06/11/2012    9:46 AM  Advanced Directives  Does Patient Have a Medical Advance Directive? No No No No No No Patient does not have advance directive  Would patient like information on creating a medical advance directive? No - Patient declined Yes (ED - Information included in AVS) Yes (MAU/Ambulatory/Procedural Areas - Information given) No - Patient declined No - Patient declined No - patient declined  information     Current Medications (verified) Outpatient Encounter Medications as of 07/21/2022  Medication Sig   acitretin (SORIATANE) 25 MG capsule Take 25 mg by mouth daily.    albuterol (VENTOLIN HFA) 108 (90 Base) MCG/ACT inhaler INHALE 2 PUFFS INTO THE LUNGS EVERY 4 HOURS AS NEEDED FOR WHEEZING OR SHORTNESS OF BREATH.   Alcohol Swabs (B-D SINGLE USE SWABS REGULAR) PADS 200 packets by Other route 2 (two) times daily.   ALPRAZolam (XANAX) 1 MG tablet TAKE 1 TABLET(1 MG) BY MOUTH AT BEDTIME AS NEEDED FOR ANXIETY   amLODipine (NORVASC) 10 MG tablet TAKE 1 TABLET BY MOUTH IN THE  MORNING   aspirin 325 MG tablet Take 325 mg by mouth every morning.   Blood Glucose Calibration (ACCU-CHEK SMARTVIEW CONTROL) LIQD USE AS DIRECTED   Blood Glucose Monitoring Suppl (ACCU-CHEK NANO SMARTVIEW) w/Device KIT Use as directed to monitor FSBS 2x daily. Dx: E11.69   clobetasol ointment (TEMOVATE) 0.05 % APPLY IN SORE AREAS OF MOUTH BID. NOT ON FACE/GROIN   co-enzyme Q-10 30 MG capsule Take 1 capsule (30 mg total) by mouth 3 (three) times daily.   famotidine (PEPCID) 40 MG tablet Take 20 mg by mouth daily.   fluocinonide ointment (LIDEX) 0.05 % Apply to affected areas of body twice daily. Never on face/groin   fluticasone (FLONASE) 50 MCG/ACT nasal spray SHAKE LIQUID AND USE 2 SPRAYS IN EACH NOSTRIL DAILY   folic acid (FOLVITE) 1 MG tablet Take 1 mg by mouth daily.   furosemide (LASIX) 20 MG tablet Take 1 tablet (20  mg total) by mouth daily as needed.   glucose blood (ACCU-CHEK SMARTVIEW) test strip Use as directed to monitor FSBS daily. Dx: E11.69   Lancet Devices (ADJUSTABLE LANCING DEVICE) MISC TEST FASTING BLOOD SUGAR TWICE PER DAY DX; E11.9   Lancets MISC Use as directed to monitor FSBS 2x daily. Dx: E11.9.   lidocaine (XYLOCAINE) 2 % solution Use as directed 5-10 mLs in the mouth or throat 2 (two) times daily.   LINZESS 145 MCG CAPS capsule TAKE 1 CAPSULE (145 MCG TOTAL) BY MOUTH DAILY BEFORE  BREAKFAST.   losartan (COZAAR) 50 MG tablet TAKE 1 TABLET(50 MG) BY MOUTH DAILY   meclizine (ANTIVERT) 12.5 MG tablet Take 1 tablet (12.5 mg total) by mouth 3 (three) times daily as needed for dizziness.   metFORMIN (GLUCOPHAGE) 500 MG tablet TAKE 1 TABLET BY MOUTH TWICE  DAILY WITH MEALS   methocarbamol (ROBAXIN) 500 MG tablet Take 1 tablet (500 mg total) by mouth every 6 (six) hours as needed for muscle spasms.   methotrexate (RHEUMATREX) 2.5 MG tablet Take 3 tablets (7.5 mg total) by mouth once a week. Caution:Chemotherapy. Protect from light.   Multiple Vitamins-Minerals (CENTRUM ADULTS PO) Take by mouth.   naproxen (NAPROSYN) 500 MG tablet TAKE 1 TABLET BY MOUTH  TWICE DAILY WITH A MEAL   omeprazole (PRILOSEC) 20 MG capsule Take 1 capsule (20 mg total) by mouth daily.   polyethylene glycol powder (GLYCOLAX/MIRALAX) 17 GM/SCOOP powder MIX 1 CAPFUL (17GM) IN 8 OUNCES OF WATER AND DRINK EVERY DAY   rosuvastatin (CRESTOR) 10 MG tablet Take 1 tablet (10 mg total) by mouth at bedtime.   spironolactone (ALDACTONE) 25 MG tablet TAKE 1 TABLET BY MOUTH IN THE  MORNING   tacrolimus (PROGRAF) 1 MG capsule Take by mouth.   traMADol (ULTRAM) 50 MG tablet Take 1 tablet (50 mg total) by mouth daily as needed.   triamcinolone cream (KENALOG) 0.1 % APPLY TOPICALLY TO VULVA 2 TIMES DAILY   vitamin B-12 (CYANOCOBALAMIN) 1000 MCG tablet Take 1 tablet (1,000 mcg total) by mouth daily.   No facility-administered encounter medications on file as of 07/21/2022.    Allergies (verified) Contrast media [iodinated contrast media], Lisinopril, and Buspar [buspirone hcl]   History: Past Medical History:  Diagnosis Date   Anxiety    Cancer ([place])    Chronic abdominal pain    Constipation    Depression    Diabetes mellitus, type 2 ([place])    Essential hypertension, benign    GERD (gastroesophageal reflux disease)    Glaucoma    Last eye exam 9/08   Hiatal hernia    HSV-2 seropositive    Iron deficiency anemia     Low back pain    Mixed hyperlipidemia    Osteoarthritis    Positive H. pylori test    Schatzki's ring    TIA (transient ischemic attack)    6/10   Varicose veins    Past Surgical History:  Procedure Laterality Date   ABDOMINAL HYSTERECTOMY  08/01/2012   total and BSO due uterine cancer   CHOLECYSTECTOMY  Dec 26, 2010   COLONOSCOPY  01/2006   DILATION AND CURETTAGE OF UTERUS  06/12/12   ESOPHAGOGASTRODUODENOSCOPY     Esophageal dilation 2007   Lumbar discetomy  10/01   Dr. Larose Hires    SPINE SURGERY     TUBAL LIGATION  5053   UMBILICAL HERNIA REPAIR  12/26/2010   Family History  Problem Relation Age of Onset   Hypertension Father  Diabetes Father    Diabetes Mother    Healthy Sister    Heart disease Daughter    Healthy Daughter    Arrhythmia Daughter        Defibrillator    Scoliosis Daughter    Social History   Socioeconomic History   Marital status: Widowed    Spouse name: Not on file   Number of children: Not on file   Years of education: Not on file   Highest education level: Not on file  Occupational History   Not on file  Tobacco Use   Smoking status: Never   Smokeless tobacco: Never  Vaping Use   Vaping Use: Never used  Substance and Sexual Activity   Alcohol use: No   Drug use: No   Sexual activity: Not Currently  Other Topics Concern   Not on file  Social History Narrative   Lives with children   Social Determinants of Health   Financial Resource Strain: Low Risk  (07/21/2022)   Overall Financial Resource Strain (CARDIA)    Difficulty of Paying Living Expenses: Not hard at all  Food Insecurity: No Food Insecurity (07/21/2022)   Hunger Vital Sign    Worried About Running Out of Food in the Last Year: Never true    [place] in the Last Year: Never true  Transportation Needs: No Transportation Needs (07/21/2022)   PRAPARE - Hydrologist (Medical): No    Lack of Transportation (Non-Medical): No  Physical  Activity: Sufficiently Active (07/21/2022)   Exercise Vital Sign    Days of Exercise per Week: 5 days    Minutes of Exercise per Session: 30 min  Stress: No Stress Concern Present (07/21/2022)   [place]    Feeling of Stress : Not at all  Social Connections: Moderately Isolated (07/21/2022)   Social Connection and Isolation Panel [NHANES]    Frequency of Communication with Friends and Family: More than three times a week    Frequency of Social Gatherings with Friends and Family: Twice a week    Attends Religious Services: More than 4 times per year    Active Member of [employer] or Organizations: No    Attends Archivist Meetings: Never    Marital Status: Widowed    Tobacco Counseling Counseling given: Not Answered   Clinical Intake:  Pre-visit preparation completed: Yes  Pain : No/denies pain Pain Score: 0-No pain     BMI - recorded: 43.94 Nutritional Status: BMI > 30  Obese Nutritional Risks: None Diabetes: Yes CBG done?: No Did pt. bring in CBG monitor from home?: No  How often do you need to have someone help you when you read instructions, pamphlets, or other written materials from your doctor or pharmacy?: 1 - Never What is the last grade level you completed in school?: HSG  Nutrition Risk Assessment:  Has the patient had any N/V/D within the last 2 months?  No  Does the patient have any non-healing wounds?  No  Has the patient had any unintentional weight loss or weight gain?  No   Diabetes:  Is the patient diabetic?  Yes  If diabetic, was a CBG obtained today?  No  Did the patient bring in their glucometer from home?  No  How often do you monitor your CBG's? Daily.   Financial Strains and Diabetes Management:  Are you having any financial strains with the device, your supplies or  your medication? No .  Does the patient want to be seen by Chronic Care Management for management of their  diabetes?  No  Would the patient like to be referred to a Nutritionist or for Diabetic Management?  No   Diabetic Exams:  Diabetic Eye Exam: Completed 04/2022. Overdue for diabetic eye exam. Pt has been advised about the importance in completing this exam. A referral has been placed today. Message sent to referral coordinator for scheduling purposes. Advised pt to expect a call from office referred to regarding appt.  Diabetic Foot Exam: Completed 04/28/2022. Pt has been advised about the importance in completing this exam. Pt is scheduled for diabetic foot exam on 04/29/2023.    Interpreter Needed?: No  Information entered by :: Lisette Abu, LPN.   Activities of Daily Living    07/21/2022    3:06 PM  In your present state of health, do you have any difficulty performing the following activities:  Hearing? 0  Vision? 0  Difficulty concentrating or making decisions? 0  Walking or climbing stairs? 0  Dressing or bathing? 0  Doing errands, shopping? 0  Preparing Food and eating ? N  Using the Toilet? N  In the past six months, have you accidently leaked urine? Y  Do you have problems with loss of bowel control? N  Managing your Medications? N  Managing your Finances? N  Housekeeping or managing your Housekeeping? N    Patient Care Team: Hoyt Koch, MD as PCP - General (Internal Medicine) Josue Hector, MD (Cardiology) Edythe Clarity, [facility] as Pharmacist (Pharmacist) [place], [place] as Consulting Physician (Optometry)  Indicate any recent Medical Services you may have received from other than [facility] providers in the past year (date may be approximate).     Assessment:   This is a routine wellness examination for Myrlene.  Hearing/Vision screen Hearing Screening - Comments:: Some hearing difficulty. No hearing aids. Vision Screening - Comments:: Wears rx glasses - up to date with routine eye exams with [facility]  [place]   Dietary issues and exercise activities discussed: Current Exercise Habits: Home exercise routine, Type of exercise: walking;Other - see comments (exercise videos), Time (Minutes): 30, Frequency (Times/Week): 5, Weekly Exercise (Minutes/Week): 150, Intensity: Moderate, Exercise limited by: None identified   Goals Addressed             This Visit's Progress    My goal is to stay healthy.        Depression Screen    07/21/2022    2:54 PM 04/28/2022    1:53 PM 07/05/2021    6:05 PM 03/12/2021    6:10 AM 11/10/2020    2:07 PM 03/08/2020    3:10 PM 07/08/2019    2:00 PM  PHQ 2/9 Scores  PHQ - 2 Score 0 0 0 0 0 0 0  PHQ- 9 Score  $Remo'6  5  2     'JtEnF$ Fall Risk    07/21/2022    2:54 PM 04/28/2022    1:54 PM 07/05/2021    6:05 PM 03/10/2021   10:41 AM 11/10/2020    2:07 PM  Fall Risk   Falls in the past year? 0 1 0 0 0  Number falls in past yr: 0 0 0 0   Injury with Fall? 0 0 0 0   Risk for fall due to : No Fall Risks    No Fall Risks  Follow up Falls prevention discussed    Falls  evaluation completed    FALL RISK PREVENTION PERTAINING TO THE HOME:  Any stairs in or around the home? Yes  If so, are there any without handrails? No  Home free of loose throw rugs in walkways, pet beds, electrical cords, etc? Yes  Adequate lighting in your home to reduce risk of falls? Yes   ASSISTIVE DEVICES UTILIZED TO PREVENT FALLS:  Life alert? No  Use of a cane, walker or w/c? No  Grab bars in the bathroom? Yes  Shower chair or bench in shower? Yes  Elevated toilet seat or a handicapped toilet? Yes   TIMED UP AND GO:  Was the test performed? No . Phone Visit   Cognitive Function:        07/21/2022    3:06 PM 07/05/2021    6:09 PM  6CIT Screen  What Year? 0 points 0 points  What month? 0 points 0 points  What time? 0 points 0 points  Count back from 20 0 points 0 points  Months in reverse 2 points 2 points  Repeat phrase 0 points 10 points  Total Score 2 points 12 points     Immunizations Immunization History  Administered Date(s) Administered   Fluad Quad(Freehling Dose 65+) 07/06/2020, 06/28/2021   Influenza Whole 10/16/2004, 08/20/2006, 08/22/2007, 08/11/2008, 07/20/2010, 06/28/2011   Influenza,inj,Quad PF,6+ Mos 07/22/2014, 08/27/2015, 09/11/2016, 08/14/2017, 06/20/2018, 06/04/2019   Influenza,inj,quad, With Preservative 07/17/2019   Influenza-Unspecified 09/15/2013   Moderna Sars-Covid-2 Vaccination 12/14/2019, 12/26/2019, 01/17/2020, 09/30/2020   PNEUMOCOCCAL CONJUGATE-20 04/28/2022   Pneumococcal Polysaccharide-23 03/21/2010, 07/06/2020   Td 03/21/2010   Tdap 03/21/2010   Zoster Recombinat (Shingrix) 07/08/2019, 07/08/2019, 09/02/2019    TDAP status: Due, Education has been provided regarding the importance of this vaccine. Advised may receive this vaccine at local pharmacy or Health Dept. Aware to provide a copy of the vaccination record if obtained from local pharmacy or Health Dept. Verbalized acceptance and understanding.  Flu Vaccine status: Due, Education has been provided regarding the importance of this vaccine. Advised may receive this vaccine at local pharmacy or Health Dept. Aware to provide a copy of the vaccination record if obtained from local pharmacy or Health Dept. Verbalized acceptance and understanding.  Pneumococcal vaccine status: Up to date  Covid-19 vaccine status: Completed vaccines  Qualifies for Shingles Vaccine? Yes   Zostavax completed No   Shingrix Completed?: Yes  Screening Tests Health Maintenance  Topic Date Due   TETANUS/TDAP  03/21/2020   COVID-19 Vaccine (5 - Moderna risk series) 11/25/2020   OPHTHALMOLOGY EXAM  11/11/2021   COLONOSCOPY (Pts 45-49yrs Insurance coverage will need to be confirmed)  04/09/2022   INFLUENZA VACCINE  05/16/2022   HEMOGLOBIN A1C  10/29/2022   Diabetic kidney evaluation - GFR measurement  04/29/2023   Diabetic kidney evaluation - Urine ACR  04/29/2023   FOOT EXAM  04/29/2023    MAMMOGRAM  09/03/2023   Pneumonia Vaccine 97+ Years old  Completed   DEXA SCAN  Completed   Hepatitis C Screening  Completed   Zoster Vaccines- Shingrix  Completed   HPV VACCINES  Aged Out    Health Maintenance  Health Maintenance Due  Topic Date Due   TETANUS/TDAP  03/21/2020   COVID-19 Vaccine (5 - Moderna risk series) 11/25/2020   OPHTHALMOLOGY EXAM  11/11/2021   COLONOSCOPY (Pts 45-77yrs Insurance coverage will need to be confirmed)  04/09/2022   INFLUENZA VACCINE  05/16/2022    Colorectal cancer screening: Type of screening: Cologuard. Completed 05/31/2022. Repeat every 3 years  Mammogram status: Completed 07/2022. Repeat every year  Bone Density status: Completed 04/12/2020. Results reflect: Bone density results: NORMAL. Repeat every 5 years.  Lung Cancer Screening: (Low Dose CT Chest recommended if Age 63-80 years, 30 pack-year currently smoking OR have quit w/in 15years.) does not qualify.   Lung Cancer Screening Referral: no  Additional Screening:  Hepatitis C Screening: does qualify; Completed 09/12/2017  Vision Screening: Recommended annual ophthalmology exams for early detection of glaucoma and other disorders of the eye. Is the patient up to date with their annual eye exam?  Yes  Who is the provider or what is the name of the office in which the patient attends annual eye exams? [facility] If pt is not established with a provider, would they like to be referred to a provider to establish care? No .   Dental Screening: Recommended annual dental exams for proper oral hygiene  Community Resource Referral / Chronic Care Management: CRR required this visit?  No   CCM required this visit?  No      Plan:     I have personally reviewed and noted the following in the patient's chart:   Medical and social history Use of alcohol, tobacco or illicit drugs  Current medications and supplements including opioid prescriptions. Patient is not currently taking  opioid prescriptions. Functional ability and status Nutritional status Physical activity Advanced directives List of other physicians Hospitalizations, surgeries, and ER visits in previous 12 months Vitals Screenings to include cognitive, depression, and falls Referrals and appointments  In addition, I have reviewed and discussed with patient certain preventive protocols, quality metrics, and best practice recommendations. A written personalized care plan for preventive services as well as general preventive health recommendations were provided to patient.     Sheral Flow, LPN   [id number]   Nurse Notes: N/A

## 2022-07-25 ENCOUNTER — Telehealth: Payer: Self-pay | Admitting: Internal Medicine

## 2022-07-25 ENCOUNTER — Other Ambulatory Visit: Payer: Self-pay | Admitting: Internal Medicine

## 2022-07-25 NOTE — Telephone Encounter (Signed)
Please advise 

## 2022-07-25 NOTE — Telephone Encounter (Signed)
Patient called in stating that her pharmacy called her and that they sent a request for the following medications to be refill: albuterol (VENTOLIN HFA) 108 (90 Base) MCG/ACT inhaler & ALPRAZolam (XANAX) 1 MG tablet.  She wants to make sure those medications are refilled.  Pt's pharmacy is [facility] [phone]

## 2022-07-26 NOTE — Telephone Encounter (Signed)
Please advise 

## 2022-07-27 NOTE — Telephone Encounter (Signed)
This is duplicative

## 2022-07-31 DIAGNOSIS — L438 Other lichen planus: Secondary | ICD-10-CM | POA: Diagnosis not present

## 2022-07-31 DIAGNOSIS — L439 Lichen planus, unspecified: Secondary | ICD-10-CM | POA: Diagnosis not present

## 2022-07-31 DIAGNOSIS — Z5181 Encounter for therapeutic drug level monitoring: Secondary | ICD-10-CM | POA: Diagnosis not present

## 2022-07-31 DIAGNOSIS — Z79631 Long term (current) use of antimetabolite agent: Secondary | ICD-10-CM | POA: Diagnosis not present

## 2022-07-31 DIAGNOSIS — Z79899 Other long term (current) drug therapy: Secondary | ICD-10-CM | POA: Diagnosis not present

## 2022-07-31 DIAGNOSIS — B370 Candidal stomatitis: Secondary | ICD-10-CM | POA: Diagnosis not present

## 2022-08-02 ENCOUNTER — Other Ambulatory Visit (HOSPITAL_COMMUNITY): Payer: Self-pay | Admitting: Internal Medicine

## 2022-08-02 DIAGNOSIS — Z1231 Encounter for screening mammogram for malignant neoplasm of breast: Secondary | ICD-10-CM

## 2022-08-09 DIAGNOSIS — K219 Gastro-esophageal reflux disease without esophagitis: Secondary | ICD-10-CM | POA: Diagnosis not present

## 2022-08-09 DIAGNOSIS — R490 Dysphonia: Secondary | ICD-10-CM | POA: Diagnosis not present

## 2022-08-14 ENCOUNTER — Other Ambulatory Visit: Payer: Self-pay | Admitting: Internal Medicine

## 2022-09-11 ENCOUNTER — Ambulatory Visit (HOSPITAL_COMMUNITY)
Admission: RE | Admit: 2022-09-11 | Discharge: 2022-09-11 | Disposition: A | Discharge status: Home / Self Care | Payer: Medicare Other | Source: Ambulatory Visit | Attending: Internal Medicine | Admitting: Internal Medicine

## 2022-09-11 DIAGNOSIS — Z1231 Encounter for screening mammogram for malignant neoplasm of breast: Secondary | ICD-10-CM | POA: Diagnosis not present

## 2022-10-12 ENCOUNTER — Other Ambulatory Visit: Payer: Self-pay | Admitting: Internal Medicine

## 2022-10-30 ENCOUNTER — Encounter: Payer: Self-pay | Admitting: Internal Medicine

## 2022-10-30 ENCOUNTER — Ambulatory Visit (INDEPENDENT_AMBULATORY_CARE_PROVIDER_SITE_OTHER): Payer: 59 | Admitting: Internal Medicine

## 2022-10-30 VITALS — BP 120/100 | HR 69 | Temp 97.9°F | Ht 61.0 in | Wt 227.0 lb

## 2022-10-30 DIAGNOSIS — E118 Type 2 diabetes mellitus with unspecified complications: Secondary | ICD-10-CM

## 2022-10-30 DIAGNOSIS — R5383 Other fatigue: Secondary | ICD-10-CM | POA: Diagnosis not present

## 2022-10-30 DIAGNOSIS — K5909 Other constipation: Secondary | ICD-10-CM | POA: Diagnosis not present

## 2022-10-30 DIAGNOSIS — I10 Essential (primary) hypertension: Secondary | ICD-10-CM | POA: Diagnosis not present

## 2022-10-30 DIAGNOSIS — Z23 Encounter for immunization: Secondary | ICD-10-CM | POA: Diagnosis not present

## 2022-10-30 LAB — POCT GLYCOSYLATED HEMOGLOBIN (HGB A1C): HbA1c POC (<> result, manual entry): 6.3 % (ref 4.0–5.6)

## 2022-10-30 MED ORDER — SPIRONOLACTONE 25 MG PO TABS: 25.0000 mg | ORAL_TABLET | Freq: Every morning | ORAL | 3 refills | Status: DC

## 2022-10-30 MED ORDER — OMEPRAZOLE 20 MG PO CPDR: 20.0000 mg | DELAYED_RELEASE_CAPSULE | Freq: Every day | ORAL | 3 refills | Status: DC

## 2022-10-30 MED ORDER — POLYETHYLENE GLYCOL 3350 17 GM/SCOOP PO POWD: ORAL | 6 refills | Status: DC

## 2022-10-30 MED ORDER — CYANOCOBALAMIN 1000 MCG/ML IJ SOLN
1000.0000 ug | Freq: Once | INTRAMUSCULAR | Status: AC
  Administered 2022-10-30: 1000 ug via INTRAMUSCULAR

## 2022-10-30 MED ORDER — AMLODIPINE BESYLATE 10 MG PO TABS: 10.0000 mg | ORAL_TABLET | Freq: Every morning | ORAL | 3 refills | Status: DC

## 2022-10-30 MED ORDER — FUROSEMIDE 20 MG PO TABS: 20.0000 mg | ORAL_TABLET | Freq: Every day | ORAL | 3 refills | Status: DC | PRN

## 2022-10-30 MED ORDER — METFORMIN HCL 500 MG PO TABS: 500.0000 mg | ORAL_TABLET | Freq: Two times a day (BID) | ORAL | 3 refills | Status: DC

## 2022-10-30 NOTE — Assessment & Plan Note (Signed)
Refilled miralax as she has previously gotten good results with daily usage of this.

## 2022-10-30 NOTE — Assessment & Plan Note (Signed)
POC HgA1c done at 6.3. Taking metformin 500 mg BID and refilled. Taking ARB and statin. Adjust as needed.

## 2022-10-30 NOTE — Patient Instructions (Signed)
Your EKG looks normal and not changed since 2019. We have given you the flu and the B12 shot today. Let us know if getting back on the fluid pill does not help how you are feeling.

## 2022-10-30 NOTE — Assessment & Plan Note (Signed)
BP above goal due to being out of lasix several weeks. EKG done today which is unchanged from prior. Will keep lasix 20 mg daily, spironolactone 25 mg daily and losartan 50 mg daily and amlodipine 10 mg daily.

## 2022-10-30 NOTE — Progress Notes (Signed)
   Subjective:   Patient ID: [name] [name], female    DOB: 04-Apr-1956, 67 y.o.   MRN: [id number]  HPI The patient is a 67 YO female coming in for follow up.  Review of Systems  Constitutional:  Positive for activity change.  HENT: Negative.    Eyes: Negative.   Respiratory:  Negative for cough, chest tightness and shortness of breath.   Cardiovascular:  Positive for leg swelling. Negative for chest pain and palpitations.  Gastrointestinal:  Negative for abdominal distention, abdominal pain, constipation, diarrhea, nausea and vomiting.  Musculoskeletal:  Positive for arthralgias and myalgias.  Skin: Negative.   Neurological: Negative.   Psychiatric/Behavioral: Negative.      Objective:  Physical Exam Constitutional:      Appearance: She is well-developed.  HENT:     Head: Normocephalic and atraumatic.  Cardiovascular:     Rate and Rhythm: Normal rate and regular rhythm.  Pulmonary:     Effort: Pulmonary effort is normal. No respiratory distress.     Breath sounds: Normal breath sounds. No wheezing or rales.  Abdominal:     General: Bowel sounds are normal. There is no distension.     Palpations: Abdomen is soft.     Tenderness: There is no abdominal tenderness. There is no rebound.  Musculoskeletal:     Cervical back: Normal range of motion.     Right lower leg: Edema present.     Left lower leg: Edema present.     Comments: 1+ bilateral swelling  Skin:    General: Skin is warm and dry.  Neurological:     Mental Status: She is alert and oriented to person, place, and time.     Coordination: Coordination normal.    EKG: Rate 65, axis normal, interval normal, sinus, no st or t wave changes, no significant change compared to prior 2019  Vitals:   10/30/22 1358 10/30/22 1411  BP: (!) 120/100 (!) 120/100  Pulse: 69   Temp: 97.9 F (36.6 C)   TempSrc: Oral   SpO2: 98%   Weight: 227 lb (103 kg)   Height: '5\' 1"'$  (1.549 m)     Assessment & Plan:  B12 and flu shot  given at visit

## 2022-10-30 NOTE — Assessment & Plan Note (Signed)
Prior B12 deficiency and recent fatigue. Given B12 shot and if not resolved will check B12 with next set of labs.

## 2022-11-06 DIAGNOSIS — L439 Lichen planus, unspecified: Secondary | ICD-10-CM | POA: Diagnosis not present

## 2022-11-06 DIAGNOSIS — Z79899 Other long term (current) drug therapy: Secondary | ICD-10-CM | POA: Diagnosis not present

## 2022-11-06 DIAGNOSIS — Z5181 Encounter for therapeutic drug level monitoring: Secondary | ICD-10-CM | POA: Diagnosis not present

## 2022-11-06 DIAGNOSIS — B370 Candidal stomatitis: Secondary | ICD-10-CM | POA: Diagnosis not present

## 2022-11-06 DIAGNOSIS — L438 Other lichen planus: Secondary | ICD-10-CM | POA: Diagnosis not present

## 2022-11-13 ENCOUNTER — Telehealth (INDEPENDENT_AMBULATORY_CARE_PROVIDER_SITE_OTHER): Payer: 59 | Admitting: Internal Medicine

## 2022-11-13 ENCOUNTER — Telehealth: Payer: Self-pay | Admitting: Internal Medicine

## 2022-11-13 DIAGNOSIS — E118 Type 2 diabetes mellitus with unspecified complications: Secondary | ICD-10-CM | POA: Diagnosis not present

## 2022-11-13 DIAGNOSIS — U071 COVID-19: Secondary | ICD-10-CM

## 2022-11-13 DIAGNOSIS — E785 Hyperlipidemia, unspecified: Secondary | ICD-10-CM | POA: Diagnosis not present

## 2022-11-13 DIAGNOSIS — E1169 Type 2 diabetes mellitus with other specified complication: Secondary | ICD-10-CM

## 2022-11-13 MED ORDER — NIRMATRELVIR/RITONAVIR (PAXLOVID)TABLET: 3.0000 | ORAL_TABLET | Freq: Two times a day (BID) | ORAL | 0 refills | Status: AC

## 2022-11-13 MED ORDER — HYDROCODONE BIT-HOMATROP MBR 5-1.5 MG/5ML PO SOLN: 5.0000 mL | Freq: Four times a day (QID) | ORAL | 0 refills | Status: AC | PRN

## 2022-11-13 NOTE — Progress Notes (Signed)
Patient ID: [name] [name], female   DOB: 06-03-1956, 67 y.o.   MRN: [id number]  Virtual Visit via Video Note  I connected with Marketia B Stick on 11/14/22 at  3:00 PM EST by a video enabled telemedicine application and verified that I am speaking with the correct person using two identifiers.  Location of all participants today Patient: at home Provider: at office   I discussed the limitations of evaluation and management by telemedicine and the availability of in person appointments. The patient expressed understanding and agreed to proceed.  History of Present Illness: Here  to f/u with c/o acute onset yesterday covid symptoms with + home testing, Has head congestion, feverish, mild ST, non prod cough and Pt denies chest pain, increased sob or doe, wheezing, orthopnea, PND, increased LE swelling, palpitations, dizziness or syncope.  Denies worsening reflux, abd pain, dysphagia, n/v, bowel change or blood.   Pt denies polydipsia, polyuria, or new focal neuro s/s.    Past Medical History:  Diagnosis Date   Anxiety    Cancer ([place])    Chronic abdominal pain    Constipation    Depression    Diabetes mellitus, type 2 (HCC)    Essential hypertension, benign    GERD (gastroesophageal reflux disease)    Glaucoma    Last eye exam 9/08   Hiatal hernia    HSV-2 seropositive    Iron deficiency anemia    Low back pain    Mixed hyperlipidemia    Osteoarthritis    Positive H. pylori test    Schatzki's ring    TIA (transient ischemic attack)    6/10   Varicose veins    Past Surgical History:  Procedure Laterality Date   ABDOMINAL HYSTERECTOMY  08/01/2012   total and BSO due uterine cancer   CHOLECYSTECTOMY  Dec 26, 2010   COLONOSCOPY  01/2006   DILATION AND CURETTAGE OF UTERUS  06/12/12   ESOPHAGOGASTRODUODENOSCOPY     Esophageal dilation 2007   Lumbar discetomy  10/01   Dr. Larose Hires    SPINE SURGERY     TUBAL LIGATION  9371   UMBILICAL HERNIA REPAIR  12/26/2010    reports that she has  never smoked. She has never used smokeless tobacco. She reports that she does not drink alcohol and does not use drugs. family history includes Arrhythmia in her daughter; Diabetes in her father and mother; Healthy in her daughter and sister; Heart disease in her daughter; Hypertension in her father; Scoliosis in her daughter. Allergies  Allergen Reactions   Contrast Media [Iodinated Contrast Media] Hives   Lisinopril     REACTION: cough   Buspar [Buspirone Hcl] Palpitations   Current Outpatient Medications on File Prior to Visit  Medication Sig Dispense Refill   acitretin (SORIATANE) 25 MG capsule Take 25 mg by mouth daily.      albuterol (VENTOLIN HFA) 108 (90 Base) MCG/ACT inhaler INHALE 2 PUFFS INTO THE LUNGS EVERY 4 HOURS AS NEEDED FOR WHEEZING OR SHORTNESS OF BREATH 42.5 g 0   Alcohol Swabs (B-D SINGLE USE SWABS REGULAR) PADS 200 packets by Other route 2 (two) times daily. 200 each 3   ALPRAZolam (XANAX) 1 MG tablet TAKE 1 TABLET(1 MG) BY MOUTH AT BEDTIME AS NEEDED FOR ANXIETY 90 tablet 1   amLODipine (NORVASC) 10 MG tablet Take 1 tablet (10 mg total) by mouth every morning. 100 tablet 3   aspirin 325 MG tablet Take 325 mg by mouth every morning.  Blood Glucose Calibration (ACCU-CHEK SMARTVIEW CONTROL) LIQD USE AS DIRECTED 1 each 4   Blood Glucose Monitoring Suppl (ACCU-CHEK NANO SMARTVIEW) w/Device KIT Use as directed to monitor FSBS 2x daily. Dx: E11.69 1 kit 0   clobetasol ointment (TEMOVATE) 0.05 % APPLY IN SORE AREAS OF MOUTH BID. NOT ON FACE/GROIN  2   co-enzyme Q-10 30 MG capsule Take 1 capsule (30 mg total) by mouth 3 (three) times daily.     fluocinonide ointment (LIDEX) 0.05 % Apply to affected areas of body twice daily. Never on face/groin     fluticasone (FLONASE) 50 MCG/ACT nasal spray SHAKE LIQUID AND USE 2 SPRAYS IN EACH NOSTRIL DAILY 48 g 3   folic acid (FOLVITE) 1 MG tablet Take 1 mg by mouth daily.     furosemide (LASIX) 20 MG tablet Take 1 tablet (20 mg total) by  mouth daily as needed. 90 tablet 3   glucose blood (ACCU-CHEK SMARTVIEW) test strip Use as directed to monitor FSBS daily. Dx: E11.69 100 strip 11   Lancet Devices (ADJUSTABLE LANCING DEVICE) MISC TEST FASTING BLOOD SUGAR TWICE PER DAY DX; E11.9 1 each 0   Lancets MISC Use as directed to monitor FSBS 2x daily. Dx: E11.9. 100 each 1   lidocaine (XYLOCAINE) 2 % solution Use as directed 5-10 mLs in the mouth or throat 2 (two) times daily. 100 mL 0   LINZESS 145 MCG CAPS capsule TAKE 1 CAPSULE (145 MCG TOTAL) BY MOUTH DAILY BEFORE BREAKFAST. 90 capsule 3   losartan (COZAAR) 50 MG tablet TAKE 1 TABLET(50 MG) BY MOUTH DAILY 90 tablet 3   meclizine (ANTIVERT) 12.5 MG tablet Take 1 tablet (12.5 mg total) by mouth 3 (three) times daily as needed for dizziness. 20 tablet 0   metFORMIN (GLUCOPHAGE) 500 MG tablet Take 1 tablet (500 mg total) by mouth 2 (two) times daily with a meal. 200 tablet 3   methocarbamol (ROBAXIN) 500 MG tablet Take 1 tablet (500 mg total) by mouth every 6 (six) hours as needed for muscle spasms. 30 tablet 1   methotrexate (RHEUMATREX) 2.5 MG tablet Take 3 tablets (7.5 mg total) by mouth once a week. Caution:Chemotherapy. Protect from light. 4 tablet 0   Multiple Vitamins-Minerals (CENTRUM ADULTS PO) Take by mouth.     naproxen (NAPROSYN) 500 MG tablet TAKE 1 TABLET BY MOUTH  TWICE DAILY WITH A MEAL 180 tablet 3   omeprazole (PRILOSEC) 20 MG capsule Take 1 capsule (20 mg total) by mouth daily. 90 capsule 3   polyethylene glycol powder (GLYCOLAX/MIRALAX) 17 GM/SCOOP powder MIX 1 CAPFUL (17GM) IN 8 OUNCES OF WATER AND DRINK EVERY DAY 3350 g 6   rosuvastatin (CRESTOR) 10 MG tablet TAKE 1 TABLET(10 MG) BY MOUTH AT BEDTIME 90 tablet 3   spironolactone (ALDACTONE) 25 MG tablet Take 1 tablet (25 mg total) by mouth every morning. 100 tablet 3   tacrolimus (PROGRAF) 1 MG capsule Take by mouth.     traMADol (ULTRAM) 50 MG tablet Take 1 tablet (50 mg total) by mouth daily as needed. 15 tablet 3    triamcinolone cream (KENALOG) 0.1 % APPLY TOPICALLY TO VULVA 2 TIMES DAILY 45 g 3   vitamin B-12 (CYANOCOBALAMIN) 1000 MCG tablet Take 1 tablet (1,000 mcg total) by mouth daily.     No current facility-administered medications on file prior to visit.    Observations/Objective: Alert, NAD, appropriate mood and affect, resps normal, cn 2-12 intact, moves all 4s, no visible rash or swelling Lab Results  Component Value Date   WBC 5.2 04/28/2022   HGB 11.4 (L) 04/28/2022   HCT 35.6 (L) 04/28/2022   PLT 284.0 04/28/2022   GLUCOSE 103 (H) 04/28/2022   CHOL 116 04/28/2022   TRIG 90.0 04/28/2022   HDL 42.90 04/28/2022   LDLCALC 55 04/28/2022   ALT 9 04/28/2022   AST 18 04/28/2022   NA 139 04/28/2022   K 4.3 04/28/2022   CL 102 04/28/2022   CREATININE 0.93 04/28/2022   BUN 15 04/28/2022   CO2 31 04/28/2022   TSH 1.34 01/09/2017   INR 0.97 01/22/2012   HGBA1C 6.3 10/30/2022   MICROALBUR 3.7 (H) 04/28/2022   Assessment and Plan: See notes  Follow Up Instructions: See notes   I discussed the assessment and treatment plan with the patient. The patient was provided an opportunity to ask questions and all were answered. The patient agreed with the plan and demonstrated an understanding of the instructions.   The patient was advised to call back or seek an in-person evaluation if the symptoms worsen or if the condition fails to improve as anticipated.   Cathlean Cower, MD

## 2022-11-13 NOTE — Telephone Encounter (Signed)
If she desires antiviral ok for virtual visit with anyone.

## 2022-11-13 NOTE — Patient Instructions (Signed)
Please take all new medication as prescribed 

## 2022-11-13 NOTE — Telephone Encounter (Signed)
Patient has been taken care of by one of our providers in office.

## 2022-11-13 NOTE — Telephone Encounter (Signed)
Patient called stating that she tested positive for COVID on yesterday, and that she has been having symptoms since Friday. She would like for some medicine to be called in for her. She can be reached on her cell [phone] or home [phone] if there are any questions or concerns.

## 2022-11-14 ENCOUNTER — Encounter: Payer: Self-pay | Admitting: Internal Medicine

## 2022-11-14 DIAGNOSIS — U071 COVID-19: Secondary | ICD-10-CM | POA: Insufficient documentation

## 2022-11-14 NOTE — Assessment & Plan Note (Signed)
Lab Results  Component Value Date   HGBA1C 6.3 10/30/2022   Stable, pt to continue current medical treatment metfomrin 500 bid

## 2022-11-14 NOTE — Assessment & Plan Note (Signed)
Pt to hold crestor when taking the paxlovid, then restart

## 2022-11-14 NOTE — Assessment & Plan Note (Deleted)
Mild to mod, for antibx course paxlovid, cough med prn,,  to f/u any worsening symptoms or concerns

## 2022-11-14 NOTE — Assessment & Plan Note (Signed)
Mild to mod, for antibx course paxlovid, cough med prn,,  to f/u any worsening symptoms or concerns

## 2022-11-22 ENCOUNTER — Other Ambulatory Visit: Payer: Self-pay | Admitting: Internal Medicine

## 2022-11-23 NOTE — Telephone Encounter (Signed)
Should not be due until April is she out? Filled Jan 2024 for 90 day fill

## 2022-11-28 ENCOUNTER — Other Ambulatory Visit: Payer: Self-pay

## 2022-11-28 NOTE — Telephone Encounter (Signed)
Called pt inform her MD gave her rx on 10/30/22. Was sent to [facility] and she has 6 refills on that script. Pt states she will contact [facility]

## 2022-11-28 NOTE — Telephone Encounter (Signed)
It was refilled in Jan she should call pharmacy. I still see on her active med list

## 2022-12-05 NOTE — Progress Notes (Unsigned)
Subjective:    Patient ID: [name] [name], female    DOB: [birth year], 67 y.o.   MRN: [id number]      HPI [name] [name] is here for No chief complaint on file.   She is here for an acute visit for cold symptoms.   Her symptoms started   She is experiencing   She has tried taking       Medications and allergies reviewed with patient and updated if appropriate.  Current Outpatient Medications on File Prior to Visit  Medication Sig Dispense Refill   acitretin (SORIATANE) 25 MG capsule Take 25 mg by mouth daily.      albuterol (VENTOLIN HFA) 108 (90 Base) MCG/ACT inhaler INHALE 2 PUFFS INTO THE LUNGS EVERY 4 HOURS AS NEEDED FOR WHEEZING OR SHORTNESS OF BREATH 42.5 g 0   Alcohol Swabs (B-D SINGLE USE SWABS REGULAR) PADS 200 packets by Other route 2 (two) times daily. 200 each 3   ALPRAZolam (XANAX) 1 MG tablet TAKE 1 TABLET(1 MG) BY MOUTH AT BEDTIME AS NEEDED FOR ANXIETY 90 tablet 1   amLODipine (NORVASC) 10 MG tablet Take 1 tablet (10 mg total) by mouth every morning. 100 tablet 3   aspirin 325 MG tablet Take 325 mg by mouth every morning.     Blood Glucose Calibration (ACCU-CHEK SMARTVIEW CONTROL) LIQD USE AS DIRECTED 1 each 4   Blood Glucose Monitoring Suppl (ACCU-CHEK NANO SMARTVIEW) w/Device KIT Use as directed to monitor FSBS 2x daily. Dx: E11.69 1 kit 0   clobetasol ointment (TEMOVATE) 0.05 % APPLY IN SORE AREAS OF MOUTH BID. NOT ON FACE/GROIN  2   clotrimazole (MYCELEX) 10 MG troche Take 1 troche three times a week     co-enzyme Q-10 30 MG capsule Take 1 capsule (30 mg total) by mouth 3 (three) times daily.     fluocinonide ointment (LIDEX) 0.05 % Apply to affected areas of body twice daily. Never on face/groin     fluticasone (FLONASE) 50 MCG/ACT nasal spray SHAKE LIQUID AND USE 2 SPRAYS IN EACH NOSTRIL DAILY 48 g 3   folic acid (FOLVITE) 1 MG tablet Take 1 mg by mouth daily.     furosemide (LASIX) 20 MG tablet Take 1 tablet (20 mg total) by mouth daily as needed. 90 tablet  3   glucose blood (ACCU-CHEK SMARTVIEW) test strip Use as directed to monitor FSBS daily. Dx: E11.69 100 strip 11   Lancet Devices (ADJUSTABLE LANCING DEVICE) MISC TEST FASTING BLOOD SUGAR TWICE PER DAY DX; E11.9 1 each 0   Lancets MISC Use as directed to monitor FSBS 2x daily. Dx: E11.9. 100 each 1   lidocaine (XYLOCAINE) 2 % solution Use as directed 5-10 mLs in the mouth or throat 2 (two) times daily. 100 mL 0   LINZESS 145 MCG CAPS capsule TAKE 1 CAPSULE (145 MCG TOTAL) BY MOUTH DAILY BEFORE BREAKFAST. 90 capsule 3   losartan (COZAAR) 50 MG tablet TAKE 1 TABLET(50 MG) BY MOUTH DAILY 90 tablet 3   meclizine (ANTIVERT) 12.5 MG tablet Take 1 tablet (12.5 mg total) by mouth 3 (three) times daily as needed for dizziness. 20 tablet 0   metFORMIN (GLUCOPHAGE) 500 MG tablet Take 1 tablet (500 mg total) by mouth 2 (two) times daily with a meal. 200 tablet 3   methocarbamol (ROBAXIN) 500 MG tablet Take 1 tablet (500 mg total) by mouth every 6 (six) hours as needed for muscle spasms. 30 tablet 1   methotrexate (RHEUMATREX) 2.5 MG tablet Take  3 tablets (7.5 mg total) by mouth once a week. Caution:Chemotherapy. Protect from light. 4 tablet 0   Multiple Vitamins-Minerals (CENTRUM ADULTS PO) Take by mouth.     naproxen (NAPROSYN) 500 MG tablet TAKE 1 TABLET BY MOUTH  TWICE DAILY WITH A MEAL 180 tablet 3   ofloxacin (FLOXIN) 0.3 % OTIC solution INSTILL 5 DROP INTO AFFECTED EAR TWICE DAILY AS NEEDED FOR DRAINAGE     omeprazole (PRILOSEC) 20 MG capsule Take 1 capsule (20 mg total) by mouth daily. 90 capsule 3   polyethylene glycol powder (GLYCOLAX/MIRALAX) 17 GM/SCOOP powder MIX 1 CAPFUL (17GM) IN 8 OUNCES OF WATER AND DRINK EVERY DAY 3350 g 6   rosuvastatin (CRESTOR) 10 MG tablet TAKE 1 TABLET(10 MG) BY MOUTH AT BEDTIME 90 tablet 3   spironolactone (ALDACTONE) 25 MG tablet Take 1 tablet (25 mg total) by mouth every morning. 100 tablet 3   tacrolimus (PROGRAF) 1 MG capsule Take by mouth.     traMADol (ULTRAM) 50  MG tablet Take 1 tablet (50 mg total) by mouth daily as needed. 15 tablet 3   triamcinolone cream (KENALOG) 0.1 % APPLY TOPICALLY TO VULVA 2 TIMES DAILY 45 g 3   vitamin B-12 (CYANOCOBALAMIN) 1000 MCG tablet Take 1 tablet (1,000 mcg total) by mouth daily.     No current facility-administered medications on file prior to visit.    Review of Systems     Objective:  There were no vitals filed for this visit. BP Readings from Last 3 Encounters:  10/30/22 (!) 120/100  04/28/22 126/70  10/28/21 130/62   Wt Readings from Last 3 Encounters:  10/30/22 227 lb (103 kg)  07/21/22 225 lb (102.1 kg)  04/28/22 226 lb (102.5 kg)   There is no height or weight on file to calculate BMI.    Physical Exam         Assessment & Plan:    See Problem List for Assessment and Plan of chronic medical problems.

## 2022-12-06 ENCOUNTER — Ambulatory Visit (INDEPENDENT_AMBULATORY_CARE_PROVIDER_SITE_OTHER): Payer: 59 | Admitting: Internal Medicine

## 2022-12-06 ENCOUNTER — Encounter: Payer: Self-pay | Admitting: Internal Medicine

## 2022-12-06 VITALS — BP 110/62 | HR 75 | Temp 98.1°F | Ht 61.0 in | Wt 228.0 lb

## 2022-12-06 DIAGNOSIS — E119 Type 2 diabetes mellitus without complications: Secondary | ICD-10-CM | POA: Diagnosis not present

## 2022-12-06 DIAGNOSIS — I10 Essential (primary) hypertension: Secondary | ICD-10-CM

## 2022-12-06 DIAGNOSIS — L439 Lichen planus, unspecified: Secondary | ICD-10-CM

## 2022-12-06 DIAGNOSIS — J209 Acute bronchitis, unspecified: Secondary | ICD-10-CM

## 2022-12-06 MED ORDER — PROMETHAZINE-DM 6.25-15 MG/5ML PO SYRP: 5.0000 mL | ORAL_SOLUTION | Freq: Four times a day (QID) | ORAL | 0 refills | Status: DC | PRN

## 2022-12-06 MED ORDER — CEFDINIR 300 MG PO CAPS: 300.0000 mg | ORAL_CAPSULE | Freq: Two times a day (BID) | ORAL | 0 refills | Status: DC

## 2022-12-06 NOTE — Patient Instructions (Addendum)
      Medications changes include :  antibiotic and cough syrup      Return if symptoms worsen or fail to improve.

## 2022-12-13 ENCOUNTER — Other Ambulatory Visit: Payer: Self-pay | Admitting: Internal Medicine

## 2022-12-25 ENCOUNTER — Telehealth: Payer: Self-pay | Admitting: Internal Medicine

## 2022-12-25 NOTE — Telephone Encounter (Signed)
PT visits today in regards to ongoing symptoms. PT was seen by Dr.Burns on 02/21 for some cough/congestion and had since finished her round of medication prescribed during that visit. PT is still experiencing these symptoms and is wondering what next course of action should be.   CB: [phone]

## 2022-12-25 NOTE — Telephone Encounter (Signed)
Prescription Request  12/25/2022  LOV: 10/30/2022  What is the name of the medication or equipment? cefdinir (OMNICEF) 300 MG capsule   Have you contacted your pharmacy to request a refill? Yes   Which pharmacy would you like this sent to?   [id number] - , [address] [place] [address] Phone: [phone] Fax: [phone]    Patient notified that their request is being sent to the clinical staff for review and that they should receive a response within 2 business days.   Please advise at Mobile [phone] (mobile)

## 2022-12-26 MED ORDER — CEFDINIR 300 MG PO CAPS: 300.0000 mg | ORAL_CAPSULE | Freq: Two times a day (BID) | ORAL | 0 refills | Status: DC

## 2022-12-26 NOTE — Telephone Encounter (Signed)
Refill has been sent in.  

## 2022-12-27 NOTE — Telephone Encounter (Signed)
Pt has appt 01/02/23.Marland KitchenJohny [name]

## 2023-01-02 ENCOUNTER — Encounter: Payer: Self-pay | Admitting: Internal Medicine

## 2023-01-02 ENCOUNTER — Ambulatory Visit (INDEPENDENT_AMBULATORY_CARE_PROVIDER_SITE_OTHER): Payer: 59

## 2023-01-02 ENCOUNTER — Ambulatory Visit (INDEPENDENT_AMBULATORY_CARE_PROVIDER_SITE_OTHER): Payer: 59 | Admitting: Internal Medicine

## 2023-01-02 VITALS — BP 136/84 | HR 70 | Temp 97.9°F | Ht 61.0 in | Wt 230.0 lb

## 2023-01-02 DIAGNOSIS — R052 Subacute cough: Secondary | ICD-10-CM

## 2023-01-02 DIAGNOSIS — R059 Cough, unspecified: Secondary | ICD-10-CM | POA: Diagnosis not present

## 2023-01-02 MED ORDER — PREDNISONE 20 MG PO TABS: 40.0000 mg | ORAL_TABLET | Freq: Every day | ORAL | 0 refills | Status: DC

## 2023-01-02 NOTE — Progress Notes (Signed)
   Subjective:   Patient ID: [name] [name], female    DOB: [birth year], 67 y.o.   MRN: [id number]  HPI The patient is a 67 YO female coming in for follow up. Treated for bronchitis about a month ago and not feeling better.   Review of Systems  Constitutional:  Negative for activity change, appetite change, chills, fatigue, fever and unexpected weight change.  HENT:  Positive for congestion and postnasal drip. Negative for ear discharge, ear pain, rhinorrhea, sinus pressure, sinus pain, sneezing, sore throat, tinnitus, trouble swallowing and voice change.   Eyes: Negative.   Respiratory:  Positive for cough. Negative for chest tightness, shortness of breath and wheezing.   Cardiovascular: Negative.   Gastrointestinal: Negative.   Musculoskeletal:  Negative for myalgias.  Neurological: Negative.     Objective:  Physical Exam Constitutional:      Appearance: She is well-developed.  HENT:     Head: Normocephalic and atraumatic.  Cardiovascular:     Rate and Rhythm: Normal rate and regular rhythm.  Pulmonary:     Effort: Pulmonary effort is normal. No respiratory distress.     Breath sounds: Normal breath sounds. No wheezing or rales.  Abdominal:     General: Bowel sounds are normal. There is no distension.     Palpations: Abdomen is soft.     Tenderness: There is no abdominal tenderness. There is no rebound.  Musculoskeletal:     Cervical back: Normal range of motion.  Skin:    General: Skin is warm and dry.  Neurological:     Mental Status: She is alert and oriented to person, place, and time.     Coordination: Coordination normal.     Vitals:   01/02/23 1545  BP: 136/84  Pulse: 70  Temp: 97.9 F (36.6 C)  TempSrc: Oral  SpO2: 100%  Weight: 230 lb (104.3 kg)  Height: 5\' 1"  (1.549 m)    Assessment & Plan:

## 2023-01-02 NOTE — Patient Instructions (Signed)
We will do a chest -xray to check the lungs.  We  have sent in prednisone to take 2 pills daily for 5 days to help the lungs. If there is a pneumonia we may need another antibiotic but will wait since the lungs sound good.

## 2023-01-03 ENCOUNTER — Other Ambulatory Visit: Payer: Self-pay | Admitting: Internal Medicine

## 2023-01-05 DIAGNOSIS — R052 Subacute cough: Secondary | ICD-10-CM | POA: Insufficient documentation

## 2023-01-05 NOTE — Assessment & Plan Note (Signed)
With covid-19 end of January and then cold in February she is not outside of 3 months to consider long covid. Lungs clear on exam ordered CXR to rule out atypical infection/CAP. Rx prednisone to help with inflammation in the lungs. Could take several more weeks for cough to gradually resolve.

## 2023-01-11 ENCOUNTER — Telehealth: Payer: Self-pay | Admitting: Internal Medicine

## 2023-01-11 NOTE — Telephone Encounter (Signed)
Unable to accept pt at this time as not prescribing chronic benzos.

## 2023-01-11 NOTE — Telephone Encounter (Signed)
Fine with me

## 2023-01-11 NOTE — Telephone Encounter (Signed)
Pt would like to transfer to dr banks  

## 2023-02-02 NOTE — Telephone Encounter (Signed)
Lmom for pt to call back. 

## 2023-02-02 NOTE — Telephone Encounter (Signed)
Pt is aware/mm ?

## 2023-02-09 NOTE — Telephone Encounter (Signed)
Pt said she is no longer on benzos . Please advise

## 2023-02-12 ENCOUNTER — Ambulatory Visit (INDEPENDENT_AMBULATORY_CARE_PROVIDER_SITE_OTHER): Payer: 59 | Admitting: Internal Medicine

## 2023-02-12 ENCOUNTER — Telehealth: Payer: Self-pay

## 2023-02-12 VITALS — BP 116/82 | HR 80 | Temp 98.1°F | Ht 61.0 in | Wt 230.1 lb

## 2023-02-12 DIAGNOSIS — R490 Dysphonia: Secondary | ICD-10-CM | POA: Diagnosis not present

## 2023-02-12 DIAGNOSIS — F411 Generalized anxiety disorder: Secondary | ICD-10-CM | POA: Diagnosis not present

## 2023-02-12 DIAGNOSIS — R052 Subacute cough: Secondary | ICD-10-CM | POA: Diagnosis not present

## 2023-02-12 MED ORDER — DOXYCYCLINE HYCLATE 100 MG PO TABS: 100.0000 mg | ORAL_TABLET | Freq: Two times a day (BID) | ORAL | 0 refills | Status: AC

## 2023-02-12 MED ORDER — ALPRAZOLAM 1 MG PO TABS: 1.0000 mg | ORAL_TABLET | Freq: Every day | ORAL | 1 refills | Status: AC

## 2023-02-12 MED ORDER — NAPROXEN 500 MG PO TABS: 500.0000 mg | ORAL_TABLET | Freq: Two times a day (BID) | ORAL | 3 refills | Status: DC

## 2023-02-12 NOTE — Progress Notes (Unsigned)
   Subjective:   Patient ID: [name] [name], female    DOB: [birth year], 67 y.o.   MRN: [id number]  HPI The patient is a 67 YO female coming in for ongoing issues with hoarseness. Cough is improved but not gone since our last visit about 1 month ago. Prednisone course did not change hoarseness. Was supposed to see ENT but they do not take her insurance.   Review of Systems  Constitutional: Negative.   HENT:  Positive for sore throat and voice change.   Eyes: Negative.   Respiratory:  Negative for cough, chest tightness and shortness of breath.   Cardiovascular:  Negative for chest pain, palpitations and leg swelling.  Gastrointestinal:  Negative for abdominal distention, abdominal pain, constipation, diarrhea, nausea and vomiting.  Musculoskeletal: Negative.   Skin: Negative.   Neurological: Negative.   Psychiatric/Behavioral: Negative.      Objective:  Physical Exam Constitutional:      Appearance: She is well-developed.  HENT:     Head: Normocephalic and atraumatic.     Comments: Oropharynx with redness and clear drainage, nose with swollen turbinates, TMs normal bilaterally.  Neck:     Thyroid: No thyromegaly.  Cardiovascular:     Rate and Rhythm: Normal rate and regular rhythm.  Pulmonary:     Effort: Pulmonary effort is normal. No respiratory distress.     Breath sounds: Normal breath sounds. No wheezing or rales.  Abdominal:     Palpations: Abdomen is soft.  Musculoskeletal:        General: No tenderness.     Cervical back: Normal range of motion.  Lymphadenopathy:     Cervical: No cervical adenopathy.  Skin:    General: Skin is warm and dry.  Neurological:     Mental Status: She is alert and oriented to person, place, and time.     Vitals:   02/12/23 1539  BP: 116/82  Pulse: 80  Temp: 98.1 F (36.7 C)  TempSrc: Oral  SpO2: 95%  Weight: 230 lb 2 oz (104.4 kg)  Height: 5\' 1"  (1.549 m)    Assessment & Plan:

## 2023-02-12 NOTE — Patient Instructions (Signed)
We have sent in doxycycline to take 1 pill twice a day for 1 week.  

## 2023-02-12 NOTE — Telephone Encounter (Signed)
Pt stating she does not want medication refills from our office send to [facility] at all.

## 2023-02-13 ENCOUNTER — Encounter: Payer: Self-pay | Admitting: Internal Medicine

## 2023-02-13 NOTE — Assessment & Plan Note (Signed)
Takes alprazolam 1 mg qhs and needs refill today. Refill done.

## 2023-02-13 NOTE — Assessment & Plan Note (Signed)
She feels that prior treatment she had received cured this problem previously doxycycline. Rx 1 week course as she has had persistent sore throat and hoarseness for about 2 months. There is redness on exam no lymphadenopathy on exam. She has previously taken course of prednisone end of March without change or relief in symptoms. She is taking flonase and otc allergy with persistent symptoms.

## 2023-02-13 NOTE — Assessment & Plan Note (Signed)
Improving and did not cough during visit at all. Suspect this was post viral. CXR reviewed with her from last visit. Prednisone course did help improve her cough.

## 2023-02-15 ENCOUNTER — Other Ambulatory Visit: Payer: Self-pay | Admitting: Internal Medicine

## 2023-02-15 DIAGNOSIS — E669 Obesity, unspecified: Secondary | ICD-10-CM

## 2023-02-25 NOTE — Telephone Encounter (Signed)
Unable to accept pt at this time. 

## 2023-02-26 NOTE — Telephone Encounter (Signed)
Lmom for pt to call back. 

## 2023-02-27 NOTE — Telephone Encounter (Signed)
Pt is aware.  

## 2023-03-07 DIAGNOSIS — L439 Lichen planus, unspecified: Secondary | ICD-10-CM | POA: Diagnosis not present

## 2023-03-07 DIAGNOSIS — L438 Other lichen planus: Secondary | ICD-10-CM | POA: Diagnosis not present

## 2023-03-07 DIAGNOSIS — B370 Candidal stomatitis: Secondary | ICD-10-CM | POA: Diagnosis not present

## 2023-03-14 ENCOUNTER — Other Ambulatory Visit: Payer: Self-pay | Admitting: Internal Medicine

## 2023-04-02 ENCOUNTER — Ambulatory Visit: Payer: 59 | Admitting: Internal Medicine

## 2023-04-02 NOTE — Progress Notes (Signed)
Error

## 2023-04-26 DIAGNOSIS — E113293 Type 2 diabetes mellitus with mild nonproliferative diabetic retinopathy without macular edema, bilateral: Secondary | ICD-10-CM | POA: Diagnosis not present

## 2023-05-01 ENCOUNTER — Other Ambulatory Visit: Payer: Self-pay | Admitting: Internal Medicine

## 2023-05-03 DIAGNOSIS — K219 Gastro-esophageal reflux disease without esophagitis: Secondary | ICD-10-CM | POA: Diagnosis not present

## 2023-05-03 DIAGNOSIS — R490 Dysphonia: Secondary | ICD-10-CM | POA: Diagnosis not present

## 2023-05-09 DIAGNOSIS — R490 Dysphonia: Secondary | ICD-10-CM | POA: Diagnosis not present

## 2023-05-14 ENCOUNTER — Ambulatory Visit (INDEPENDENT_AMBULATORY_CARE_PROVIDER_SITE_OTHER): Payer: 59 | Admitting: Internal Medicine

## 2023-05-14 ENCOUNTER — Encounter: Payer: Self-pay | Admitting: Internal Medicine

## 2023-05-14 VITALS — BP 140/80 | HR 71 | Temp 98.3°F | Ht 61.0 in | Wt 233.0 lb

## 2023-05-14 DIAGNOSIS — G894 Chronic pain syndrome: Secondary | ICD-10-CM | POA: Diagnosis not present

## 2023-05-14 DIAGNOSIS — E119 Type 2 diabetes mellitus without complications: Secondary | ICD-10-CM

## 2023-05-14 DIAGNOSIS — Z0000 Encounter for general adult medical examination without abnormal findings: Secondary | ICD-10-CM

## 2023-05-14 DIAGNOSIS — R052 Subacute cough: Secondary | ICD-10-CM | POA: Diagnosis not present

## 2023-05-14 DIAGNOSIS — I10 Essential (primary) hypertension: Secondary | ICD-10-CM | POA: Diagnosis not present

## 2023-05-14 DIAGNOSIS — E785 Hyperlipidemia, unspecified: Secondary | ICD-10-CM

## 2023-05-14 DIAGNOSIS — E133299 Other specified diabetes mellitus with mild nonproliferative diabetic retinopathy without macular edema, unspecified eye: Secondary | ICD-10-CM

## 2023-05-14 DIAGNOSIS — C541 Malignant neoplasm of endometrium: Secondary | ICD-10-CM

## 2023-05-14 DIAGNOSIS — E1169 Type 2 diabetes mellitus with other specified complication: Secondary | ICD-10-CM

## 2023-05-14 DIAGNOSIS — F411 Generalized anxiety disorder: Secondary | ICD-10-CM

## 2023-05-14 DIAGNOSIS — E118 Type 2 diabetes mellitus with unspecified complications: Secondary | ICD-10-CM

## 2023-05-14 LAB — COMPREHENSIVE METABOLIC PANEL
ALT: 10 U/L (ref 0–35)
AST: 17 U/L (ref 0–37)
Albumin: 3.9 g/dL (ref 3.5–5.2)
Alkaline Phosphatase: 127 U/L — ABNORMAL HIGH (ref 39–117)
BUN: 12 mg/dL (ref 6–23)
CO2: 30 mEq/L (ref 19–32)
Calcium: 9.5 mg/dL (ref 8.4–10.5)
Chloride: 100 mEq/L (ref 96–112)
Creatinine, Ser: 0.9 mg/dL (ref 0.40–1.20)
GFR: 65.87 mL/min (ref >60.00)
Glucose, Bld: 140 mg/dL — ABNORMAL HIGH (ref 70–99)
Potassium: 4.1 mEq/L (ref 3.5–5.1)
Sodium: 136 mEq/L (ref 135–145)
Total Bilirubin: 0.5 mg/dL (ref 0.2–1.2)
Total Protein: 7.5 g/dL (ref 6.0–8.3)

## 2023-05-14 LAB — MICROALBUMIN / CREATININE URINE RATIO
Creatinine,U: 118 mg/dL
Microalb Creat Ratio: 0.6 mg/g (ref 0.0–30.0)
Microalb, Ur: <0.7 mg/dL (ref 0.0–1.9)

## 2023-05-14 LAB — HEMOGLOBIN A1C: Hgb A1c MFr Bld: 7.8 % — ABNORMAL HIGH (ref 4.6–6.5)

## 2023-05-14 LAB — CBC
HCT: 34 % — ABNORMAL LOW (ref 36.0–46.0)
Hemoglobin: 11 g/dL — ABNORMAL LOW (ref 12.0–15.0)
MCHC: 32.3 g/dL (ref 30.0–36.0)
MCV: 88.2 fl (ref 78.0–100.0)
Platelets: 286 10*3/uL (ref 150.0–400.0)
RBC: 3.86 Mil/uL — ABNORMAL LOW (ref 3.87–5.11)
RDW: 16.2 % — ABNORMAL HIGH (ref 11.5–15.5)
WBC: 7.3 10*3/uL (ref 4.0–10.5)

## 2023-05-14 LAB — LIPID PANEL
Cholesterol: 110 mg/dL (ref 0–200)
HDL: 41.2 mg/dL (ref >39.00)
LDL Cholesterol: 48 mg/dL (ref 0–99)
NonHDL: 68.93
Total CHOL/HDL Ratio: 3
Triglycerides: 105 mg/dL (ref 0.0–149.0)
VLDL: 21 mg/dL (ref 0.0–40.0)

## 2023-05-14 MED ORDER — ROSUVASTATIN CALCIUM 10 MG PO TABS: 10.0000 mg | ORAL_TABLET | Freq: Every day | ORAL | 3 refills | Status: AC

## 2023-05-14 MED ORDER — ACCU-CHEK GUIDE VI STRP
ORAL_STRIP | 11 refills | Status: AC
Start: 2023-05-14 — End: ?

## 2023-05-14 MED ORDER — LOSARTAN POTASSIUM 50 MG PO TABS: 50.0000 mg | ORAL_TABLET | Freq: Every day | ORAL | 3 refills | Status: AC

## 2023-05-14 MED ORDER — FLUTICASONE PROPIONATE 50 MCG/ACT NA SUSP: NASAL | 3 refills | Status: DC

## 2023-05-14 MED ORDER — POLYETHYLENE GLYCOL 3350 17 GM/SCOOP PO POWD: ORAL | 6 refills | Status: AC

## 2023-05-14 MED ORDER — SPIRONOLACTONE 25 MG PO TABS: 25.0000 mg | ORAL_TABLET | Freq: Every morning | ORAL | 3 refills | Status: DC

## 2023-05-14 MED ORDER — ALBUTEROL SULFATE HFA 108 (90 BASE) MCG/ACT IN AERS: 2.0000 | INHALATION_SPRAY | RESPIRATORY_TRACT | 0 refills | Status: DC | PRN

## 2023-05-14 MED ORDER — AMLODIPINE BESYLATE 10 MG PO TABS: 10.0000 mg | ORAL_TABLET | Freq: Every morning | ORAL | 3 refills | Status: DC

## 2023-05-14 MED ORDER — BD SWAB SINGLE USE REGULAR PADS: 200.0000 | MEDICATED_PAD | Freq: Two times a day (BID) | 3 refills | Status: AC

## 2023-05-14 MED ORDER — NAPROXEN 500 MG PO TABS: 500.0000 mg | ORAL_TABLET | Freq: Two times a day (BID) | ORAL | 3 refills | Status: AC

## 2023-05-14 MED ORDER — METFORMIN HCL 500 MG PO TABS: 500.0000 mg | ORAL_TABLET | Freq: Two times a day (BID) | ORAL | 3 refills | Status: DC

## 2023-05-14 MED ORDER — OMEPRAZOLE 20 MG PO CPDR: 20.0000 mg | DELAYED_RELEASE_CAPSULE | Freq: Every day | ORAL | 3 refills | Status: AC

## 2023-05-14 MED ORDER — FUROSEMIDE 20 MG PO TABS: 20.0000 mg | ORAL_TABLET | Freq: Every day | ORAL | 3 refills | Status: AC | PRN

## 2023-05-14 MED ORDER — TRAMADOL HCL 50 MG PO TABS: 50.0000 mg | ORAL_TABLET | Freq: Every day | ORAL | 1 refills | Status: AC | PRN

## 2023-05-14 MED ORDER — TRIAMCINOLONE ACETONIDE 0.1 % EX CREA: TOPICAL_CREAM | CUTANEOUS | 3 refills | Status: AC

## 2023-05-14 NOTE — Progress Notes (Signed)
   Subjective:   Patient ID: [name] [name], female    DOB: 1956-01-09, 67 y.o.   MRN: [id number]  HPI The patient is here for physical.  PMH, [facility], social history reviewed and updated  Review of Systems  Constitutional: Negative.   HENT: Negative.    Eyes: Negative.   Respiratory:  Negative for cough, chest tightness and shortness of breath.   Cardiovascular:  Negative for chest pain, palpitations and leg swelling.  Gastrointestinal:  Negative for abdominal distention, abdominal pain, constipation, diarrhea, nausea and vomiting.  Musculoskeletal: Negative.   Skin: Negative.   Neurological: Negative.   Psychiatric/Behavioral: Negative.      Objective:  Physical Exam Constitutional:      Appearance: She is well-developed. She is obese.  HENT:     Head: Normocephalic and atraumatic.  Cardiovascular:     Rate and Rhythm: Normal rate and regular rhythm.  Pulmonary:     Effort: Pulmonary effort is normal. No respiratory distress.     Breath sounds: Normal breath sounds. No wheezing or rales.  Abdominal:     General: Bowel sounds are normal. There is no distension.     Palpations: Abdomen is soft.     Tenderness: There is no abdominal tenderness. There is no rebound.  Musculoskeletal:     Cervical back: Normal range of motion.  Skin:    General: Skin is warm and dry.     Comments: Foot exam done  Neurological:     Mental Status: She is alert and oriented to person, place, and time.     Coordination: Coordination normal.     Vitals:   05/14/23 1441 05/14/23 1444  BP: (!) 140/80 (!) 140/80  Pulse: 71   Temp: 98.3 F (36.8 C)   TempSrc: Oral   SpO2: 97%   Weight: 233 lb (105.7 kg)   Height: 5\' 1"  (1.549 m)     Assessment & Plan:

## 2023-05-18 NOTE — Assessment & Plan Note (Signed)
Uses rare tramadol refilled for #15 no refills.

## 2023-05-18 NOTE — Assessment & Plan Note (Signed)
Seeing ent and they are adjusting medications.

## 2023-05-18 NOTE — Assessment & Plan Note (Signed)
Checking lipid panel and adjust crestor 10 mg daily as needed. 

## 2023-05-18 NOTE — Assessment & Plan Note (Signed)
Uses xanax 1 mg at bedtime and refilled for #90 with 1 refill today. Reviewed [place] database no inappropriate fills.

## 2023-05-18 NOTE — Assessment & Plan Note (Signed)
BP borderline today. Continue spironolactone 25 mg daily and losartan 50 mg daily and amlodipine 10 mg daily. Checking CMP and CBC and adjust as needed.

## 2023-05-18 NOTE — Assessment & Plan Note (Signed)
BMI 44 and counseled about diet and exercise. Taking metformin to help.

## 2023-05-18 NOTE — Assessment & Plan Note (Signed)
Foot exam done. Checking HgA1c, microalbumin to creatinine ratio and CMP and lipid panel. Eye exam up to date. Taking metformin 500 mg BID and adjust as needed. ON statin and ARB.

## 2023-05-18 NOTE — Assessment & Plan Note (Signed)
S/P surgery which was curative. No new clinical symptoms to suggest recurrence.

## 2023-05-18 NOTE — Assessment & Plan Note (Signed)
Reminded to continue eye exam as advised by her eye doctor.

## 2023-05-18 NOTE — Assessment & Plan Note (Signed)
Flu shot yearly. Pneumonia complete. Shingrix complete. Tetanus due at pharmacy. Colonoscopy up to date. Mammogram up to date, pap smear up to date and dexa up to date. Counseled about sun safety and mole surveillance. Counseled about the dangers of distracted driving. Given 10 year screening recommendations.

## 2023-05-23 ENCOUNTER — Other Ambulatory Visit: Payer: Self-pay | Admitting: Internal Medicine

## 2023-05-25 ENCOUNTER — Other Ambulatory Visit: Payer: Self-pay | Admitting: Internal Medicine

## 2023-05-25 DIAGNOSIS — E113391 Type 2 diabetes mellitus with moderate nonproliferative diabetic retinopathy without macular edema, right eye: Secondary | ICD-10-CM

## 2023-06-13 DIAGNOSIS — L438 Other lichen planus: Secondary | ICD-10-CM | POA: Diagnosis not present

## 2023-06-13 DIAGNOSIS — Z5181 Encounter for therapeutic drug level monitoring: Secondary | ICD-10-CM | POA: Diagnosis not present

## 2023-06-13 DIAGNOSIS — L439 Lichen planus, unspecified: Secondary | ICD-10-CM | POA: Diagnosis not present

## 2023-06-27 ENCOUNTER — Other Ambulatory Visit: Payer: Self-pay | Admitting: Internal Medicine

## 2023-07-23 DIAGNOSIS — E785 Hyperlipidemia, unspecified: Secondary | ICD-10-CM | POA: Diagnosis not present

## 2023-07-23 DIAGNOSIS — F5109 Other insomnia not due to a substance or known physiological condition: Secondary | ICD-10-CM | POA: Diagnosis not present

## 2023-07-23 DIAGNOSIS — G894 Chronic pain syndrome: Secondary | ICD-10-CM | POA: Diagnosis not present

## 2023-07-23 DIAGNOSIS — K5909 Other constipation: Secondary | ICD-10-CM | POA: Diagnosis not present

## 2023-07-23 DIAGNOSIS — I10 Essential (primary) hypertension: Secondary | ICD-10-CM | POA: Diagnosis not present

## 2023-07-23 DIAGNOSIS — H409 Unspecified glaucoma: Secondary | ICD-10-CM | POA: Diagnosis not present

## 2023-07-23 DIAGNOSIS — M1711 Unilateral primary osteoarthritis, right knee: Secondary | ICD-10-CM | POA: Diagnosis not present

## 2023-07-23 DIAGNOSIS — E1169 Type 2 diabetes mellitus with other specified complication: Secondary | ICD-10-CM | POA: Diagnosis not present

## 2023-07-23 DIAGNOSIS — K219 Gastro-esophageal reflux disease without esophagitis: Secondary | ICD-10-CM | POA: Diagnosis not present

## 2023-07-24 ENCOUNTER — Telehealth: Payer: Self-pay

## 2023-07-24 ENCOUNTER — Ambulatory Visit: Payer: 59

## 2023-07-26 NOTE — Telephone Encounter (Signed)
error 

## 2023-08-03 ENCOUNTER — Other Ambulatory Visit (HOSPITAL_COMMUNITY): Payer: Self-pay | Admitting: Family Medicine

## 2023-08-03 DIAGNOSIS — Z1231 Encounter for screening mammogram for malignant neoplasm of breast: Secondary | ICD-10-CM

## 2023-08-13 DIAGNOSIS — H6121 Impacted cerumen, right ear: Secondary | ICD-10-CM | POA: Diagnosis not present

## 2023-08-13 DIAGNOSIS — Z570 Occupational exposure to noise: Secondary | ICD-10-CM | POA: Diagnosis not present

## 2023-08-28 DIAGNOSIS — H903 Sensorineural hearing loss, bilateral: Secondary | ICD-10-CM | POA: Diagnosis not present

## 2023-08-28 DIAGNOSIS — H6992 Unspecified Eustachian tube disorder, left ear: Secondary | ICD-10-CM | POA: Diagnosis not present

## 2023-09-03 DIAGNOSIS — Z13820 Encounter for screening for osteoporosis: Secondary | ICD-10-CM | POA: Diagnosis not present

## 2023-09-03 DIAGNOSIS — Z136 Encounter for screening for cardiovascular disorders: Secondary | ICD-10-CM | POA: Diagnosis not present

## 2023-09-03 DIAGNOSIS — Z1329 Encounter for screening for other suspected endocrine disorder: Secondary | ICD-10-CM | POA: Diagnosis not present

## 2023-09-03 DIAGNOSIS — Z1231 Encounter for screening mammogram for malignant neoplasm of breast: Secondary | ICD-10-CM | POA: Diagnosis not present

## 2023-09-03 DIAGNOSIS — E785 Hyperlipidemia, unspecified: Secondary | ICD-10-CM | POA: Diagnosis not present

## 2023-09-03 DIAGNOSIS — I10 Essential (primary) hypertension: Secondary | ICD-10-CM | POA: Diagnosis not present

## 2023-09-03 DIAGNOSIS — Z0001 Encounter for general adult medical examination with abnormal findings: Secondary | ICD-10-CM | POA: Diagnosis not present

## 2023-09-03 DIAGNOSIS — Z1211 Encounter for screening for malignant neoplasm of colon: Secondary | ICD-10-CM | POA: Diagnosis not present

## 2023-09-17 ENCOUNTER — Ambulatory Visit (HOSPITAL_COMMUNITY)
Admission: RE | Admit: 2023-09-17 | Discharge: 2023-09-17 | Disposition: A | Discharge status: Home / Self Care | Payer: 59 | Source: Ambulatory Visit | Attending: Family Medicine | Admitting: Family Medicine

## 2023-09-17 ENCOUNTER — Encounter (HOSPITAL_COMMUNITY): Payer: Self-pay

## 2023-09-17 DIAGNOSIS — Z1231 Encounter for screening mammogram for malignant neoplasm of breast: Secondary | ICD-10-CM | POA: Diagnosis present

## 2023-09-21 ENCOUNTER — Other Ambulatory Visit: Payer: Self-pay | Admitting: Internal Medicine

## 2023-09-27 ENCOUNTER — Other Ambulatory Visit: Payer: Self-pay | Admitting: Internal Medicine

## 2023-10-01 ENCOUNTER — Other Ambulatory Visit (HOSPITAL_COMMUNITY): Payer: Self-pay | Admitting: Adult Health

## 2023-10-01 DIAGNOSIS — Z13820 Encounter for screening for osteoporosis: Secondary | ICD-10-CM

## 2023-10-04 ENCOUNTER — Ambulatory Visit (HOSPITAL_COMMUNITY)
Admission: RE | Admit: 2023-10-04 | Discharge: 2023-10-04 | Disposition: A | Discharge status: Home / Self Care | Payer: 59 | Source: Ambulatory Visit | Attending: Adult Health | Admitting: Adult Health

## 2023-10-04 DIAGNOSIS — Z780 Asymptomatic menopausal state: Secondary | ICD-10-CM | POA: Diagnosis not present

## 2023-10-04 DIAGNOSIS — M85851 Other specified disorders of bone density and structure, right thigh: Secondary | ICD-10-CM | POA: Insufficient documentation

## 2023-10-04 DIAGNOSIS — Z13820 Encounter for screening for osteoporosis: Secondary | ICD-10-CM | POA: Insufficient documentation

## 2023-10-08 NOTE — Telephone Encounter (Signed)
Pt apparently has new PCP

## 2023-10-08 NOTE — Telephone Encounter (Signed)
Pt apparently changed PCP

## 2023-10-18 ENCOUNTER — Other Ambulatory Visit: Payer: Self-pay | Admitting: Internal Medicine

## 2023-11-12 ENCOUNTER — Ambulatory Visit: Payer: 59 | Admitting: Internal Medicine

## 2024-01-06 ENCOUNTER — Other Ambulatory Visit: Payer: Self-pay | Admitting: Internal Medicine

## 2024-02-18 ENCOUNTER — Other Ambulatory Visit: Payer: Self-pay | Admitting: Internal Medicine

## 2024-07-22 ENCOUNTER — Other Ambulatory Visit: Payer: Self-pay | Admitting: Internal Medicine

## 2024-08-11 ENCOUNTER — Other Ambulatory Visit (HOSPITAL_COMMUNITY): Payer: Self-pay | Admitting: Family Medicine

## 2024-08-11 ENCOUNTER — Other Ambulatory Visit (HOSPITAL_COMMUNITY): Payer: Self-pay | Admitting: Adult Health

## 2024-08-11 DIAGNOSIS — Z1231 Encounter for screening mammogram for malignant neoplasm of breast: Secondary | ICD-10-CM

## 2024-09-17 ENCOUNTER — Ambulatory Visit (HOSPITAL_COMMUNITY)
Admission: RE | Admit: 2024-09-17 | Discharge: 2024-09-17 | Disposition: A | Source: Ambulatory Visit | Attending: Family Medicine

## 2024-09-17 DIAGNOSIS — Z1231 Encounter for screening mammogram for malignant neoplasm of breast: Secondary | ICD-10-CM | POA: Insufficient documentation
